# Patient Record
Sex: Male | Born: 1935 | Race: Black or African American | Hispanic: No | Marital: Married | Smoking: Former smoker
Health system: Southern US, Community
[De-identification: ages and names within clinical notes are randomized; demographics above are authoritative.]

## PROBLEM LIST (undated history)

## (undated) DIAGNOSIS — E785 Hyperlipidemia, unspecified: Secondary | ICD-10-CM

## (undated) DIAGNOSIS — E119 Type 2 diabetes mellitus without complications: Secondary | ICD-10-CM

## (undated) DIAGNOSIS — K5289 Other specified noninfective gastroenteritis and colitis: Secondary | ICD-10-CM

## (undated) DIAGNOSIS — Z85038 Personal history of other malignant neoplasm of large intestine: Secondary | ICD-10-CM

## (undated) DIAGNOSIS — K8020 Calculus of gallbladder without cholecystitis without obstruction: Secondary | ICD-10-CM

## (undated) DIAGNOSIS — R509 Fever, unspecified: Secondary | ICD-10-CM

## (undated) DIAGNOSIS — N259 Disorder resulting from impaired renal tubular function, unspecified: Secondary | ICD-10-CM

## (undated) DIAGNOSIS — Z87442 Personal history of urinary calculi: Secondary | ICD-10-CM

## (undated) DIAGNOSIS — R1011 Right upper quadrant pain: Secondary | ICD-10-CM

## (undated) DIAGNOSIS — R10816 Epigastric abdominal tenderness: Secondary | ICD-10-CM

## (undated) DIAGNOSIS — D649 Anemia, unspecified: Secondary | ICD-10-CM

## (undated) DIAGNOSIS — H9190 Unspecified hearing loss, unspecified ear: Secondary | ICD-10-CM

## (undated) DIAGNOSIS — K222 Esophageal obstruction: Secondary | ICD-10-CM

## (undated) DIAGNOSIS — J984 Other disorders of lung: Secondary | ICD-10-CM

## (undated) DIAGNOSIS — I10 Essential (primary) hypertension: Secondary | ICD-10-CM

## (undated) DIAGNOSIS — K5900 Constipation, unspecified: Secondary | ICD-10-CM

## (undated) DIAGNOSIS — K219 Gastro-esophageal reflux disease without esophagitis: Secondary | ICD-10-CM

## (undated) DIAGNOSIS — M1710 Unilateral primary osteoarthritis, unspecified knee: Secondary | ICD-10-CM

## (undated) HISTORY — DX: Essential (primary) hypertension: I10

## (undated) HISTORY — DX: Hyperlipidemia, unspecified: E78.5

## (undated) HISTORY — DX: Epigastric abdominal tenderness: R10.816

## (undated) HISTORY — DX: Unspecified hearing loss, unspecified ear: H91.90

## (undated) HISTORY — DX: Esophageal obstruction: K22.2

## (undated) HISTORY — DX: Other specified noninfective gastroenteritis and colitis: K52.89

## (undated) HISTORY — DX: Unilateral primary osteoarthritis, unspecified knee: M17.10

## (undated) HISTORY — DX: Calculus of gallbladder without cholecystitis without obstruction: K80.20

## (undated) HISTORY — DX: Other disorders of lung: J98.4

## (undated) HISTORY — DX: Fever, unspecified: R50.9

## (undated) HISTORY — DX: Constipation, unspecified: K59.00

## (undated) HISTORY — DX: Type 2 diabetes mellitus without complications: E11.9

## (undated) HISTORY — PX: SMALL INTESTINE SURGERY: SHX150

## (undated) HISTORY — DX: Disorder resulting from impaired renal tubular function, unspecified: N25.9

## (undated) HISTORY — DX: Personal history of urinary calculi: Z87.442

## (undated) HISTORY — DX: Personal history of other malignant neoplasm of large intestine: Z85.038

## (undated) HISTORY — DX: Right upper quadrant pain: R10.11

## (undated) HISTORY — DX: Anemia, unspecified: D64.9

## (undated) HISTORY — DX: Gastro-esophageal reflux disease without esophagitis: K21.9

---

## 1997-12-25 ENCOUNTER — Emergency Department (HOSPITAL_COMMUNITY): Admission: EM | Admit: 1997-12-25 | Discharge: 1997-12-25 | Payer: Self-pay | Admitting: Emergency Medicine

## 1998-03-14 ENCOUNTER — Other Ambulatory Visit: Admission: RE | Admit: 1998-03-14 | Discharge: 1998-03-14 | Payer: Self-pay | Admitting: Family Medicine

## 1998-09-05 ENCOUNTER — Encounter: Payer: Self-pay | Admitting: Family Medicine

## 1998-09-05 ENCOUNTER — Ambulatory Visit (HOSPITAL_COMMUNITY): Admission: RE | Admit: 1998-09-05 | Discharge: 1998-09-05 | Payer: Self-pay | Admitting: Family Medicine

## 1998-09-28 ENCOUNTER — Inpatient Hospital Stay (HOSPITAL_COMMUNITY): Admission: EM | Admit: 1998-09-28 | Discharge: 1998-10-05 | Payer: Self-pay | Admitting: Gastroenterology

## 1998-09-28 ENCOUNTER — Encounter: Payer: Self-pay | Admitting: Gastroenterology

## 1998-10-11 ENCOUNTER — Encounter: Payer: Self-pay | Admitting: Emergency Medicine

## 1998-10-12 ENCOUNTER — Inpatient Hospital Stay (HOSPITAL_COMMUNITY): Admission: EM | Admit: 1998-10-12 | Discharge: 1998-10-14 | Payer: Self-pay | Admitting: Emergency Medicine

## 1998-10-13 ENCOUNTER — Encounter: Payer: Self-pay | Admitting: Neurology

## 1998-10-17 ENCOUNTER — Encounter (HOSPITAL_BASED_OUTPATIENT_CLINIC_OR_DEPARTMENT_OTHER): Payer: Self-pay | Admitting: General Surgery

## 1998-10-17 ENCOUNTER — Inpatient Hospital Stay (HOSPITAL_COMMUNITY): Admission: EM | Admit: 1998-10-17 | Discharge: 1998-10-21 | Payer: Self-pay | Admitting: *Deleted

## 1998-12-07 ENCOUNTER — Ambulatory Visit (HOSPITAL_COMMUNITY): Admission: RE | Admit: 1998-12-07 | Discharge: 1998-12-07 | Payer: Self-pay | Admitting: General Surgery

## 1998-12-07 ENCOUNTER — Encounter (HOSPITAL_BASED_OUTPATIENT_CLINIC_OR_DEPARTMENT_OTHER): Payer: Self-pay | Admitting: General Surgery

## 1999-09-28 ENCOUNTER — Ambulatory Visit (HOSPITAL_COMMUNITY): Admission: RE | Admit: 1999-09-28 | Discharge: 1999-09-28 | Payer: Self-pay | Admitting: General Surgery

## 2000-01-29 ENCOUNTER — Emergency Department (HOSPITAL_COMMUNITY): Admission: EM | Admit: 2000-01-29 | Discharge: 2000-01-29 | Payer: Self-pay | Admitting: Emergency Medicine

## 2000-02-19 ENCOUNTER — Ambulatory Visit (HOSPITAL_COMMUNITY): Admission: RE | Admit: 2000-02-19 | Discharge: 2000-02-19 | Payer: Self-pay | Admitting: Gastroenterology

## 2000-02-19 ENCOUNTER — Encounter (INDEPENDENT_AMBULATORY_CARE_PROVIDER_SITE_OTHER): Payer: Self-pay | Admitting: *Deleted

## 2001-06-29 ENCOUNTER — Emergency Department (HOSPITAL_COMMUNITY): Admission: EM | Admit: 2001-06-29 | Discharge: 2001-06-29 | Payer: Self-pay

## 2002-08-27 ENCOUNTER — Ambulatory Visit (HOSPITAL_COMMUNITY): Admission: RE | Admit: 2002-08-27 | Discharge: 2002-08-27 | Payer: Self-pay | Admitting: Family Medicine

## 2002-08-27 ENCOUNTER — Encounter: Payer: Self-pay | Admitting: Family Medicine

## 2003-04-05 ENCOUNTER — Ambulatory Visit (HOSPITAL_COMMUNITY): Admission: RE | Admit: 2003-04-05 | Discharge: 2003-04-05 | Payer: Self-pay | Admitting: Gastroenterology

## 2003-07-16 ENCOUNTER — Ambulatory Visit (HOSPITAL_COMMUNITY): Admission: RE | Admit: 2003-07-16 | Discharge: 2003-07-16 | Payer: Self-pay | Admitting: Oncology

## 2003-07-18 ENCOUNTER — Emergency Department (HOSPITAL_COMMUNITY): Admission: EM | Admit: 2003-07-18 | Discharge: 2003-07-18 | Payer: Self-pay | Admitting: Emergency Medicine

## 2003-10-31 ENCOUNTER — Inpatient Hospital Stay (HOSPITAL_COMMUNITY): Admission: EM | Admit: 2003-10-31 | Discharge: 2003-11-01 | Payer: Self-pay | Admitting: Emergency Medicine

## 2005-04-10 ENCOUNTER — Emergency Department (HOSPITAL_COMMUNITY): Admission: EM | Admit: 2005-04-10 | Discharge: 2005-04-10 | Payer: Self-pay | Admitting: Emergency Medicine

## 2006-02-17 ENCOUNTER — Emergency Department (HOSPITAL_COMMUNITY): Admission: EM | Admit: 2006-02-17 | Discharge: 2006-02-17 | Payer: Self-pay | Admitting: Family Medicine

## 2007-01-09 ENCOUNTER — Emergency Department (HOSPITAL_COMMUNITY): Admission: EM | Admit: 2007-01-09 | Discharge: 2007-01-09 | Payer: Self-pay | Admitting: Emergency Medicine

## 2007-02-20 ENCOUNTER — Ambulatory Visit: Payer: Self-pay | Admitting: Internal Medicine

## 2007-02-20 LAB — CONVERTED CEMR LAB
ALT: 10 units/L (ref 0–53)
Alkaline Phosphatase: 91 units/L (ref 39–117)
BUN: 27 mg/dL — ABNORMAL HIGH (ref 6–23)
Basophils Relative: 0.1 % (ref 0.0–1.0)
Bilirubin, Direct: 0.2 mg/dL (ref 0.0–0.3)
CO2: 28 meq/L (ref 19–32)
Creatinine, Ser: 1.7 mg/dL — ABNORMAL HIGH (ref 0.4–1.5)
Eosinophils Relative: 2.2 % (ref 0.0–5.0)
Glucose, Bld: 127 mg/dL — ABNORMAL HIGH (ref 70–99)
HCT: 40.2 % (ref 39.0–52.0)
HDL: 42 mg/dL (ref >39.0)
Hemoglobin: 13.1 g/dL (ref 13.0–17.0)
LDL Cholesterol: 130 mg/dL — ABNORMAL HIGH (ref 0–99)
Lymphocytes Relative: 25.2 % (ref 12.0–46.0)
Monocytes Absolute: 0.6 10*3/uL (ref 0.2–0.7)
Monocytes Relative: 12 % — ABNORMAL HIGH (ref 3.0–11.0)
Neutro Abs: 3 10*3/uL (ref 1.4–7.7)
Potassium: 4.1 meq/L (ref 3.5–5.1)
RDW: 12.2 % (ref 11.5–14.6)
Total Bilirubin: 0.9 mg/dL (ref 0.3–1.2)
Total Protein: 8.7 g/dL — ABNORMAL HIGH (ref 6.0–8.3)
VLDL: 15 mg/dL (ref 0–40)
WBC: 5 10*3/uL (ref 4.5–10.5)

## 2007-03-24 ENCOUNTER — Ambulatory Visit: Payer: Self-pay | Admitting: Internal Medicine

## 2007-03-24 DIAGNOSIS — E785 Hyperlipidemia, unspecified: Secondary | ICD-10-CM

## 2007-03-24 DIAGNOSIS — I10 Essential (primary) hypertension: Secondary | ICD-10-CM | POA: Insufficient documentation

## 2007-03-24 DIAGNOSIS — Z85038 Personal history of other malignant neoplasm of large intestine: Secondary | ICD-10-CM | POA: Insufficient documentation

## 2007-03-24 HISTORY — DX: Hyperlipidemia, unspecified: E78.5

## 2007-03-24 HISTORY — DX: Essential (primary) hypertension: I10

## 2007-03-24 HISTORY — DX: Personal history of other malignant neoplasm of large intestine: Z85.038

## 2007-03-24 LAB — CONVERTED CEMR LAB
Chloride: 103 meq/L (ref 96–112)
Creatinine, Ser: 1.7 mg/dL — ABNORMAL HIGH (ref 0.4–1.5)
Glucose, Bld: 129 mg/dL — ABNORMAL HIGH (ref 70–99)
Phosphorus: 2.5 mg/dL (ref 2.3–4.6)
Sodium: 141 meq/L (ref 135–145)

## 2007-05-15 ENCOUNTER — Ambulatory Visit: Payer: Self-pay | Admitting: Internal Medicine

## 2007-05-15 ENCOUNTER — Inpatient Hospital Stay (HOSPITAL_COMMUNITY): Admission: EM | Admit: 2007-05-15 | Discharge: 2007-05-28 | Payer: Self-pay | Admitting: Emergency Medicine

## 2007-05-16 ENCOUNTER — Ambulatory Visit: Payer: Self-pay | Admitting: Oncology

## 2007-05-23 ENCOUNTER — Encounter (INDEPENDENT_AMBULATORY_CARE_PROVIDER_SITE_OTHER): Payer: Self-pay | Admitting: Gastroenterology

## 2007-06-02 ENCOUNTER — Encounter: Payer: Self-pay | Admitting: Internal Medicine

## 2007-06-02 ENCOUNTER — Ambulatory Visit: Payer: Self-pay | Admitting: Internal Medicine

## 2007-06-02 DIAGNOSIS — K222 Esophageal obstruction: Secondary | ICD-10-CM

## 2007-06-02 DIAGNOSIS — J984 Other disorders of lung: Secondary | ICD-10-CM

## 2007-06-02 DIAGNOSIS — K219 Gastro-esophageal reflux disease without esophagitis: Secondary | ICD-10-CM | POA: Insufficient documentation

## 2007-06-02 DIAGNOSIS — D649 Anemia, unspecified: Secondary | ICD-10-CM

## 2007-06-02 DIAGNOSIS — K8020 Calculus of gallbladder without cholecystitis without obstruction: Secondary | ICD-10-CM | POA: Insufficient documentation

## 2007-06-02 DIAGNOSIS — N259 Disorder resulting from impaired renal tubular function, unspecified: Secondary | ICD-10-CM | POA: Insufficient documentation

## 2007-06-02 DIAGNOSIS — Z87442 Personal history of urinary calculi: Secondary | ICD-10-CM

## 2007-06-02 HISTORY — DX: Esophageal obstruction: K22.2

## 2007-06-02 HISTORY — DX: Calculus of gallbladder without cholecystitis without obstruction: K80.20

## 2007-06-02 HISTORY — DX: Gastro-esophageal reflux disease without esophagitis: K21.9

## 2007-06-02 HISTORY — DX: Disorder resulting from impaired renal tubular function, unspecified: N25.9

## 2007-06-02 HISTORY — DX: Personal history of urinary calculi: Z87.442

## 2007-06-02 HISTORY — DX: Anemia, unspecified: D64.9

## 2007-06-02 HISTORY — DX: Other disorders of lung: J98.4

## 2007-06-05 ENCOUNTER — Ambulatory Visit: Payer: Self-pay | Admitting: Internal Medicine

## 2007-06-12 ENCOUNTER — Encounter: Payer: Self-pay | Admitting: Internal Medicine

## 2007-06-12 ENCOUNTER — Ambulatory Visit: Payer: Self-pay | Admitting: Internal Medicine

## 2007-06-12 DIAGNOSIS — R1011 Right upper quadrant pain: Secondary | ICD-10-CM

## 2007-06-12 HISTORY — DX: Right upper quadrant pain: R10.11

## 2007-06-17 ENCOUNTER — Encounter: Admission: RE | Admit: 2007-06-17 | Discharge: 2007-06-17 | Payer: Self-pay | Admitting: Internal Medicine

## 2007-06-23 ENCOUNTER — Ambulatory Visit: Payer: Self-pay | Admitting: Oncology

## 2007-06-24 ENCOUNTER — Encounter: Payer: Self-pay | Admitting: Internal Medicine

## 2007-06-24 LAB — CBC WITH DIFFERENTIAL/PLATELET
Basophils Absolute: 0 10*3/uL (ref 0.0–0.1)
EOS%: 2.8 % (ref 0.0–7.0)
HCT: 33.6 % — ABNORMAL LOW (ref 38.7–49.9)
HGB: 11.5 g/dL — ABNORMAL LOW (ref 13.0–17.1)
LYMPH%: 11.9 % — ABNORMAL LOW (ref 14.0–48.0)
MCH: 31.5 pg (ref 28.0–33.4)
MCV: 92.3 fL (ref 81.6–98.0)
MONO%: 9.9 % (ref 0.0–13.0)
NEUT%: 75.1 % — ABNORMAL HIGH (ref 40.0–75.0)
Platelets: 349 10*3/uL (ref 145–400)
RDW: 13.2 % (ref 11.2–14.6)

## 2007-06-24 LAB — COMPREHENSIVE METABOLIC PANEL
ALT: <8 U/L (ref 0–53)
AST: 18 U/L (ref 0–37)
Albumin: 4.3 g/dL (ref 3.5–5.2)
BUN: 35 mg/dL — ABNORMAL HIGH (ref 6–23)
CO2: 25 mEq/L (ref 19–32)
Calcium: 9.8 mg/dL (ref 8.4–10.5)
Chloride: 99 mEq/L (ref 96–112)
Creatinine, Ser: 1.62 mg/dL — ABNORMAL HIGH (ref 0.40–1.50)
Potassium: 4 mEq/L (ref 3.5–5.3)

## 2007-06-24 LAB — CEA: CEA: 3.9 ng/mL (ref 0.0–5.0)

## 2007-06-25 ENCOUNTER — Ambulatory Visit (HOSPITAL_COMMUNITY): Admission: RE | Admit: 2007-06-25 | Discharge: 2007-06-25 | Payer: Self-pay | Admitting: Oncology

## 2007-06-30 ENCOUNTER — Encounter: Payer: Self-pay | Admitting: Internal Medicine

## 2007-07-21 ENCOUNTER — Ambulatory Visit: Payer: Self-pay | Admitting: Internal Medicine

## 2007-07-21 LAB — CONVERTED CEMR LAB
AST: 17 units/L (ref 0–37)
Basophils Absolute: 0.1 10*3/uL (ref 0.0–0.1)
CEA: 0.9 ng/mL (ref 0.0–5.0)
CO2: 33 meq/L — ABNORMAL HIGH (ref 19–32)
Chloride: 99 meq/L (ref 96–112)
Creatinine, Ser: 1.6 mg/dL — ABNORMAL HIGH (ref 0.4–1.5)
Eosinophils Absolute: 0.1 10*3/uL (ref 0.0–0.6)
GFR calc Af Amer: 54 mL/min
Glucose, Bld: 125 mg/dL — ABNORMAL HIGH (ref 70–99)
HCT: 34.1 % — ABNORMAL LOW (ref 39.0–52.0)
Hemoglobin: 11.2 g/dL — ABNORMAL LOW (ref 13.0–17.0)
Lymphocytes Relative: 19.2 % (ref 12.0–46.0)
MCHC: 32.9 g/dL (ref 30.0–36.0)
MCV: 94.2 fL (ref 78.0–100.0)
Monocytes Absolute: 0.7 10*3/uL (ref 0.2–0.7)
Neutro Abs: 3.8 10*3/uL (ref 1.4–7.7)
Neutrophils Relative %: 66.6 % (ref 43.0–77.0)
Sodium: 138 meq/L (ref 135–145)

## 2007-07-23 ENCOUNTER — Encounter: Payer: Self-pay | Admitting: Internal Medicine

## 2007-08-12 ENCOUNTER — Ambulatory Visit: Payer: Self-pay | Admitting: Oncology

## 2007-08-18 ENCOUNTER — Encounter: Payer: Self-pay | Admitting: Internal Medicine

## 2007-08-18 LAB — CBC WITH DIFFERENTIAL/PLATELET
BASO%: 0 % (ref 0.0–2.0)
EOS%: 5.2 % (ref 0.0–7.0)
LYMPH%: 28.6 % (ref 14.0–48.0)
MCHC: 33.4 g/dL (ref 32.0–35.9)
MCV: 93 fL (ref 81.6–98.0)
MONO%: 12.1 % (ref 0.0–13.0)
NEUT#: 2.5 10*3/uL (ref 1.5–6.5)
Platelets: 326 10*3/uL (ref 145–400)
RBC: 3.46 10*6/uL — ABNORMAL LOW (ref 4.20–5.71)
RDW: 13 % (ref 11.2–14.6)

## 2007-08-18 LAB — COMPREHENSIVE METABOLIC PANEL
ALT: <8 U/L (ref 0–53)
AST: 14 U/L (ref 0–37)
Albumin: 4 g/dL (ref 3.5–5.2)
Alkaline Phosphatase: 57 U/L (ref 39–117)
Potassium: 4.1 mEq/L (ref 3.5–5.3)
Sodium: 139 mEq/L (ref 135–145)
Total Bilirubin: 0.5 mg/dL (ref 0.3–1.2)
Total Protein: 8.6 g/dL — ABNORMAL HIGH (ref 6.0–8.3)

## 2007-09-10 ENCOUNTER — Encounter: Payer: Self-pay | Admitting: Internal Medicine

## 2007-09-10 LAB — COMPREHENSIVE METABOLIC PANEL
Alkaline Phosphatase: 74 U/L (ref 39–117)
BUN: 14 mg/dL (ref 6–23)
CO2: 25 mEq/L (ref 19–32)
Creatinine, Ser: 1.4 mg/dL (ref 0.40–1.50)
Glucose, Bld: 105 mg/dL — ABNORMAL HIGH (ref 70–99)
Total Bilirubin: 0.7 mg/dL (ref 0.3–1.2)

## 2007-09-10 LAB — CBC WITH DIFFERENTIAL/PLATELET
Basophils Absolute: 0.1 10*3/uL (ref 0.0–0.1)
Eosinophils Absolute: 0.2 10*3/uL (ref 0.0–0.5)
LYMPH%: 30.1 % (ref 14.0–48.0)
MCH: 31.9 pg (ref 28.0–33.4)
MONO%: 12.8 % (ref 0.0–13.0)
NEUT%: 50.8 % (ref 40.0–75.0)
RBC: 3.61 10*6/uL — ABNORMAL LOW (ref 4.20–5.71)
WBC: 4.2 10*3/uL (ref 4.0–10.0)

## 2007-09-10 LAB — LACTATE DEHYDROGENASE: LDH: 199 U/L (ref 94–250)

## 2007-09-10 LAB — CEA: CEA: 0.5 ng/mL (ref 0.0–5.0)

## 2007-09-12 ENCOUNTER — Ambulatory Visit: Payer: Self-pay | Admitting: Internal Medicine

## 2007-09-22 ENCOUNTER — Ambulatory Visit: Payer: Self-pay | Admitting: Oncology

## 2007-09-24 LAB — CBC WITH DIFFERENTIAL/PLATELET
BASO%: 0.1 % (ref 0.0–2.0)
EOS%: 3.4 % (ref 0.0–7.0)
LYMPH%: 22.6 % (ref 14.0–48.0)
MCH: 31.9 pg (ref 28.0–33.4)
MCHC: 33.1 g/dL (ref 32.0–35.9)
MCV: 96.2 fL (ref 81.6–98.0)
MONO%: 12 % (ref 0.0–13.0)
Platelets: 268 10*3/uL (ref 145–400)
RBC: 3.61 10*6/uL — ABNORMAL LOW (ref 4.20–5.71)

## 2007-10-02 ENCOUNTER — Ambulatory Visit: Payer: Self-pay | Admitting: Internal Medicine

## 2007-10-02 LAB — CONVERTED CEMR LAB
BUN: 26 mg/dL — ABNORMAL HIGH (ref 6–23)
Basophils Relative: 0.3 % (ref 0.0–1.0)
Chloride: 104 meq/L (ref 96–112)
Creatinine, Ser: 1.8 mg/dL — ABNORMAL HIGH (ref 0.4–1.5)
Eosinophils Relative: 4.2 % (ref 0.0–5.0)
Folate: >20 ng/mL
Hgb A1c MFr Bld: 6.3 % — ABNORMAL HIGH (ref 4.6–6.0)
Iron: 75 ug/dL (ref 42–165)
Lymphocytes Relative: 29.8 % (ref 12.0–46.0)
Neutro Abs: 1.9 10*3/uL (ref 1.4–7.7)
Platelets: 215 10*3/uL (ref 150–400)
Transferrin: 170.6 mg/dL — ABNORMAL LOW (ref >=212.0)
WBC: 3.8 10*3/uL — ABNORMAL LOW (ref 4.5–10.5)

## 2007-10-10 ENCOUNTER — Encounter: Payer: Self-pay | Admitting: Internal Medicine

## 2007-10-10 LAB — COMPREHENSIVE METABOLIC PANEL
ALT: <8 U/L (ref 0–53)
CO2: 23 mEq/L (ref 19–32)
Sodium: 140 mEq/L (ref 135–145)
Total Bilirubin: 0.7 mg/dL (ref 0.3–1.2)
Total Protein: 8.4 g/dL — ABNORMAL HIGH (ref 6.0–8.3)

## 2007-10-10 LAB — CBC WITH DIFFERENTIAL/PLATELET
BASO%: 0.2 % (ref 0.0–2.0)
LYMPH%: 13.1 % — ABNORMAL LOW (ref 14.0–48.0)
MCHC: 34.2 g/dL (ref 32.0–35.9)
MONO#: 0.6 10*3/uL (ref 0.1–0.9)
Platelets: 215 10*3/uL (ref 145–400)
RBC: 3.41 10*6/uL — ABNORMAL LOW (ref 4.20–5.71)
WBC: 7.7 10*3/uL (ref 4.0–10.0)
lymph#: 1 10*3/uL (ref 0.9–3.3)

## 2007-10-10 LAB — LACTATE DEHYDROGENASE: LDH: 159 U/L (ref 94–250)

## 2007-10-20 ENCOUNTER — Encounter: Payer: Self-pay | Admitting: Internal Medicine

## 2007-10-21 ENCOUNTER — Ambulatory Visit (HOSPITAL_COMMUNITY): Admission: RE | Admit: 2007-10-21 | Discharge: 2007-10-21 | Payer: Self-pay | Admitting: Oncology

## 2007-11-14 ENCOUNTER — Ambulatory Visit: Payer: Self-pay | Admitting: Oncology

## 2007-12-02 LAB — CBC WITH DIFFERENTIAL/PLATELET
Basophils Absolute: 0 10*3/uL (ref 0.0–0.1)
Eosinophils Absolute: 0.2 10*3/uL (ref 0.0–0.5)
HCT: 34.6 % — ABNORMAL LOW (ref 38.7–49.9)
HGB: 11.6 g/dL — ABNORMAL LOW (ref 13.0–17.1)
LYMPH%: 24.1 % (ref 14.0–48.0)
MCV: 102.3 fL — ABNORMAL HIGH (ref 81.6–98.0)
MONO%: 12 % (ref 0.0–13.0)
NEUT#: 3.1 10*3/uL (ref 1.5–6.5)
NEUT%: 60.4 % (ref 40.0–75.0)
Platelets: 208 10*3/uL (ref 145–400)
RDW: 19.1 % — ABNORMAL HIGH (ref 11.2–14.6)

## 2007-12-15 LAB — CBC WITH DIFFERENTIAL/PLATELET
Eosinophils Absolute: 0.1 10*3/uL (ref 0.0–0.5)
LYMPH%: 24.2 % (ref 14.0–48.0)
MCV: 102.3 fL — ABNORMAL HIGH (ref 81.6–98.0)
MONO%: 14 % — ABNORMAL HIGH (ref 0.0–13.0)
NEUT#: 3 10*3/uL (ref 1.5–6.5)
Platelets: 197 10*3/uL (ref 145–400)
RBC: 3.31 10*6/uL — ABNORMAL LOW (ref 4.20–5.71)

## 2007-12-23 ENCOUNTER — Encounter: Payer: Self-pay | Admitting: Internal Medicine

## 2007-12-23 ENCOUNTER — Ambulatory Visit (HOSPITAL_COMMUNITY): Admission: RE | Admit: 2007-12-23 | Discharge: 2007-12-23 | Payer: Self-pay | Admitting: Oncology

## 2007-12-24 ENCOUNTER — Ambulatory Visit: Payer: Self-pay | Admitting: Internal Medicine

## 2007-12-24 DIAGNOSIS — E119 Type 2 diabetes mellitus without complications: Secondary | ICD-10-CM

## 2007-12-24 HISTORY — DX: Type 2 diabetes mellitus without complications: E11.9

## 2007-12-30 ENCOUNTER — Ambulatory Visit: Payer: Self-pay | Admitting: Oncology

## 2007-12-30 LAB — CBC WITH DIFFERENTIAL/PLATELET
Basophils Absolute: 0 10*3/uL (ref 0.0–0.1)
Eosinophils Absolute: 0.2 10*3/uL (ref 0.0–0.5)
HGB: 12.1 g/dL — ABNORMAL LOW (ref 13.0–17.1)
MONO#: 0.6 10*3/uL (ref 0.1–0.9)
NEUT#: 3.2 10*3/uL (ref 1.5–6.5)
RDW: 15.1 % — ABNORMAL HIGH (ref 11.2–14.6)
lymph#: 1.3 10*3/uL (ref 0.9–3.3)

## 2008-01-13 ENCOUNTER — Encounter: Payer: Self-pay | Admitting: Internal Medicine

## 2008-01-26 ENCOUNTER — Ambulatory Visit: Payer: Self-pay | Admitting: Internal Medicine

## 2008-01-26 DIAGNOSIS — M1710 Unilateral primary osteoarthritis, unspecified knee: Secondary | ICD-10-CM

## 2008-01-26 DIAGNOSIS — IMO0002 Reserved for concepts with insufficient information to code with codable children: Secondary | ICD-10-CM | POA: Insufficient documentation

## 2008-01-26 HISTORY — DX: Reserved for concepts with insufficient information to code with codable children: IMO0002

## 2008-01-26 LAB — CBC WITH DIFFERENTIAL/PLATELET
BASO%: 0.7 % (ref 0.0–2.0)
EOS%: 1.6 % (ref 0.0–7.0)
HCT: 35.9 % — ABNORMAL LOW (ref 38.7–49.9)
MCH: 33.7 pg — ABNORMAL HIGH (ref 28.0–33.4)
MCHC: 34.3 g/dL (ref 32.0–35.9)
NEUT%: 56.9 % (ref 40.0–75.0)
RBC: 3.65 10*6/uL — ABNORMAL LOW (ref 4.20–5.71)
WBC: 5.7 10*3/uL (ref 4.0–10.0)
lymph#: 1.6 10*3/uL (ref 0.9–3.3)

## 2008-02-10 LAB — CBC WITH DIFFERENTIAL/PLATELET
BASO%: 0.3 % (ref 0.0–2.0)
Basophils Absolute: 0 10*3/uL (ref 0.0–0.1)
EOS%: 1.8 % (ref 0.0–7.0)
HGB: 11.8 g/dL — ABNORMAL LOW (ref 13.0–17.1)
MCH: 34.1 pg — ABNORMAL HIGH (ref 28.0–33.4)
MCHC: 34.8 g/dL (ref 32.0–35.9)
MONO#: 0.6 10*3/uL (ref 0.1–0.9)
RDW: 14.7 % — ABNORMAL HIGH (ref 11.2–14.6)
WBC: 4.6 10*3/uL (ref 4.0–10.0)
lymph#: 1.4 10*3/uL (ref 0.9–3.3)

## 2008-02-18 ENCOUNTER — Ambulatory Visit: Payer: Self-pay | Admitting: Oncology

## 2008-02-23 ENCOUNTER — Encounter: Payer: Self-pay | Admitting: Internal Medicine

## 2008-02-23 LAB — CBC WITH DIFFERENTIAL/PLATELET
Eosinophils Absolute: 0.1 10*3/uL (ref 0.0–0.5)
HCT: 34.7 % — ABNORMAL LOW (ref 38.7–49.9)
LYMPH%: 19.8 % (ref 14.0–48.0)
MCV: 98.6 fL — ABNORMAL HIGH (ref 81.6–98.0)
MONO%: 11.2 % (ref 0.0–13.0)
NEUT#: 3.6 10*3/uL (ref 1.5–6.5)
NEUT%: 67.2 % (ref 40.0–75.0)
Platelets: 236 10*3/uL (ref 145–400)
RBC: 3.52 10*6/uL — ABNORMAL LOW (ref 4.20–5.71)

## 2008-02-23 LAB — COMPREHENSIVE METABOLIC PANEL
Alkaline Phosphatase: 74 U/L (ref 39–117)
BUN: 28 mg/dL — ABNORMAL HIGH (ref 6–23)
CO2: 23 mEq/L (ref 19–32)
Creatinine, Ser: 2.06 mg/dL — ABNORMAL HIGH (ref 0.40–1.50)
Glucose, Bld: 124 mg/dL — ABNORMAL HIGH (ref 70–99)
Sodium: 137 mEq/L (ref 135–145)
Total Bilirubin: 0.6 mg/dL (ref 0.3–1.2)
Total Protein: 8.3 g/dL (ref 6.0–8.3)

## 2008-02-23 LAB — LACTATE DEHYDROGENASE: LDH: 149 U/L (ref 94–250)

## 2008-02-24 ENCOUNTER — Ambulatory Visit (HOSPITAL_COMMUNITY): Admission: RE | Admit: 2008-02-24 | Discharge: 2008-02-24 | Payer: Self-pay | Admitting: Oncology

## 2008-03-08 LAB — CBC WITH DIFFERENTIAL/PLATELET
BASO%: 0.3 % (ref 0.0–2.0)
Basophils Absolute: 0 10*3/uL (ref 0.0–0.1)
Eosinophils Absolute: 0.1 10*3/uL (ref 0.0–0.5)
HCT: 33.4 % — ABNORMAL LOW (ref 38.7–49.9)
HGB: 11.3 g/dL — ABNORMAL LOW (ref 13.0–17.1)
LYMPH%: 26.4 % (ref 14.0–48.0)
MONO#: 0.7 10*3/uL (ref 0.1–0.9)
NEUT#: 2.9 10*3/uL (ref 1.5–6.5)
NEUT%: 58.5 % (ref 40.0–75.0)
Platelets: 200 10*3/uL (ref 145–400)
WBC: 4.9 10*3/uL (ref 4.0–10.0)
lymph#: 1.3 10*3/uL (ref 0.9–3.3)

## 2008-03-22 LAB — CBC WITH DIFFERENTIAL/PLATELET
BASO%: 0.2 % (ref 0.0–2.0)
EOS%: 3.4 % (ref 0.0–7.0)
MCH: 34.1 pg — ABNORMAL HIGH (ref 28.0–33.4)
MCV: 101.3 fL — ABNORMAL HIGH (ref 81.6–98.0)
MONO%: 14.6 % — ABNORMAL HIGH (ref 0.0–13.0)
NEUT#: 3.1 10*3/uL (ref 1.5–6.5)
RBC: 3.15 10*6/uL — ABNORMAL LOW (ref 4.20–5.71)
RDW: 15.9 % — ABNORMAL HIGH (ref 11.2–14.6)

## 2008-04-04 ENCOUNTER — Ambulatory Visit: Payer: Self-pay | Admitting: Oncology

## 2008-04-05 ENCOUNTER — Encounter: Payer: Self-pay | Admitting: Internal Medicine

## 2008-04-05 LAB — CBC WITH DIFFERENTIAL/PLATELET
Eosinophils Absolute: 0.1 10*3/uL (ref 0.0–0.5)
MONO#: 0.6 10*3/uL (ref 0.1–0.9)
MONO%: 14 % — ABNORMAL HIGH (ref 0.0–13.0)
NEUT#: 2.6 10*3/uL (ref 1.5–6.5)
RBC: 3.03 10*6/uL — ABNORMAL LOW (ref 4.20–5.71)
RDW: 17.8 % — ABNORMAL HIGH (ref 11.2–14.6)
WBC: 4.4 10*3/uL (ref 4.0–10.0)

## 2008-04-05 LAB — COMPREHENSIVE METABOLIC PANEL
ALT: <8 U/L (ref 0–53)
AST: 15 U/L (ref 0–37)
BUN: 27 mg/dL — ABNORMAL HIGH (ref 6–23)
CO2: 23 mEq/L (ref 19–32)
Calcium: 8.6 mg/dL (ref 8.4–10.5)
Creatinine, Ser: 2.24 mg/dL — ABNORMAL HIGH (ref 0.40–1.50)
Glucose, Bld: 134 mg/dL — ABNORMAL HIGH (ref 70–99)
Potassium: 3.8 mEq/L (ref 3.5–5.3)
Sodium: 139 mEq/L (ref 135–145)
Total Bilirubin: 0.7 mg/dL (ref 0.3–1.2)

## 2008-04-05 LAB — LACTATE DEHYDROGENASE: LDH: 146 U/L (ref 94–250)

## 2008-04-19 LAB — CBC WITH DIFFERENTIAL/PLATELET
Basophils Absolute: 0 10*3/uL (ref 0.0–0.1)
EOS%: 3.2 % (ref 0.0–7.0)
Eosinophils Absolute: 0.1 10*3/uL (ref 0.0–0.5)
HGB: 10.5 g/dL — ABNORMAL LOW (ref 13.0–17.1)
NEUT#: 2.7 10*3/uL (ref 1.5–6.5)
RDW: 19.4 % — ABNORMAL HIGH (ref 11.2–14.6)
lymph#: 1 10*3/uL (ref 0.9–3.3)

## 2008-04-19 LAB — BASIC METABOLIC PANEL
BUN: 33 mg/dL — ABNORMAL HIGH (ref 6–23)
Chloride: 103 mEq/L (ref 96–112)
Glucose, Bld: 115 mg/dL — ABNORMAL HIGH (ref 70–99)
Potassium: 4 mEq/L (ref 3.5–5.3)
Sodium: 139 mEq/L (ref 135–145)

## 2008-05-03 LAB — CBC WITH DIFFERENTIAL/PLATELET
Eosinophils Absolute: 0.1 10*3/uL (ref 0.0–0.5)
MONO#: 0.7 10*3/uL (ref 0.1–0.9)
MONO%: 14.4 % — ABNORMAL HIGH (ref 0.0–13.0)
NEUT#: 3.1 10*3/uL (ref 1.5–6.5)
RBC: 2.88 10*6/uL — ABNORMAL LOW (ref 4.20–5.71)
RDW: 21.4 % — ABNORMAL HIGH (ref 11.2–14.6)
WBC: 5 10*3/uL (ref 4.0–10.0)
lymph#: 1.1 10*3/uL (ref 0.9–3.3)

## 2008-05-17 ENCOUNTER — Encounter: Payer: Self-pay | Admitting: Internal Medicine

## 2008-05-17 LAB — CBC WITH DIFFERENTIAL/PLATELET
Eosinophils Absolute: 0.1 10*3/uL (ref 0.0–0.5)
HCT: 30.7 % — ABNORMAL LOW (ref 38.7–49.9)
HGB: 10.3 g/dL — ABNORMAL LOW (ref 13.0–17.1)
LYMPH%: 27.7 % (ref 14.0–48.0)
MONO#: 0.5 10*3/uL (ref 0.1–0.9)
NEUT#: 2.4 10*3/uL (ref 1.5–6.5)
Platelets: 247 10*3/uL (ref 145–400)
RBC: 2.84 10*6/uL — ABNORMAL LOW (ref 4.20–5.71)
WBC: 4.2 10*3/uL (ref 4.0–10.0)

## 2008-05-17 LAB — COMPREHENSIVE METABOLIC PANEL
Albumin: 4.6 g/dL (ref 3.5–5.2)
CO2: 25 mEq/L (ref 19–32)
Glucose, Bld: 134 mg/dL — ABNORMAL HIGH (ref 70–99)
Potassium: 3.9 mEq/L (ref 3.5–5.3)
Sodium: 139 mEq/L (ref 135–145)
Total Bilirubin: 0.8 mg/dL (ref 0.3–1.2)
Total Protein: 8.2 g/dL (ref 6.0–8.3)

## 2008-05-27 ENCOUNTER — Ambulatory Visit: Payer: Self-pay | Admitting: Oncology

## 2008-05-31 LAB — CBC WITH DIFFERENTIAL/PLATELET
Eosinophils Absolute: 0.1 10*3/uL (ref 0.0–0.5)
LYMPH%: 24.4 % (ref 14.0–48.0)
MONO#: 0.5 10*3/uL (ref 0.1–0.9)
NEUT#: 2.8 10*3/uL (ref 1.5–6.5)
Platelets: 285 10*3/uL (ref 145–400)
RBC: 2.81 10*6/uL — ABNORMAL LOW (ref 4.20–5.71)
WBC: 4.6 10*3/uL (ref 4.0–10.0)
lymph#: 1.1 10*3/uL (ref 0.9–3.3)

## 2008-06-14 LAB — CBC WITH DIFFERENTIAL/PLATELET
BASO%: 0.5 % (ref 0.0–2.0)
EOS%: 3.4 % (ref 0.0–7.0)
HCT: 31.1 % — ABNORMAL LOW (ref 38.7–49.9)
HGB: 10.4 g/dL — ABNORMAL LOW (ref 13.0–17.1)
MCHC: 33.4 g/dL (ref 32.0–35.9)
MONO#: 0.7 10*3/uL (ref 0.1–0.9)
MONO%: 12.4 % (ref 0.0–13.0)
NEUT#: 3.7 10*3/uL (ref 1.5–6.5)
Platelets: 231 10*3/uL (ref 145–400)
WBC: 5.8 10*3/uL (ref 4.0–10.0)
lymph#: 1.2 10*3/uL (ref 0.9–3.3)

## 2008-06-22 ENCOUNTER — Ambulatory Visit (HOSPITAL_COMMUNITY): Admission: RE | Admit: 2008-06-22 | Discharge: 2008-06-22 | Payer: Self-pay | Admitting: Oncology

## 2008-06-28 ENCOUNTER — Encounter: Payer: Self-pay | Admitting: Internal Medicine

## 2008-06-28 LAB — CBC WITH DIFFERENTIAL/PLATELET
Basophils Absolute: 0 10*3/uL (ref 0.0–0.1)
Eosinophils Absolute: 0.2 10*3/uL (ref 0.0–0.5)
HCT: 30.3 % — ABNORMAL LOW (ref 38.7–49.9)
LYMPH%: 26.8 % (ref 14.0–48.0)
MCV: 112.1 fL — ABNORMAL HIGH (ref 81.6–98.0)
MONO#: 0.6 10*3/uL (ref 0.1–0.9)
NEUT#: 2.3 10*3/uL (ref 1.5–6.5)
NEUT%: 55.5 % (ref 40.0–75.0)
Platelets: 231 10*3/uL (ref 145–400)
WBC: 4.2 10*3/uL (ref 4.0–10.0)

## 2008-06-28 LAB — COMPREHENSIVE METABOLIC PANEL
BUN: 26 mg/dL — ABNORMAL HIGH (ref 6–23)
CO2: 27 mEq/L (ref 19–32)
Creatinine, Ser: 1.92 mg/dL — ABNORMAL HIGH (ref 0.40–1.50)
Glucose, Bld: 105 mg/dL — ABNORMAL HIGH (ref 70–99)
Sodium: 138 mEq/L (ref 135–145)
Total Bilirubin: 0.5 mg/dL (ref 0.3–1.2)
Total Protein: 7.7 g/dL (ref 6.0–8.3)

## 2008-06-28 LAB — LACTATE DEHYDROGENASE: LDH: 151 U/L (ref 94–250)

## 2008-06-28 LAB — CEA: CEA: 1.4 ng/mL (ref 0.0–5.0)

## 2008-07-12 ENCOUNTER — Ambulatory Visit: Payer: Self-pay | Admitting: Oncology

## 2008-07-12 LAB — CBC WITH DIFFERENTIAL/PLATELET
BASO%: 0.2 % (ref 0.0–2.0)
Eosinophils Absolute: 0.1 10*3/uL (ref 0.0–0.5)
MONO#: 0.6 10*3/uL (ref 0.1–0.9)
MONO%: 12.5 % (ref 0.0–13.0)
NEUT#: 2.5 10*3/uL (ref 1.5–6.5)
RBC: 2.95 10*6/uL — ABNORMAL LOW (ref 4.20–5.71)
RDW: 17.8 % — ABNORMAL HIGH (ref 11.2–14.6)
WBC: 4.5 10*3/uL (ref 4.0–10.0)

## 2008-07-26 ENCOUNTER — Encounter: Payer: Self-pay | Admitting: Internal Medicine

## 2008-07-28 ENCOUNTER — Ambulatory Visit: Payer: Self-pay | Admitting: Internal Medicine

## 2008-07-30 LAB — CONVERTED CEMR LAB
ALT: 9 units/L (ref 0–53)
AST: 19 units/L (ref 0–37)
Albumin: 4.1 g/dL (ref 3.5–5.2)
Alkaline Phosphatase: 65 units/L (ref 39–117)
BUN: 22 mg/dL (ref 6–23)
Basophils Absolute: 0 10*3/uL (ref 0.0–0.1)
Basophils Relative: 0.1 % (ref 0.0–3.0)
Bilirubin Urine: NEGATIVE
Bilirubin, Direct: 0.1 mg/dL (ref 0.0–0.3)
CO2: 28 meq/L (ref 19–32)
Calcium: 9.3 mg/dL (ref 8.4–10.5)
Chloride: 108 meq/L (ref 96–112)
Cholesterol: 100 mg/dL (ref 0–200)
Creatinine, Ser: 1.9 mg/dL — ABNORMAL HIGH (ref 0.4–1.5)
Eosinophils Absolute: 0.1 10*3/uL (ref 0.0–0.7)
Eosinophils Relative: 1.9 % (ref 0.0–5.0)
Folate: >20 ng/mL
GFR calc Af Amer: 44 mL/min
GFR calc non Af Amer: 36 mL/min
Glucose, Bld: 125 mg/dL — ABNORMAL HIGH (ref 70–99)
HCT: 32.5 % — ABNORMAL LOW (ref 39.0–52.0)
HDL: 32.7 mg/dL — ABNORMAL LOW (ref >39.0)
Hemoglobin, Urine: NEGATIVE
Hemoglobin: 10.9 g/dL — ABNORMAL LOW (ref 13.0–17.0)
Hgb A1c MFr Bld: 6 % (ref 4.6–6.0)
Iron: 92 ug/dL (ref 42–165)
Ketones, ur: NEGATIVE mg/dL
LDL Cholesterol: 55 mg/dL (ref 0–99)
Leukocytes, UA: NEGATIVE
Lymphocytes Relative: 23 % (ref 12.0–46.0)
MCHC: 33.6 g/dL (ref 30.0–36.0)
MCV: 111.8 fL — ABNORMAL HIGH (ref 78.0–100.0)
Monocytes Absolute: 0.7 10*3/uL (ref 0.1–1.0)
Monocytes Relative: 14.5 % — ABNORMAL HIGH (ref 3.0–12.0)
Neutro Abs: 2.7 10*3/uL (ref 1.4–7.7)
Neutrophils Relative %: 60.5 % (ref 43.0–77.0)
Nitrite: NEGATIVE
Platelets: 184 10*3/uL (ref 150–400)
Potassium: 4.6 meq/L (ref 3.5–5.1)
RBC: 2.91 M/uL — ABNORMAL LOW (ref 4.22–5.81)
RDW: 17.4 % — ABNORMAL HIGH (ref 11.5–14.6)
Saturation Ratios: 35.1 % (ref 20.0–50.0)
Sodium: 142 meq/L (ref 135–145)
Specific Gravity, Urine: 1.015 (ref 1.000–1.03)
TSH: 3.27 microintl units/mL (ref 0.35–5.50)
Total Bilirubin: 0.6 mg/dL (ref 0.3–1.2)
Total CHOL/HDL Ratio: 3.1
Total Protein, Urine: NEGATIVE mg/dL
Total Protein: 7.8 g/dL (ref 6.0–8.3)
Transferrin: 187.2 mg/dL — ABNORMAL LOW (ref >=212.0)
Triglycerides: 62 mg/dL (ref 0–149)
Urine Glucose: >=1000 mg/dL — CR
Urobilinogen, UA: 1 (ref 0.0–1.0)
VLDL: 12 mg/dL (ref 0–40)
Vitamin B-12: 205 pg/mL — ABNORMAL LOW (ref 211–911)
WBC: 4.5 10*3/uL (ref 4.5–10.5)
pH: 6.5 (ref 5.0–8.0)

## 2008-08-09 LAB — CBC WITH DIFFERENTIAL/PLATELET
Basophils Absolute: 0 10*3/uL (ref 0.0–0.1)
Eosinophils Absolute: 0.1 10*3/uL (ref 0.0–0.5)
HGB: 11.5 g/dL — ABNORMAL LOW (ref 13.0–17.1)
MONO#: 0.5 10*3/uL (ref 0.1–0.9)
NEUT#: 3.1 10*3/uL (ref 1.5–6.5)
Platelets: 219 10*3/uL (ref 145–400)
RBC: 3.05 10*6/uL — ABNORMAL LOW (ref 4.20–5.71)
RDW: 18 % — ABNORMAL HIGH (ref 11.2–14.6)
WBC: 4.8 10*3/uL (ref 4.0–10.0)

## 2008-08-23 ENCOUNTER — Encounter: Payer: Self-pay | Admitting: Internal Medicine

## 2008-08-23 ENCOUNTER — Ambulatory Visit: Payer: Self-pay | Admitting: Oncology

## 2008-08-23 LAB — CBC WITH DIFFERENTIAL/PLATELET
Basophils Absolute: 0 10*3/uL (ref 0.0–0.1)
Eosinophils Absolute: 0.1 10*3/uL (ref 0.0–0.5)
HCT: 34.1 % — ABNORMAL LOW (ref 38.7–49.9)
HGB: 11.4 g/dL — ABNORMAL LOW (ref 13.0–17.1)
MCV: 110.4 fL — ABNORMAL HIGH (ref 81.6–98.0)
NEUT#: 2.5 10*3/uL (ref 1.5–6.5)
RDW: 17.5 % — ABNORMAL HIGH (ref 11.2–14.6)
lymph#: 1 10*3/uL (ref 0.9–3.3)

## 2008-08-23 LAB — LACTATE DEHYDROGENASE: LDH: 149 U/L (ref 94–250)

## 2008-08-23 LAB — COMPREHENSIVE METABOLIC PANEL
Albumin: 4.6 g/dL (ref 3.5–5.2)
BUN: 28 mg/dL — ABNORMAL HIGH (ref 6–23)
Calcium: 8.9 mg/dL (ref 8.4–10.5)
Chloride: 106 mEq/L (ref 96–112)
Glucose, Bld: 121 mg/dL — ABNORMAL HIGH (ref 70–99)
Potassium: 4.1 mEq/L (ref 3.5–5.3)

## 2008-08-23 LAB — CEA: CEA: 1.5 ng/mL (ref 0.0–5.0)

## 2008-09-23 LAB — CBC WITH DIFFERENTIAL/PLATELET
BASO%: 0.1 % (ref 0.0–2.0)
EOS%: 1.9 % (ref 0.0–7.0)
Eosinophils Absolute: 0.1 10*3/uL (ref 0.0–0.5)
MCH: 37.1 pg — ABNORMAL HIGH (ref 28.0–33.4)
MCHC: 33.7 g/dL (ref 32.0–35.9)
MCV: 110.1 fL — ABNORMAL HIGH (ref 81.6–98.0)
MONO%: 16 % — ABNORMAL HIGH (ref 0.0–13.0)
NEUT#: 2 10*3/uL (ref 1.5–6.5)
RBC: 3.11 10*6/uL — ABNORMAL LOW (ref 4.20–5.71)
RDW: 18.1 % — ABNORMAL HIGH (ref 11.2–14.6)

## 2008-09-23 LAB — COMPREHENSIVE METABOLIC PANEL
ALT: 12 U/L (ref 0–53)
AST: 23 U/L (ref 0–37)
Albumin: 4.5 g/dL (ref 3.5–5.2)
Alkaline Phosphatase: 76 U/L (ref 39–117)
Potassium: 3.2 mEq/L — ABNORMAL LOW (ref 3.5–5.3)
Sodium: 140 mEq/L (ref 135–145)
Total Bilirubin: 0.6 mg/dL (ref 0.3–1.2)
Total Protein: 8.4 g/dL — ABNORMAL HIGH (ref 6.0–8.3)

## 2008-09-23 LAB — LACTATE DEHYDROGENASE: LDH: 155 U/L (ref 94–250)

## 2008-10-01 LAB — BASIC METABOLIC PANEL
Calcium: 9.6 mg/dL (ref 8.4–10.5)
Chloride: 101 mEq/L (ref 96–112)
Creatinine, Ser: 1.97 mg/dL — ABNORMAL HIGH (ref 0.40–1.50)
Sodium: 138 mEq/L (ref 135–145)

## 2008-10-13 ENCOUNTER — Ambulatory Visit (HOSPITAL_COMMUNITY): Admission: RE | Admit: 2008-10-13 | Discharge: 2008-10-13 | Payer: Self-pay | Admitting: Oncology

## 2008-10-14 ENCOUNTER — Ambulatory Visit: Payer: Self-pay | Admitting: Oncology

## 2008-10-18 ENCOUNTER — Encounter: Payer: Self-pay | Admitting: Internal Medicine

## 2008-10-18 LAB — COMPREHENSIVE METABOLIC PANEL
AST: 15 U/L (ref 0–37)
Albumin: 4.7 g/dL (ref 3.5–5.2)
BUN: 24 mg/dL — ABNORMAL HIGH (ref 6–23)
Calcium: 8.5 mg/dL (ref 8.4–10.5)
Chloride: 103 mEq/L (ref 96–112)
Glucose, Bld: 110 mg/dL — ABNORMAL HIGH (ref 70–99)
Potassium: 3.9 mEq/L (ref 3.5–5.3)
Total Protein: 8 g/dL (ref 6.0–8.3)

## 2008-10-18 LAB — CBC WITH DIFFERENTIAL/PLATELET
Basophils Absolute: 0 10*3/uL (ref 0.0–0.1)
EOS%: 3.2 % (ref 0.0–7.0)
Eosinophils Absolute: 0.1 10*3/uL (ref 0.0–0.5)
HGB: 11.2 g/dL — ABNORMAL LOW (ref 13.0–17.1)
NEUT#: 2.6 10*3/uL (ref 1.5–6.5)
RDW: 17.7 % — ABNORMAL HIGH (ref 11.0–14.6)
lymph#: 1 10*3/uL (ref 0.9–3.3)

## 2008-11-18 LAB — COMPREHENSIVE METABOLIC PANEL
CO2: 28 mEq/L (ref 19–32)
Creatinine, Ser: 1.79 mg/dL — ABNORMAL HIGH (ref 0.40–1.50)
Glucose, Bld: 130 mg/dL — ABNORMAL HIGH (ref 70–99)
Total Bilirubin: 0.5 mg/dL (ref 0.3–1.2)

## 2008-11-18 LAB — CBC WITH DIFFERENTIAL/PLATELET
Eosinophils Absolute: 0.1 10*3/uL (ref 0.0–0.5)
HCT: 32.8 % — ABNORMAL LOW (ref 38.4–49.9)
LYMPH%: 22.3 % (ref 14.0–49.0)
MCHC: 34 g/dL (ref 32.0–36.0)
MCV: 109 fL — ABNORMAL HIGH (ref 79.3–98.0)
MONO#: 0.5 10*3/uL (ref 0.1–0.9)
NEUT#: 2.7 10*3/uL (ref 1.5–6.5)
NEUT%: 62.3 % (ref 39.0–75.0)
Platelets: 185 10*3/uL (ref 140–400)
WBC: 4.4 10*3/uL (ref 4.0–10.3)

## 2008-11-18 LAB — LACTATE DEHYDROGENASE: LDH: 140 U/L (ref 94–250)

## 2008-12-16 ENCOUNTER — Ambulatory Visit: Payer: Self-pay | Admitting: Oncology

## 2008-12-20 ENCOUNTER — Encounter: Payer: Self-pay | Admitting: Internal Medicine

## 2008-12-23 ENCOUNTER — Ambulatory Visit (HOSPITAL_COMMUNITY): Admission: RE | Admit: 2008-12-23 | Discharge: 2008-12-23 | Payer: Self-pay | Admitting: Oncology

## 2009-01-17 IMAGING — CT CT PELVIS W/O CM
3 of 6 series · 13 of 32 positions shown, 18 images · IV contrast (agent unspecified)
Comparison: 10/30/03.

CLINICAL DATA: Hemoptysis.  Abdominal pain with nausea and vomiting and shortness of breath.  Multiple pulmonary nodules on chest x-ray.  History of colon cancer.  
 CHEST CT WITHOUT CONTRAST:
TECHNIQUE: Multidetector CT imaging of the chest was performed following the standard protocol without IV contrast.
TECHNIQUE: Multidetector CT imaging of the abdomen was performed following the standard protocol without IV contrast.
TECHNIQUE: Multidetector CT imaging of the pelvis was performed following the standard protocol without IV contrast.

[Series 400: sag · sagittal · 0.83mm/px · 5 of 151 slices shown, 10 images (1 of 2)]
[im 26/151  soft-tissue]
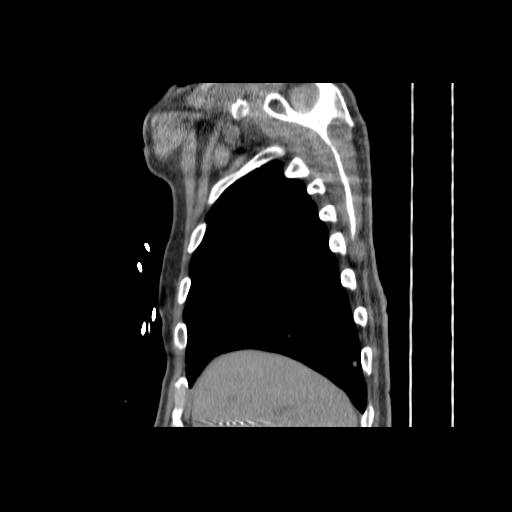
[im 26/151  lung]
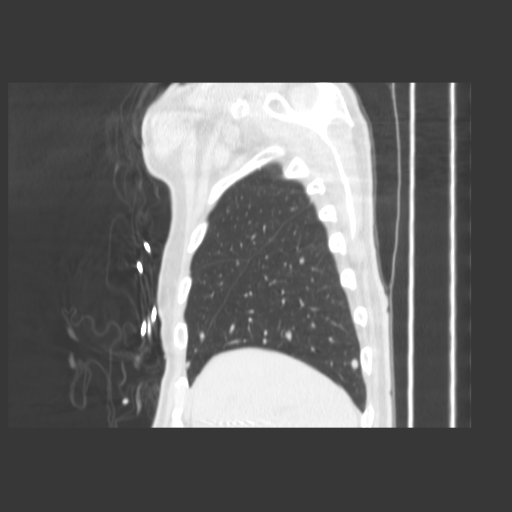
[im 26/151  bone]
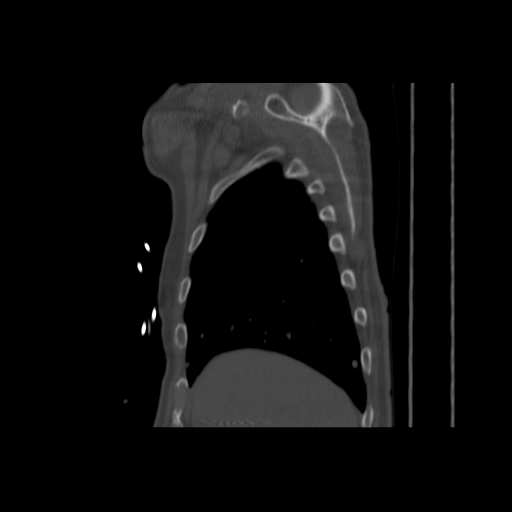
[im 51/151  soft-tissue]
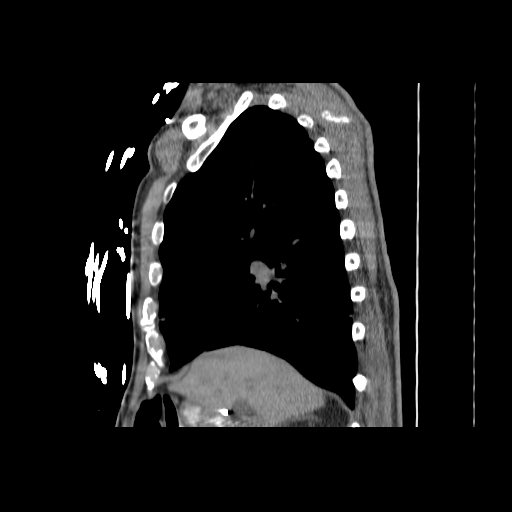
[im 51/151  lung]
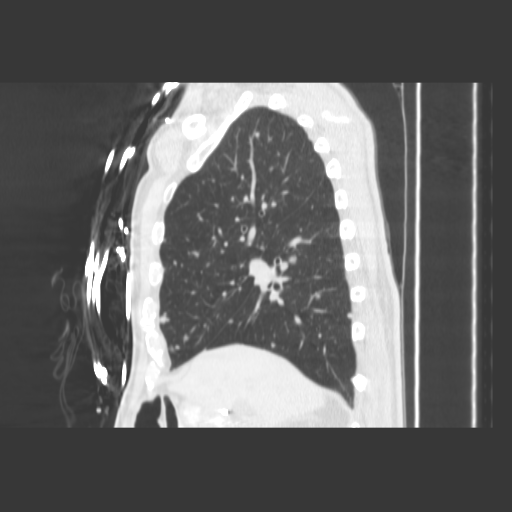
[im 76/151  soft-tissue]
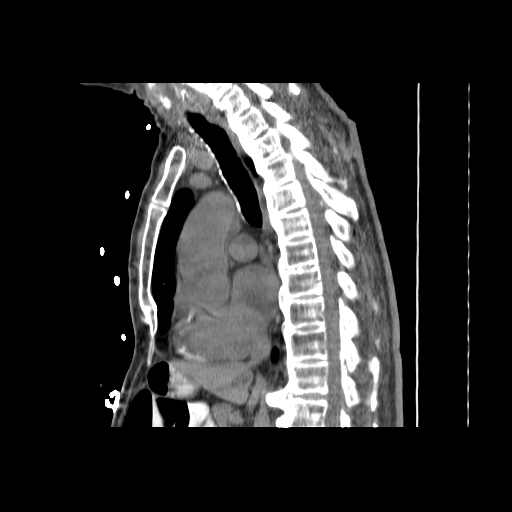
[im 76/151  lung]
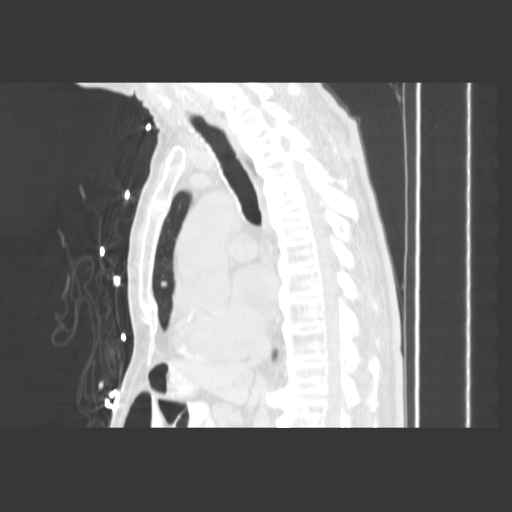
[im 101/151  soft-tissue]
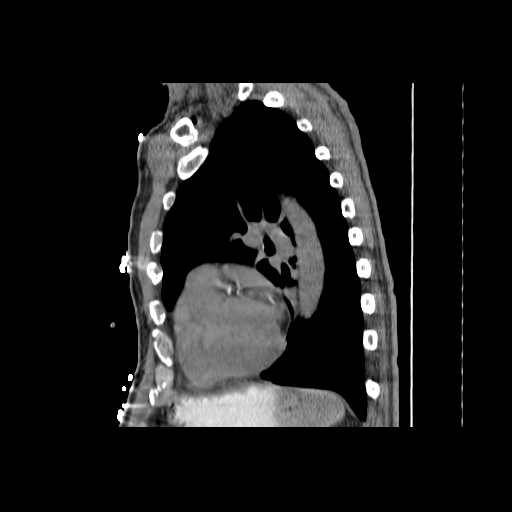
[im 101/151  lung]
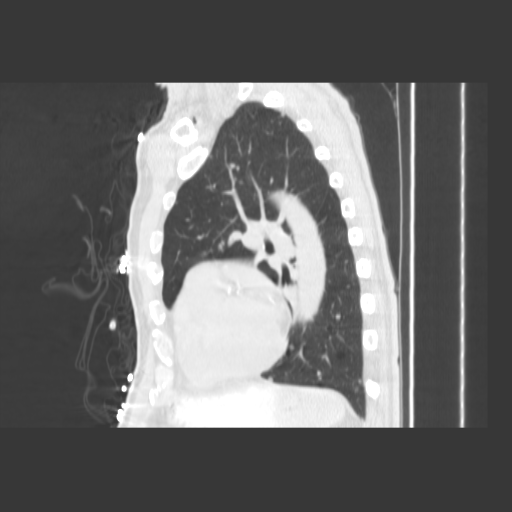
[im 126/151  soft-tissue]
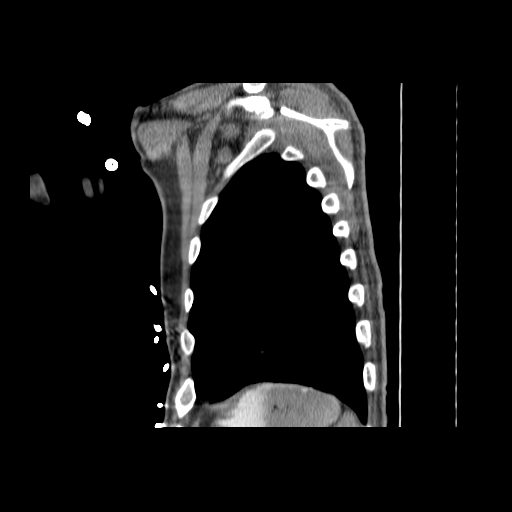

[Series 403: sag · sagittal · 0.85mm/px · 5 of 162 slices shown (2 of 2)]
[im 27/162  soft-tissue]
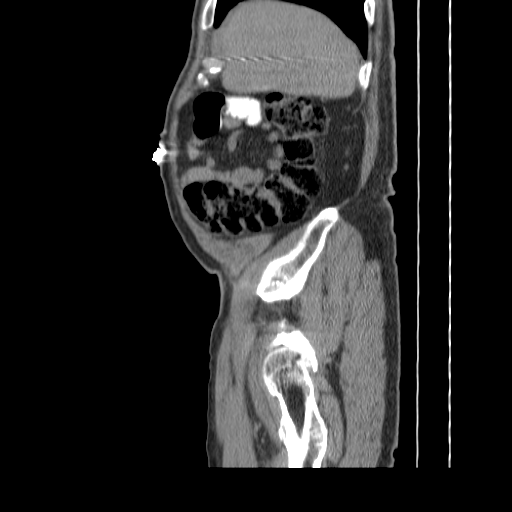
[im 54/162  soft-tissue]
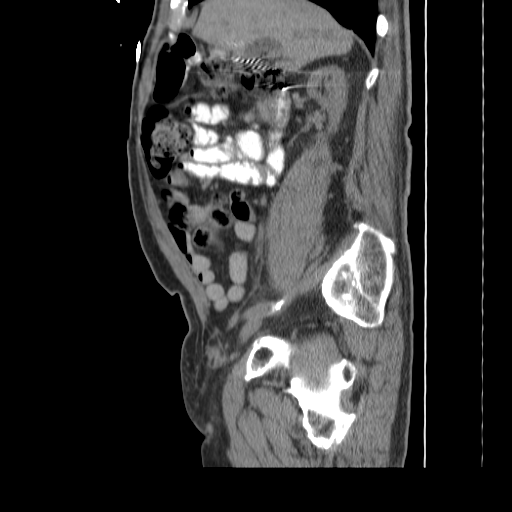
[im 81/162  soft-tissue]
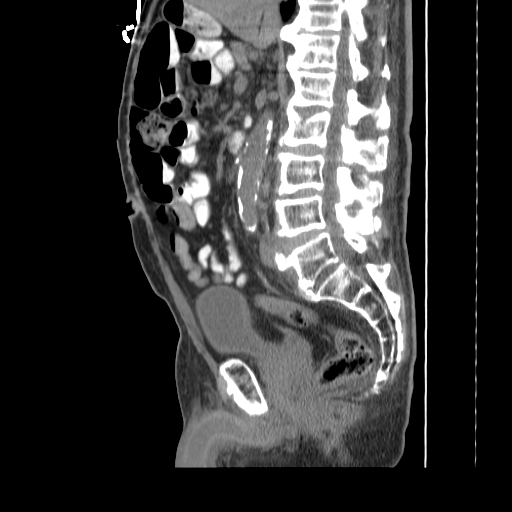
[im 108/162  soft-tissue]
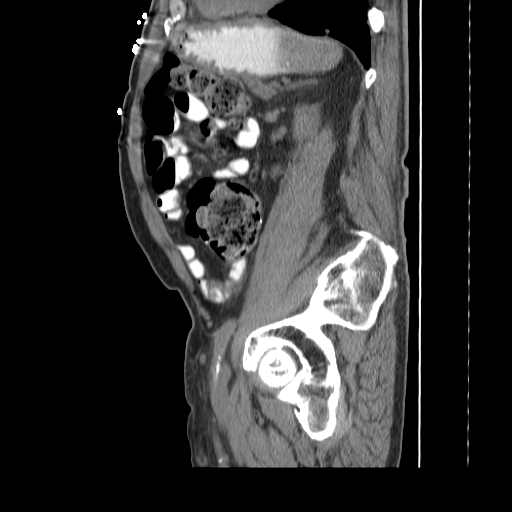
[im 135/162  soft-tissue]
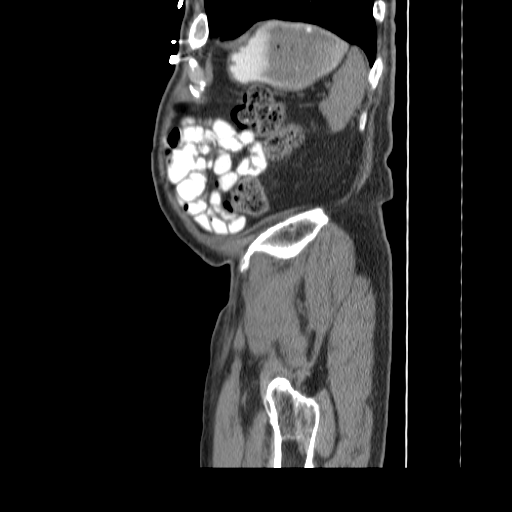

[Series 404: cor · coronal · 0.85mm/px · 3 of 125 slices shown]
[im 32/125  soft-tissue]
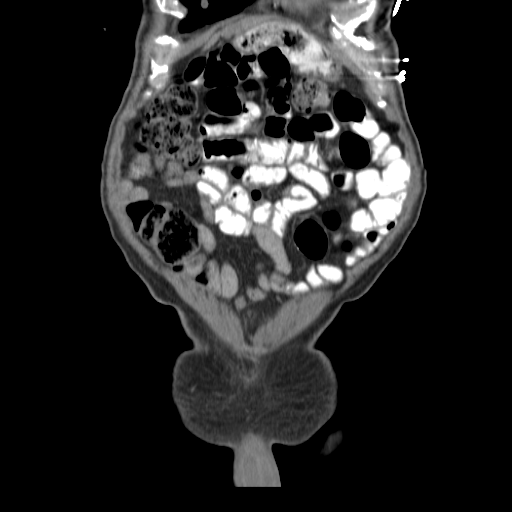
[im 63/125  soft-tissue]
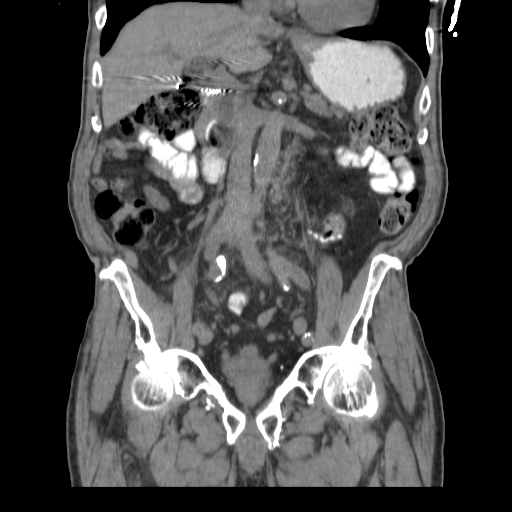
[im 94/125  soft-tissue]
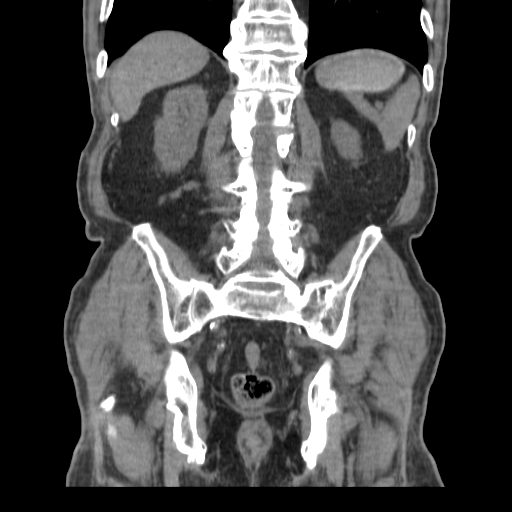

[13 of 32 positions shown; findings below may reference images not displayed]

FINDINGS: The scan demonstrates innumerable small pulmonary nodules throughout both lungs.  The largest nodules are at the bases with the largest being approximately 8.5 mm in size. These did not appear to be present on a prior chest x-ray of 01/09/07.  There is no discrete hilar or mediastinal adenopathy.  No effusions.  No bony abnormalities of significance.  
 The differentiation diagnosis of this appearance includes metastatic disease, atypical infection such as histoplasmosis.  However, given the findings in the abdomen, I favor metastatic disease.
IMPRESSION: Innumerable bilateral pulmonary nodules, most likely metastatic disease.  
 ABDOMEN CT WITHOUT CONTRAST:
FINDINGS: The liver, spleen, pancreas, adrenal glands, and kidneys demonstrate no significant abnormality.  There is dilatation of the common bile duct to a diameter of approximately 13 mm, stable since the prior study.  The gallbladder has been removed. 
 On images 93 through 99 there is an ill-defined soft tissue mass which obscures the borders between the abdominal aorta and the inferior vena cava, worrisome for a mass.  There are several small periaortic nodes to the left at the same level and there is evidence of colon anastomosis just to the left of the aorta at the same level.  This could represent metastatic disease with invasion of the inferior vena cava and secondary spread to the lungs or could represent a sarcoma of the inferior vena cava with spread to the lungs.  However, I feel this most likely represents metastatic colon cancer. 
 There is extensive stool in the colon and the colon is slightly dilated just proximal to the anastomosis.  The distal colon is not distended.  The small bowel is not distended.
IMPRESSION: 1.  New abnormal soft tissue density obscuring the margins of the aorta and inferior vena cava just above the aortic bifurcation with some small periaortic nodes in the same area.  This probably represents metastatic colon cancer with invasion of the inferior vena cava although sarcoma of the inferior vena cava is considered less likely.  This would explain the pulmonary nodules.  
 2.  No other significant abnormality.  
 PELVIS CT WITHOUT CONTRAST:
FINDINGS: The patient has no adenopathy or free fluid or bowel dilatation or other significant abnormality in the pelvis.  There are chronic degenerative changes in the lower lumbar spine.
IMPRESSION: No acute disease in the pelvis.

## 2009-02-24 ENCOUNTER — Ambulatory Visit: Payer: Self-pay | Admitting: Oncology

## 2009-02-28 ENCOUNTER — Encounter: Payer: Self-pay | Admitting: Internal Medicine

## 2009-02-28 ENCOUNTER — Ambulatory Visit (HOSPITAL_COMMUNITY): Admission: RE | Admit: 2009-02-28 | Discharge: 2009-02-28 | Payer: Self-pay | Admitting: Oncology

## 2009-02-28 LAB — CBC WITH DIFFERENTIAL/PLATELET
BASO%: 0.4 % (ref 0.0–2.0)
EOS%: 3.9 % (ref 0.0–7.0)
Eosinophils Absolute: 0.2 10*3/uL (ref 0.0–0.5)
LYMPH%: 22.6 % (ref 14.0–49.0)
MCH: 35.4 pg — ABNORMAL HIGH (ref 27.2–33.4)
MCHC: 33.6 g/dL (ref 32.0–36.0)
MCV: 105.6 fL — ABNORMAL HIGH (ref 79.3–98.0)
MONO%: 13.2 % (ref 0.0–14.0)
Platelets: 201 10*3/uL (ref 140–400)
RBC: 3.35 10*6/uL — ABNORMAL LOW (ref 4.20–5.82)
RDW: 14.1 % (ref 11.0–14.6)

## 2009-03-02 LAB — COMPREHENSIVE METABOLIC PANEL
AST: 17 U/L (ref 0–37)
Alkaline Phosphatase: 73 U/L (ref 39–117)
Glucose, Bld: 100 mg/dL — ABNORMAL HIGH (ref 70–99)
Sodium: 141 mEq/L (ref 135–145)
Total Bilirubin: 0.4 mg/dL (ref 0.3–1.2)
Total Protein: 7.6 g/dL (ref 6.0–8.3)

## 2009-04-21 ENCOUNTER — Ambulatory Visit: Payer: Self-pay | Admitting: Oncology

## 2009-04-21 ENCOUNTER — Ambulatory Visit (HOSPITAL_COMMUNITY): Admission: RE | Admit: 2009-04-21 | Discharge: 2009-04-21 | Payer: Self-pay | Admitting: Oncology

## 2009-04-26 ENCOUNTER — Encounter: Payer: Self-pay | Admitting: Internal Medicine

## 2009-06-23 ENCOUNTER — Ambulatory Visit: Payer: Self-pay | Admitting: Oncology

## 2009-06-27 ENCOUNTER — Encounter: Payer: Self-pay | Admitting: Internal Medicine

## 2009-06-27 LAB — CBC WITH DIFFERENTIAL/PLATELET
BASO%: 0.4 % (ref 0.0–2.0)
Eosinophils Absolute: 0.2 10*3/uL (ref 0.0–0.5)
LYMPH%: 25.7 % (ref 14.0–49.0)
MCHC: 32.9 g/dL (ref 32.0–36.0)
MONO#: 0.5 10*3/uL (ref 0.1–0.9)
NEUT#: 3.1 10*3/uL (ref 1.5–6.5)
Platelets: 231 10*3/uL (ref 140–400)
RBC: 3.8 10*6/uL — ABNORMAL LOW (ref 4.20–5.82)
RDW: 12.7 % (ref 11.0–14.6)
WBC: 5.1 10*3/uL (ref 4.0–10.3)
lymph#: 1.3 10*3/uL (ref 0.9–3.3)

## 2009-06-28 LAB — COMPREHENSIVE METABOLIC PANEL
ALT: 8 U/L (ref 0–53)
AST: 18 U/L (ref 0–37)
Albumin: 4.7 g/dL (ref 3.5–5.2)
Alkaline Phosphatase: 85 U/L (ref 39–117)
BUN: 30 mg/dL — ABNORMAL HIGH (ref 6–23)
CO2: 20 mEq/L (ref 19–32)
Calcium: 9 mg/dL (ref 8.4–10.5)
Chloride: 105 mEq/L (ref 96–112)
Creatinine, Ser: 1.96 mg/dL — ABNORMAL HIGH (ref 0.40–1.50)
Glucose, Bld: 136 mg/dL — ABNORMAL HIGH (ref 70–99)
Potassium: 3.9 mEq/L (ref 3.5–5.3)
Sodium: 144 mEq/L (ref 135–145)
Total Bilirubin: 0.4 mg/dL (ref 0.3–1.2)
Total Protein: 8 g/dL (ref 6.0–8.3)

## 2009-06-28 LAB — LACTATE DEHYDROGENASE: LDH: 161 U/L (ref 94–250)

## 2009-06-28 LAB — CEA: CEA: 1.4 ng/mL (ref 0.0–5.0)

## 2009-07-20 ENCOUNTER — Telehealth: Payer: Self-pay | Admitting: Internal Medicine

## 2009-07-22 ENCOUNTER — Telehealth: Payer: Self-pay | Admitting: Internal Medicine

## 2009-08-01 ENCOUNTER — Telehealth: Payer: Self-pay | Admitting: Internal Medicine

## 2009-08-22 ENCOUNTER — Ambulatory Visit: Payer: Self-pay | Admitting: Oncology

## 2009-08-25 ENCOUNTER — Ambulatory Visit (HOSPITAL_COMMUNITY): Admission: RE | Admit: 2009-08-25 | Discharge: 2009-08-25 | Payer: Self-pay | Admitting: Oncology

## 2009-08-25 LAB — CBC WITH DIFFERENTIAL/PLATELET
BASO%: 0.3 % (ref 0.0–2.0)
MCHC: 32.3 g/dL (ref 32.0–36.0)
MONO#: 0.7 10*3/uL (ref 0.1–0.9)
RBC: 3.87 10*6/uL — ABNORMAL LOW (ref 4.20–5.82)
RDW: 12.6 % (ref 11.0–14.6)
WBC: 3.6 10*3/uL — ABNORMAL LOW (ref 4.0–10.3)
lymph#: 1.4 10*3/uL (ref 0.9–3.3)
nRBC: 0 % (ref 0–0)

## 2009-08-25 LAB — COMPREHENSIVE METABOLIC PANEL
AST: 23 U/L (ref 0–37)
Albumin: 4.4 g/dL (ref 3.5–5.2)
Alkaline Phosphatase: 87 U/L (ref 39–117)
Potassium: 4.3 mEq/L (ref 3.5–5.3)
Sodium: 138 mEq/L (ref 135–145)
Total Protein: 8.3 g/dL (ref 6.0–8.3)

## 2009-08-25 LAB — LACTATE DEHYDROGENASE: LDH: 149 U/L (ref 94–250)

## 2009-08-29 ENCOUNTER — Inpatient Hospital Stay (HOSPITAL_COMMUNITY): Admission: AD | Admit: 2009-08-29 | Discharge: 2009-09-02 | Payer: Self-pay | Admitting: Oncology

## 2009-08-29 ENCOUNTER — Encounter: Payer: Self-pay | Admitting: Internal Medicine

## 2009-08-29 ENCOUNTER — Ambulatory Visit: Payer: Self-pay | Admitting: Oncology

## 2009-09-16 ENCOUNTER — Encounter: Payer: Self-pay | Admitting: Internal Medicine

## 2009-11-29 ENCOUNTER — Telehealth: Payer: Self-pay | Admitting: Internal Medicine

## 2010-01-10 ENCOUNTER — Ambulatory Visit: Payer: Self-pay | Admitting: Oncology

## 2010-01-17 ENCOUNTER — Emergency Department (HOSPITAL_COMMUNITY): Admission: EM | Admit: 2010-01-17 | Discharge: 2010-01-17 | Payer: Self-pay | Admitting: Family Medicine

## 2010-01-17 ENCOUNTER — Emergency Department (HOSPITAL_COMMUNITY): Admission: EM | Admit: 2010-01-17 | Discharge: 2010-01-18 | Payer: Self-pay | Admitting: Emergency Medicine

## 2010-01-18 ENCOUNTER — Ambulatory Visit: Payer: Self-pay | Admitting: Internal Medicine

## 2010-01-18 DIAGNOSIS — K5900 Constipation, unspecified: Secondary | ICD-10-CM | POA: Insufficient documentation

## 2010-01-18 DIAGNOSIS — R509 Fever, unspecified: Secondary | ICD-10-CM

## 2010-01-18 DIAGNOSIS — H9190 Unspecified hearing loss, unspecified ear: Secondary | ICD-10-CM | POA: Insufficient documentation

## 2010-01-18 DIAGNOSIS — R10816 Epigastric abdominal tenderness: Secondary | ICD-10-CM

## 2010-01-18 HISTORY — DX: Epigastric abdominal tenderness: R10.816

## 2010-01-18 HISTORY — DX: Fever, unspecified: R50.9

## 2010-01-18 HISTORY — DX: Constipation, unspecified: K59.00

## 2010-01-18 HISTORY — DX: Unspecified hearing loss, unspecified ear: H91.90

## 2010-01-18 LAB — CONVERTED CEMR LAB
Bilirubin Urine: NEGATIVE
Ketones, ur: NEGATIVE mg/dL
Leukocytes, UA: NEGATIVE
pH: 5 (ref 5.0–8.0)

## 2010-01-19 ENCOUNTER — Telehealth: Payer: Self-pay | Admitting: Internal Medicine

## 2010-01-27 ENCOUNTER — Telehealth: Payer: Self-pay | Admitting: Internal Medicine

## 2010-04-25 ENCOUNTER — Ambulatory Visit: Payer: Self-pay | Admitting: Internal Medicine

## 2010-04-25 ENCOUNTER — Encounter: Payer: Self-pay | Admitting: Internal Medicine

## 2010-04-25 ENCOUNTER — Telehealth: Payer: Self-pay | Admitting: Internal Medicine

## 2010-04-26 LAB — CONVERTED CEMR LAB
ALT: 10 units/L (ref 0–53)
AST: 22 units/L (ref 0–37)
Albumin: 4.4 g/dL (ref 3.5–5.2)
BUN: 22 mg/dL (ref 6–23)
Basophils Relative: 0.4 % (ref 0.0–3.0)
Bilirubin Urine: NEGATIVE
Chloride: 107 meq/L (ref 96–112)
Cholesterol: 183 mg/dL (ref 0–200)
Creatinine,U: 195.4 mg/dL
Eosinophils Relative: 3.9 % (ref 0.0–5.0)
HCT: 38.2 % — ABNORMAL LOW (ref 39.0–52.0)
Hemoglobin: 12.9 g/dL — ABNORMAL LOW (ref 13.0–17.0)
Hgb A1c MFr Bld: 6.4 % (ref 4.6–6.5)
Lymphs Abs: 1.4 10*3/uL (ref 0.7–4.0)
MCV: 94.9 fL (ref 78.0–100.0)
Microalb, Ur: 56.2 mg/dL — ABNORMAL HIGH (ref 0.0–1.9)
Monocytes Absolute: 0.6 10*3/uL (ref 0.1–1.0)
Neutro Abs: 3.5 10*3/uL (ref 1.4–7.7)
Nitrite: NEGATIVE
Platelets: 198 10*3/uL (ref 150.0–400.0)
Potassium: 5.1 meq/L (ref 3.5–5.1)
RBC: 4.02 M/uL — ABNORMAL LOW (ref 4.22–5.81)
Sodium: 146 meq/L — ABNORMAL HIGH (ref 135–145)
Total Protein: 7.9 g/dL (ref 6.0–8.3)
Triglycerides: 99 mg/dL (ref 0.0–149.0)
Urobilinogen, UA: 0.2 (ref 0.0–1.0)
WBC: 5.8 10*3/uL (ref 4.5–10.5)

## 2010-05-07 ENCOUNTER — Inpatient Hospital Stay (HOSPITAL_COMMUNITY): Admission: EM | Admit: 2010-05-07 | Discharge: 2010-05-09 | Payer: Self-pay | Admitting: Emergency Medicine

## 2010-05-30 ENCOUNTER — Ambulatory Visit: Payer: Self-pay | Admitting: Internal Medicine

## 2010-05-30 DIAGNOSIS — K5289 Other specified noninfective gastroenteritis and colitis: Secondary | ICD-10-CM

## 2010-05-30 HISTORY — DX: Other specified noninfective gastroenteritis and colitis: K52.89

## 2010-05-30 LAB — CONVERTED CEMR LAB
BUN: 22 mg/dL (ref 6–23)
Basophils Absolute: 0 10*3/uL (ref 0.0–0.1)
Basophils Relative: 0.4 % (ref 0.0–3.0)
Calcium: 9.6 mg/dL (ref 8.4–10.5)
Chloride: 98 meq/L (ref 96–112)
Creatinine, Ser: 2.1 mg/dL — ABNORMAL HIGH (ref 0.4–1.5)
Eosinophils Absolute: 0.1 10*3/uL (ref 0.0–0.7)
GFR calc non Af Amer: 39.61 mL/min (ref >60)
Hemoglobin: 12.2 g/dL — ABNORMAL LOW (ref 13.0–17.0)
Lymphocytes Relative: 33.1 % (ref 12.0–46.0)
MCHC: 33.5 g/dL (ref 30.0–36.0)
MCV: 94.7 fL (ref 78.0–100.0)
Monocytes Absolute: 0.6 10*3/uL (ref 0.1–1.0)
Neutro Abs: 2.2 10*3/uL (ref 1.4–7.7)
Neutrophils Relative %: 50 % (ref 43.0–77.0)
RBC: 3.85 M/uL — ABNORMAL LOW (ref 4.22–5.81)
RDW: 13.4 % (ref 11.5–14.6)

## 2010-06-27 ENCOUNTER — Telehealth: Payer: Self-pay | Admitting: Internal Medicine

## 2010-09-10 ENCOUNTER — Encounter: Payer: Self-pay | Admitting: Oncology

## 2010-09-19 NOTE — Progress Notes (Signed)
Summary: Refill--Benicar  Phone Note Call from Patient Call back at [phone]   Summary of Call: Pt's family memeber is calling about benicar rx. Needs a callb ack.  Initial call taken by: Lamar Sprinkles, CMA,  January 27, 2010 11:29 AM  Follow-up for Phone Call        I spoke with patient spouse and she states that prescription was originally sent to the wrong pharmacy and the patient is out of this med. Please send to [facility].  Follow-up by: Lucious Groves,  January 27, 2010 3:22 PM    Prescriptions: BENICAR 20 MG TABS (OLMESARTAN MEDOXOMIL) 1po once daily  #30 x 11   Entered by:   Lucious Groves   Authorized by:   Corwin Levins MD   Signed by:   Lucious Groves on 01/27/2010   Method used:   Faxed to ...       [facility] [address] [phone]* (retail)       [address]       [place], [place]  [address]       Ph: [phone]       Fax: [phone]   RxID:   [id number]

## 2010-09-19 NOTE — Progress Notes (Signed)
  Phone Note Outgoing Call   Summary of Call: Called pt and left msg to call and schedule OV. Patient has not been seen since 07/2008. Initial call taken by: Scharlene Gloss,  November 29, 2009 9:59 AM

## 2010-09-19 NOTE — Letter (Signed)
[facility]   Imported By: Sherian Rein 10/20/2009 07:57:07  _____________________________________________________________________  External Attachment:    Type:   Image     Comment:   External Document

## 2010-09-19 NOTE — Progress Notes (Signed)
Summary: Pharmacy change  Phone Note Call from Patient   Caller: Patient Initial call taken by: Margaret Pyle, CMA,  April 25, 2010 3:01 PM    Prescriptions: AMLODIPINE BESY-BENAZEPRIL HCL 5-20 MG CAPS (AMLODIPINE BESY-BENAZEPRIL HCL) 2po once daily  #180 x 3   Entered by:   Margaret Pyle, CMA   Authorized by:   Corwin Levins MD   Signed by:   Margaret Pyle, CMA on 04/25/2010   Method used:   Electronically to        [facility] [phone]* (retail)       [address]       [place], [place]  [address]       Ph: [phone]       Fax: [phone]   RxID:   [id number] OMEPRAZOLE 20 MG CPDR (OMEPRAZOLE) 1po once daily  #90 x 3   Entered by:   Margaret Pyle, CMA   Authorized by:   Corwin Levins MD   Signed by:   Margaret Pyle, CMA on 04/25/2010   Method used:   Electronically to        [facility] [phone]* (retail)       [address]       [place], [place]  [address]       Ph: [phone]       Fax: [phone]   RxID:   [id number]

## 2010-09-19 NOTE — Letter (Signed)
[facility]   Imported By: Lester Minden 09/27/2009 10:15:09  _____________________________________________________________________  External Attachment:    Type:   Image     Comment:   External Document

## 2010-09-19 NOTE — Assessment & Plan Note (Signed)
Summary: BP 238/112---ER LAST PM  STC   Vital Signs:  Patient profile:   77 year old male Height:      69 inches Weight:      151.75 pounds BMI:     22.49 O2 Sat:      98 % on Room air Temp:     99.4 degrees F oral Pulse rate:   89 / minute BP sitting:   160 / 98  (left arm) Cuff size:   regular  Vitals Entered ByZella Ball Ewing (January 18, 2010 3:38 PM)  O2 Flow:  Room air CC: followup/RE   CC:  followup/RE.  History of Present Illness: here to f/u, last seen here 2009,  after being seen in Er last PM and released earlier today;  CC was abd pain, improved with BM;  routine labs (cbc, bmet) and abd film neg for acute except ? ileus;  has hx of colon CA stage III with pos nodes tx 2000 with sugury, xrt and chemo followed by Dr Cyndie Chime, most recnetly hospdjan 2011 wtih early RLL pna.  Pt here with wife, states no abd pain at this time, and reiterates pain seemed improved with BM, has no n/v/d, chills, abd or back pain, no other change in bowel or bladder such as dysuria, freq, urg or hematuria.  Has no significant prostatsim or prior UTI.  Has been followed closely per oncology with reduction of BP meds, and pt no longer taking the pravastatin or PPI as well.  He reports some upper abd tenderness but no other pain.  Also incidently with a wk or two of hearing loss left ear without headahce, fever, ST, sinus symptoms or cough and Pt denies CP, sob, doe, wheezing, orthopnea, pnd, worsening LE edema, palps, dizziness or syncope  Pt denies new neuro symptoms such as headache, facial or extremity weakness Pt is unaware of low grade temp today.  No rash, joint pain, recent wt loss, night sweats.    Preventive Screening-Counseling & Management      Drug Use:  no.    Problems Prior to Update: 1)  Hearing Loss, Left Ear  (ICD-389.9) 2)  Epigastric Tenderness  (ICD-789.66) 3)  Fever Unspecified  (ICD-780.60) 4)  Constipation  (ICD-564.00) 5)  Preventive Health Care  (ICD-V70.0) 6)   Osteoarthritis, Knees, Bilateral  (ICD-715.96) 7)  Diabetes Mellitus, Type II  (ICD-250.00) 8)  Abdominal Pain, Right Upper Quadrant  (ICD-789.01) 9)  Anemia-nos  (ICD-285.9) 10)  Cholelithiasis  (ICD-574.20) 11)  Renal Insufficiency  (ICD-588.9) 12)  Nephrolithiasis, Hx of  (ICD-V13.01) 13)  Stricture, Esophageal  (ICD-530.3) 14)  Gerd  (ICD-530.81) 15)  Pulmonary Nodule  (ICD-518.89) 16)  Hyperlipidemia  (ICD-272.4) 17)  Hypertension  (ICD-401.9) 18)  Colon Cancer, Hx of  (ICD-V10.05)  Medications Prior to Update: 1)  Pravachol 20 Mg  Tabs (Pravastatin Sodium) .Marland Kitchen.. 1 By Mouth Once Daily 2)  Protonix 40 Mg  Tbec (Pantoprazole Sodium) .Marland Kitchen.. 1 By Mouth Once Daily Needs Office Visit For More Refills 3)  Amlodipine Besylate 10 Mg  Tabs (Amlodipine Besylate) .Marland Kitchen.. 1po Once Daily 4)  Benicar Hct 40-25 Mg  Tabs (Olmesartan Medoxomil-Hctz) .Marland Kitchen.. 1po Once Daily 5)  Cancer Pill .... 2 By Mouth Aqm and 1 By Mouth Qpm  Current Medications (verified): 1)  Omeprazole 20 Mg Cpdr (Omeprazole) .Marland Kitchen.. 1po Once Daily 2)  Benicar 20 Mg Tabs (Olmesartan Medoxomil) .Marland Kitchen.. 1po Once Daily 3)  Cancer Pill .... 2 By Mouth Aqm and 1 By Mouth Qpm 4)  Miralax  Powd (Polyethylene Glycol 3350) .... Use As Directed Otc Once Daily  Allergies (verified): No Known Drug Allergies  Past History:  Past Surgical History: Last updated: 03/24/2007 Small bowel resection  Social History: Last updated: 01/18/2010 Former Smoker Alcohol use-no Married 1 son retire - [employer] Drug use-no  Risk Factors: Smoking Status: quit (06/02/2007)  Past Medical History: Colon cancer, hx of/metatstatic - Dr Cyndie Chime Hyperlipidemia mult pulm nodules GERD esophagel stricture - egd 10/08 Nephrolithiasis, hx of Renal insufficiency Cholelithiasis Anemia-NOS Diabetes mellitus, type II  Social History: Reviewed history from 01/26/2008 and no changes required. Former Smoker Alcohol use-no Married 1 son retire - [employer] Drug use-no Drug Use:  no  Review of Systems       all otherwise negative per pt -    Physical Exam  General:  alert and well-developed.  , not ill appearing Head:  normocephalic and atraumatic.   Eyes:  vision grossly intact, pupils equal, and pupils round.   Ears:  R ear normal and L ear normal.   - after wax irrigated left ear Nose:  no external deformity and no nasal discharge.   Mouth:  no gingival abnormalities and pharynx pink and moist.   Neck:  supple and no masses.   Lungs:  normal respiratory effort and normal breath sounds.   Heart:  normal rate and regular rhythm.   Abdomen:  soft, normal bowel sounds, no distention, no guarding, and no rigidity.  but with very mild epigastric tender Msk:  no joint tenderness and no joint swelling.   Extremities:  no edema, no erythema    Impression & Recommendations:  Problem # 1:  CONSTIPATION (ICD-564.00)  ok for miralax daily, ER eval reviewed with pt - no further eval needed at this time  His updated medication list for this problem includes:    Miralax Powd (Polyethylene glycol 3350) ..... Use as directed otc once daily  Problem # 2:  FEVER UNSPECIFIED (ICD-780.60)  ?clinical significance - for ua and cxr  Orders: T-Culture, Urine ([address]) T-2 View CXR, Same Day (71020.5TC) TLB-Udip w/ Micro (81001-URINE)  Problem # 3:  EPIGASTRIC TENDERNESS (ICD-789.66) to re-start the prilosec 20 once daily   Problem # 4:  HYPERTENSION (ICD-401.9)  The following medications were removed from the medication list:    Amlodipine Besylate 10 Mg Tabs (Amlodipine besylate) .Marland Kitchen... 1po once daily His updated medication list for this problem includes:    Benicar 20 Mg Tabs (Olmesartan medoxomil) .Marland Kitchen... 1po once daily mild elev today, likely situational, ok to follow, continue same treatment   BP today: 160/98 Prior BP: 164/84 (07/28/2008)  Labs Reviewed: K+: 4.6 (07/28/2008) Creat: : 1.9 (07/28/2008)   Chol: 100  (07/28/2008)   HDL: 32.7 (07/28/2008)   LDL: 55 (07/28/2008)   TG: 62 (07/28/2008)  Problem # 5:  HEARING LOSS, LEFT EAR (ICD-389.9) improved s/p wax irrigation today  Complete Medication List: 1)  Omeprazole 20 Mg Cpdr (Omeprazole) .Marland Kitchen.. 1po once daily 2)  Benicar 20 Mg Tabs (Olmesartan medoxomil) .Marland Kitchen.. 1po once daily 3)  Cancer Pill  .... 2 by mouth aqm and 1 by mouth qpm 4)  Miralax Powd (Polyethylene glycol 3350) .... Use as directed otc once daily  Patient Instructions: 1)  Please take all new medications as prescribed - the miralax daily OTC, and the generic prilosec for the stomach 2)  Continue all previous medications as before this visit  - the Benicar 3)  You do not need to take the pravachol (  cholesterol) and the amlodipine (for blood pressure) at this time 4)  all of the medications you take were sent to the pharmacy 5)  Please schedule a follow-up appointment in 3 months with CPX labs Prescriptions: BENICAR 20 MG TABS (OLMESARTAN MEDOXOMIL) 1po once daily  #30 x 11   Entered and Authorized by:   Corwin Levins MD   Signed by:   Corwin Levins MD on 01/18/2010   Method used:   Electronically to        [facility]  [address] [phone]* (retail)       [address]       [place], [place]  [address]       Ph: [phone]       Fax: [phone]   RxID:   [id number] OMEPRAZOLE 20 MG CPDR (OMEPRAZOLE) 1po once daily  #30 x 11   Entered and Authorized by:   Corwin Levins MD   Signed by:   Corwin Levins MD on 01/18/2010   Method used:   Electronically to        [facility]  [address] [phone]* (retail)       [address]       [place], [place]  [address]       Ph: [phone]       Fax: [phone]   RxID:   [id number]

## 2010-09-19 NOTE — Progress Notes (Signed)
Summary: Pharmacy change  Phone Note Call from Patient Call back at Home Phone [phone]   Caller: Daughter Summary of Call: pt's daughter called stating that Rxs from last OV were sent to wrong pharmacy. Rxs re-sent per daughter request. Initial call taken by: Margaret Pyle, CMA,  January 19, 2010 3:44 PM    Prescriptions: BENICAR 20 MG TABS (OLMESARTAN MEDOXOMIL) 1po once daily  #30 x 11   Entered by:   Margaret Pyle, CMA   Authorized by:   Corwin Levins MD   Signed by:   Margaret Pyle, CMA on 01/19/2010   Method used:   Electronically to        [facility] [phone]* (retail)       [address]       [place], [place]  [address]       Ph: [phone]       Fax: [phone]   RxID:   [id number] OMEPRAZOLE 20 MG CPDR (OMEPRAZOLE) 1po once daily  #30 x 11   Entered by:   Margaret Pyle, CMA   Authorized by:   Corwin Levins MD   Signed by:   Margaret Pyle, CMA on 01/19/2010   Method used:   Electronically to        [facility] [phone]* (retail)       [address]       [place], [place]  [address]       Ph: [phone]       Fax: [phone]   RxID:   [id number]

## 2010-09-19 NOTE — Assessment & Plan Note (Signed)
Summary: CPX Domenica Fail Ellene Route /NWS  #   Vital Signs:  Patient profile:   77 year old male Height:      66 inches Weight:      156 pounds BMI:     25.27 O2 Sat:      98 % on Room air Temp:     99.2 degrees F oral Pulse rate:   100 / minute BP sitting:   180 / 94  (left arm) Cuff size:   regular  Vitals Entered By: Zella Ball Ewing CMA (AAMA) (April 25, 2010 1:14 PM)  O2 Flow:  Room air  CC: CPX/RE   CC:  CPX/RE.  History of Present Illness: here to f/u for CPX, is taking the benicar but too expensive;  Pt denies CP, worsening sob, doe, wheezing, orthopnea, pnd, worsening LE edema, palps, dizziness or syncope  Pt denies new neuro symptoms such as headache, facial or extremity weakness  No fever, wt loss, night sweats, loss of appetite or other constitutional symptoms Pt denies polydipsia, polyuria, or low sugar symptoms such as shakiness improved with eating.  Overall good compliance with meds, trying to follow low chol, DM diet, wt stable, little excercise however   Problems Prior to Update: 1)  Hearing Loss, Left Ear  (ICD-389.9) 2)  Epigastric Tenderness  (ICD-789.66) 3)  Fever Unspecified  (ICD-780.60) 4)  Constipation  (ICD-564.00) 5)  Preventive Health Care  (ICD-V70.0) 6)  Osteoarthritis, Knees, Bilateral  (ICD-715.96) 7)  Diabetes Mellitus, Type II  (ICD-250.00) 8)  Abdominal Pain, Right Upper Quadrant  (ICD-789.01) 9)  Anemia-nos  (ICD-285.9) 10)  Cholelithiasis  (ICD-574.20) 11)  Renal Insufficiency  (ICD-588.9) 12)  Nephrolithiasis, Hx of  (ICD-V13.01) 13)  Stricture, Esophageal  (ICD-530.3) 14)  Gerd  (ICD-530.81) 15)  Pulmonary Nodule  (ICD-518.89) 16)  Hyperlipidemia  (ICD-272.4) 17)  Hypertension  (ICD-401.9) 18)  Colon Cancer, Hx of  (ICD-V10.05)  Medications Prior to Update: 1)  Omeprazole 20 Mg Cpdr (Omeprazole) .Marland Kitchen.. 1po Once Daily 2)  Benicar 20 Mg Tabs (Olmesartan Medoxomil) .Marland Kitchen.. 1po Once Daily 3)  Cancer Pill .... 2 By Mouth Aqm and 1 By Mouth  Qpm 4)  Miralax  Powd (Polyethylene Glycol 3350) .... Use As Directed Otc Once Daily  Current Medications (verified): 1)  Omeprazole 20 Mg Cpdr (Omeprazole) .Marland Kitchen.. 1po Once Daily 2)  Amlodipine Besy-Benazepril Hcl 5-20 Mg Caps (Amlodipine Besy-Benazepril Hcl) .... 2po Once Daily 3)  Cancer Pill .... 2 By Mouth Aqm and 1 By Mouth Qpm 4)  Miralax  Powd (Polyethylene Glycol 3350) .... Use As Directed Otc Once Daily  Allergies (verified): No Known Drug Allergies  Past History:  Past Medical History: Last updated: 01/18/2010 Colon cancer, hx of/metatstatic - Dr Cyndie Chime Hyperlipidemia mult pulm nodules GERD esophagel stricture - egd 10/08 Nephrolithiasis, hx of Renal insufficiency Cholelithiasis Anemia-NOS Diabetes mellitus, type II  Past Surgical History: Last updated: 03/24/2007 Small bowel resection  Family History: Last updated: 06/02/2007 Family History Hypertension  Social History: Last updated: 04/25/2010 Former Smoker Alcohol use-no Married 1 son retire - [employer] Drug use-no illiterate - cannot read  Risk Factors: Smoking Status: quit (06/02/2007)  Social History: Former Smoker Alcohol use-no Married 1 son retire - [employer] Drug use-no illiterate - cannot read  Review of Systems  The patient denies anorexia, fever, weight loss, weight gain, vision loss, decreased hearing, hoarseness, chest pain, syncope, dyspnea on exertion, peripheral edema, prolonged cough, headaches, hemoptysis, abdominal pain, melena, hematochezia, severe indigestion/heartburn, hematuria, muscle weakness, suspicious skin lesions,  transient blindness, difficulty walking, depression, unusual weight change, abnormal bleeding, enlarged lymph nodes, and angioedema.         all otherwise negative per pt -    Physical Exam  General:  alert and well-developed.   Head:  normocephalic and atraumatic.   Eyes:  vision grossly intact, pupils equal, and pupils round.   Ears:  R ear  normal and L ear normal.   Nose:  no external deformity and no nasal discharge.   Mouth:  no gingival abnormalities and pharynx pink and moist.   Neck:  supple and no masses.   Lungs:  normal respiratory effort and normal breath sounds.   Heart:  normal rate and regular rhythm.   Abdomen:  soft, non-tender, and normal bowel sounds.   Msk:  no joint tenderness and no joint swelling.   Extremities:  no edema, no erythema  Neurologic:  cranial nerves II-XII intact and strength normal in all extremities.     Impression & Recommendations:  Problem # 1:  Preventive Health Care (ICD-V70.0)  Overall doing well, age appropriate education and counseling updated and referral for appropriate preventive services done unless declined, immunizations up to date or declined, diet counseling done if overweight, urged to quit smoking if smokes , most recent labs reviewed and current ordered if appropriate, ecg reviewed or declined (interpretation per ECG scanned in the EMR if done); information regarding Medicare Prevention requirements given if appropriate; speciality referrals updated as appropriate   Orders: TLB-BMP (Basic Metabolic Panel-BMET) (80048-METABOL) TLB-CBC Platelet - w/Differential (85025-CBCD) TLB-Hepatic/Liver Function Pnl (80076-HEPATIC) TLB-Lipid Panel (80061-LIPID) TLB-TSH (Thyroid Stimulating Hormone) (84443-TSH) TLB-Udip ONLY (81003-UDIP)  Problem # 2:  HYPERTENSION (ICD-401.9)  His updated medication list for this problem includes:    Amlodipine Besy-benazepril Hcl 5-20 Mg Caps (Amlodipine besy-benazepril hcl) .Marland Kitchen... 2po once daily treat as above, f/u any worsening signs or symptoms  - needs better control  BP today: 180/94 Prior BP: 160/98 (01/18/2010)  Labs Reviewed: K+: 4.6 (07/28/2008) Creat: : 1.9 (07/28/2008)   Chol: 100 (07/28/2008)   HDL: 32.7 (07/28/2008)   LDL: 55 (07/28/2008)   TG: 62 (07/28/2008)  Problem # 3:  DIABETES MELLITUS, TYPE II (ICD-250.00)  His  updated medication list for this problem includes:    Amlodipine Besy-benazepril Hcl 5-20 Mg Caps (Amlodipine besy-benazepril hcl) .Marland Kitchen... 2po once daily asympt - for a1c check today, Pt to cont DM diet, excercise, wt loss efforts; to check labs today   Orders: TLB-A1C / Hgb A1C (Glycohemoglobin) (83036-A1C) TLB-Microalbumin/Creat Ratio, Urine (82043-MALB)  Problem # 4:  RENAL INSUFFICIENCY (ICD-588.9) need to watch closely with the ACE - to f/u one month  Complete Medication List: 1)  Omeprazole 20 Mg Cpdr (Omeprazole) .Marland Kitchen.. 1po once daily 2)  Amlodipine Besy-benazepril Hcl 5-20 Mg Caps (Amlodipine besy-benazepril hcl) .... 2po once daily 3)  Cancer Pill  .... 2 by mouth aqm and 1 by mouth qpm 4)  Miralax Powd (Polyethylene glycol 3350) .... Use as directed otc once daily  Other Orders: Flu Vaccine 91yrs + MEDICARE PATIENTS (A2130) Administration Flu vaccine - MCR (Q6578)  Patient Instructions: 1)  stop the benicar (blood pressure pill) 2)  start the amlodipine -  benazepril  - TWO pills per day  (this was sent to [facility] on the computer;  remember you can have the [facility] you go to transfer the prescription to that store 3)  Continue all previous medications as before this visit 4)  you had the flu shot today 5)  Please schedule a  follow-up appointment in 1 month. Prescriptions: OMEPRAZOLE 20 MG CPDR (OMEPRAZOLE) 1po once daily  #90 x 3   Entered and Authorized by:   Corwin Levins MD   Signed by:   Corwin Levins MD on 04/25/2010   Method used:   Electronically to        [facility] (retail)       [address]       [place], [place]  [address]       Ph: [phone]       Fax: [phone]   RxID:   [id number] AMLODIPINE BESY-BENAZEPRIL HCL 5-20 MG CAPS (AMLODIPINE BESY-BENAZEPRIL HCL) 2po once daily  #180 x 3   Entered and Authorized by:   Corwin Levins MD   Signed by:   Corwin Levins MD on 04/25/2010   Method used:   Electronically to         [facility] (retail)       [address]       [place], [place]  [address]       Ph: [phone]       Fax: [phone]   RxID:   [id number]    Flu Vaccine Consent Questions     Do you have a history of severe allergic reactions to this vaccine? no    Any prior history of allergic reactions to egg and/or gelatin? no    Do you have a sensitivity to the preservative Thimersol? no    Do you have a past history of Guillan-Barre Syndrome? no    Do you currently have an acute febrile illness? no    Have you ever had a severe reaction to latex? no    Vaccine information given and explained to patient? yes    Are you currently pregnant? no    Lot Number:[id number]   Exp Date:02/17/2011   Site Given  Left Deltoid IMflu

## 2010-09-19 NOTE — Assessment & Plan Note (Signed)
Summary: 1 MO ROV /NWS  #   Vital Signs:  Patient profile:   77 year old male Height:      65 inches Weight:      152 pounds BMI:     25.39 O2 Sat:      96 % on Room air Temp:     98 degrees F oral Pulse rate:   90 / minute BP sitting:   114 / 60  (left arm) Cuff size:   regular  Vitals Entered By: Zella Ball Ewing CMA (AAMA) (May 30, 2010 2:04 PM)  O2 Flow:  Room air CC: 1 month ROV/RE   CC:  1 month ROV/RE.  History of Present Illness: here for f/u post hospn for AGE with dehydration and AKD on CKD ;  did well with rehydration and the lotrel changed to amlodipine; however at d/c pt was confused about med and took all of his prior meds (including the lotrel) as well as the amlodipine 10 mg;  Pt denies CP, worsening sob, doe, wheezing, orthopnea, pnd, worsening LE edema, palps, syncope, but has signficant dizziness.  Pt denies new neuro symptoms such as headache, facial or extremity weakness  Pt denies polydipsia, polyuria  Overall good compliance with meds, trying to follow low chol,  diet, wt stable, little excercise however .  No overt bleeding or bruising.  No fever, wt loss, night sweats, loss of appetite or other constitutional symptoms  No further n/v or diarrhea, abd pain, chills. Takes by mouth well   Problems Prior to Update: 1)  Gastroenteritis, Acute  (ICD-558.9) 2)  Hearing Loss, Left Ear  (ICD-389.9) 3)  Epigastric Tenderness  (ICD-789.66) 4)  Fever Unspecified  (ICD-780.60) 5)  Constipation  (ICD-564.00) 6)  Preventive Health Care  (ICD-V70.0) 7)  Osteoarthritis, Knees, Bilateral  (ICD-715.96) 8)  Diabetes Mellitus, Type II  (ICD-250.00) 9)  Abdominal Pain, Right Upper Quadrant  (ICD-789.01) 10)  Anemia-nos  (ICD-285.9) 11)  Cholelithiasis  (ICD-574.20) 12)  Renal Insufficiency  (ICD-588.9) 13)  Nephrolithiasis, Hx of  (ICD-V13.01) 14)  Stricture, Esophageal  (ICD-530.3) 15)  Gerd  (ICD-530.81) 16)  Pulmonary Nodule  (ICD-518.89) 17)  Hyperlipidemia   (ICD-272.4) 18)  Hypertension  (ICD-401.9) 19)  Colon Cancer, Hx of  (ICD-V10.05)  Medications Prior to Update: 1)  Omeprazole 20 Mg Cpdr (Omeprazole) .Marland Kitchen.. 1po Once Daily 2)  Amlodipine Besy-Benazepril Hcl 5-20 Mg Caps (Amlodipine Besy-Benazepril Hcl) .... 2po Once Daily 3)  Cancer Pill .... 2 By Mouth Aqm and 1 By Mouth Qpm 4)  Miralax  Powd (Polyethylene Glycol 3350) .... Use As Directed Otc Once Daily  Current Medications (verified): 1)  Omeprazole 20 Mg Cpdr (Omeprazole) .Marland Kitchen.. 1po Once Daily 2)  Amlodipine Besy-Benazepril Hcl 5-20 Mg Caps (Amlodipine Besy-Benazepril Hcl) .... 2po Once Daily 3)  Miralax  Powd (Polyethylene Glycol 3350) .... Use As Directed Otc Once Daily 4)  Clonidine Hcl 0.1 Mg Tabs (Clonidine Hcl) .Marland Kitchen.. 1 By Mouth Three Times A Day  Allergies (verified): No Known Drug Allergies  Past History:  Past Medical History: Last updated: 01/18/2010 Colon cancer, hx of/metatstatic - Dr Cyndie Chime Hyperlipidemia mult pulm nodules GERD esophagel stricture - egd 10/08 Nephrolithiasis, hx of Renal insufficiency Cholelithiasis Anemia-NOS Diabetes mellitus, type II  Past Surgical History: Last updated: 03/24/2007 Small bowel resection  Social History: Last updated: 04/25/2010 Former Smoker Alcohol use-no Married 1 son retire - [employer] Drug use-no illiterate - cannot read  Risk Factors: Smoking Status: quit (06/02/2007)  Review of Systems  all otherwise negative per pt -    Physical Exam  General:  alert and well-developed.   Head:  normocephalic and atraumatic.   Eyes:  vision grossly intact, pupils equal, and pupils round.   Ears:  R ear normal and L ear normal.   Nose:  no external deformity and no nasal discharge.   Mouth:  no gingival abnormalities and pharynx pink and moist.   Neck:  supple and no masses.   Lungs:  normal respiratory effort and normal breath sounds.   Heart:  normal rate and regular rhythm.   Abdomen:  soft,  non-tender, and normal bowel sounds.   Msk:  no joint tenderness and no joint swelling.   Extremities:  no edema, no erythema  Neurologic:  strength normal in all extremities and gait normal.   Skin:  color normal and no rashes.   Psych:  not anxious appearing and not depressed appearing.     Impression & Recommendations:  Problem # 1:  GASTROENTERITIS, ACUTE (ICD-558.9) with recent dehydration, volume loss, and AKD - resolved  Problem # 2:  ANEMIA-NOS (ICD-285.9)  for f/u lab, asympt  Orders: TLB-CBC Platelet - w/Differential (85025-CBCD)  Hgb: 12.9 (04/25/2010)   Hct: 38.2 (04/25/2010)   Platelets: 198.0 (04/25/2010) RBC: 4.02 (04/25/2010)   RDW: 13.9 (04/25/2010)   WBC: 5.8 (04/25/2010) MCV: 94.9 (04/25/2010)   MCHC: 33.7 (04/25/2010) Iron: 92 (07/28/2008)   % Sat: 35.1 (07/28/2008) B12: 205 (07/28/2008)   Folate: > 20.0 ng/mL (07/28/2008)   TSH: 2.73 (04/25/2010)  Problem # 3:  HYPERTENSION (ICD-401.9)  His updated medication list for this problem includes:    Amlodipine Besy-benazepril Hcl 5-20 Mg Caps (Amlodipine besy-benazepril hcl) .Marland Kitchen... 2po once daily    Clonidine Hcl 0.1 Mg Tabs (Clonidine hcl) .Marland Kitchen... 1 by mouth three times a day  BP today: 114/60 Prior BP: 180/94 (04/25/2010)  Labs Reviewed: K+: 5.1 (04/25/2010) Creat: : 1.6 (04/25/2010)   Chol: 183 (04/25/2010)   HDL: 33.90 (04/25/2010)   LDL: 129 (04/25/2010)   TG: 99.0 (04/25/2010) improved, Continue all previous medications as before this visit except to stop the redundant amlodipine 10 mg, hopefull to help with the occasional dizziness  Problem # 4:  DIABETES MELLITUS, TYPE II (ICD-250.00)  His updated medication list for this problem includes:    Amlodipine Besy-benazepril Hcl 5-20 Mg Caps (Amlodipine besy-benazepril hcl) .Marland Kitchen... 2po once daily  Labs Reviewed: Creat: 1.6 (04/25/2010)    Reviewed HgBA1c results: 6.4 (04/25/2010)  6.0 (07/28/2008) stable overall by hx and exam, ok to continue meds/tx as is  - no meds neded at this time  Complete Medication List: 1)  Omeprazole 20 Mg Cpdr (Omeprazole) .Marland Kitchen.. 1po once daily 2)  Amlodipine Besy-benazepril Hcl 5-20 Mg Caps (Amlodipine besy-benazepril hcl) .... 2po once daily 3)  Miralax Powd (Polyethylene glycol 3350) .... Use as directed otc once daily 4)  Clonidine Hcl 0.1 Mg Tabs (Clonidine hcl) .Marland Kitchen.. 1 by mouth three times a day  Other Orders: TLB-BMP (Basic Metabolic Panel-BMET) ([id number])  Patient Instructions: 1)  Please go to the Lab in the basement for your blood and/or urine tests today 2)  Please call the number on the [facility] Card for results of your testing 3)  Only take the medications on the list below 4)  Please schedule a follow-up appointment in 6 months, or sooner if needed

## 2010-09-19 NOTE — Progress Notes (Signed)
  Phone Note Refill Request Message from:  Fax from Pharmacy on June 27, 2010 11:25 AM  Refills Requested: Medication #1:  CLONIDINE HCL 0.1 MG TABS 1 by mouth three times a day.   Dosage confirmed as above?Dosage Confirmed   Notes: [employer], [phone] Initial call taken by: Zella Ball Ewing CMA Duncan Dull),  June 27, 2010 11:25 AM    Prescriptions: CLONIDINE HCL 0.1 MG TABS (CLONIDINE HCL) 1 by mouth three times a day  #90 x 10   Entered by:   Zella Ball Ewing CMA (AAMA)   Authorized by:   Corwin Levins MD   Signed by:   Scharlene Gloss CMA (AAMA) on 06/27/2010   Method used:   Faxed to ...       [facility] [address] [phone]* (retail)       [address]       [place], [place]  [address]       Ph: [phone]       Fax: [phone]   RxID:   [id number]

## 2010-11-02 LAB — BASIC METABOLIC PANEL
BUN: 38 mg/dL — ABNORMAL HIGH (ref 6–23)
CO2: 21 mEq/L (ref 19–32)
Chloride: 106 mEq/L (ref 96–112)
Chloride: 111 mEq/L (ref 96–112)
GFR calc non Af Amer: 23 mL/min — ABNORMAL LOW (ref >60)
Glucose, Bld: 97 mg/dL (ref 70–99)
Potassium: 3.2 mEq/L — ABNORMAL LOW (ref 3.5–5.1)
Potassium: 3.4 mEq/L — ABNORMAL LOW (ref 3.5–5.1)
Sodium: 139 mEq/L (ref 135–145)

## 2010-11-02 LAB — URINE CULTURE: Colony Count: 25000

## 2010-11-02 LAB — DIFFERENTIAL
Basophils Absolute: 0 10*3/uL (ref 0.0–0.1)
Basophils Relative: 0 % (ref 0–1)
Eosinophils Absolute: 0 10*3/uL (ref 0.0–0.7)
Monocytes Absolute: 0.2 10*3/uL (ref 0.1–1.0)
Monocytes Relative: 4 % (ref 3–12)
Neutro Abs: 5.9 10*3/uL (ref 1.7–7.7)
Neutrophils Relative %: 90 % — ABNORMAL HIGH (ref 43–77)

## 2010-11-02 LAB — CBC
HCT: 32.8 % — ABNORMAL LOW (ref 39.0–52.0)
HCT: 34.1 % — ABNORMAL LOW (ref 39.0–52.0)
Hemoglobin: 11.2 g/dL — ABNORMAL LOW (ref 13.0–17.0)
Hemoglobin: 11.7 g/dL — ABNORMAL LOW (ref 13.0–17.0)
MCH: 30.7 pg (ref 26.0–34.0)
MCH: 31 pg (ref 26.0–34.0)
MCHC: 34.1 g/dL (ref 30.0–36.0)
MCHC: 34.3 g/dL (ref 30.0–36.0)
MCV: 89.9 fL (ref 78.0–100.0)

## 2010-11-02 LAB — URINALYSIS, ROUTINE W REFLEX MICROSCOPIC
Bilirubin Urine: NEGATIVE
Ketones, ur: NEGATIVE mg/dL
Protein, ur: 30 mg/dL — AB
Urobilinogen, UA: 0.2 mg/dL (ref 0.0–1.0)

## 2010-11-02 LAB — COMPREHENSIVE METABOLIC PANEL
BUN: 83 mg/dL — ABNORMAL HIGH (ref 6–23)
CO2: 21 mEq/L (ref 19–32)
Calcium: 8.4 mg/dL (ref 8.4–10.5)
Creatinine, Ser: 4.76 mg/dL — ABNORMAL HIGH (ref 0.4–1.5)
GFR calc non Af Amer: 12 mL/min — ABNORMAL LOW (ref >60)
Glucose, Bld: 157 mg/dL — ABNORMAL HIGH (ref 70–99)

## 2010-11-02 LAB — POCT CARDIAC MARKERS
CKMB, poc: 1.2 ng/mL (ref 1.0–8.0)
Myoglobin, poc: 240 ng/mL (ref 12–200)
Myoglobin, poc: 365 ng/mL (ref 12–200)

## 2010-11-02 LAB — CLOSTRIDIUM DIFFICILE BY PCR: Toxigenic C. Difficile by PCR: NOT DETECTED

## 2010-11-02 LAB — LIPID PANEL
Cholesterol: 142 mg/dL (ref 0–200)
LDL Cholesterol: 73 mg/dL (ref 0–99)

## 2010-11-02 LAB — POTASSIUM: Potassium: 2.4 mEq/L — CL (ref 3.5–5.1)

## 2010-11-05 LAB — DIFFERENTIAL
Basophils Absolute: 0 10*3/uL (ref 0.0–0.1)
Lymphocytes Relative: 3 % — ABNORMAL LOW (ref 12–46)
Neutro Abs: 12 10*3/uL — ABNORMAL HIGH (ref 1.7–7.7)

## 2010-11-05 LAB — COMPREHENSIVE METABOLIC PANEL
BUN: 43 mg/dL — ABNORMAL HIGH (ref 6–23)
CO2: 25 mEq/L (ref 19–32)
Chloride: 101 mEq/L (ref 96–112)
Creatinine, Ser: 2.8 mg/dL — ABNORMAL HIGH (ref 0.4–1.5)
GFR calc non Af Amer: 22 mL/min — ABNORMAL LOW (ref >60)
Total Bilirubin: 1.2 mg/dL (ref 0.3–1.2)

## 2010-11-05 LAB — CBC
HCT: 29.2 % — ABNORMAL LOW (ref 39.0–52.0)
HCT: 33 % — ABNORMAL LOW (ref 39.0–52.0)
MCV: 95.9 fL (ref 78.0–100.0)
MCV: 96.5 fL (ref 78.0–100.0)
Platelets: 140 10*3/uL — ABNORMAL LOW (ref 150–400)
Platelets: 160 10*3/uL (ref 150–400)
RBC: 3.44 MIL/uL — ABNORMAL LOW (ref 4.22–5.81)
RDW: 12.4 % (ref 11.5–15.5)
WBC: 10.2 10*3/uL (ref 4.0–10.5)
WBC: 13.4 10*3/uL — ABNORMAL HIGH (ref 4.0–10.5)

## 2010-11-05 LAB — CULTURE, BLOOD (ROUTINE X 2): Culture: NO GROWTH

## 2010-11-05 LAB — BASIC METABOLIC PANEL
BUN: 41 mg/dL — ABNORMAL HIGH (ref 6–23)
CO2: 27 mEq/L (ref 19–32)
Chloride: 102 mEq/L (ref 96–112)
Chloride: 102 mEq/L (ref 96–112)
Creatinine, Ser: 2.05 mg/dL — ABNORMAL HIGH (ref 0.4–1.5)
GFR calc Af Amer: 38 mL/min — ABNORMAL LOW (ref >60)
Glucose, Bld: 118 mg/dL — ABNORMAL HIGH (ref 70–99)
Potassium: 3.7 mEq/L (ref 3.5–5.1)
Potassium: 3.8 mEq/L (ref 3.5–5.1)

## 2010-11-06 LAB — DIFFERENTIAL
Basophils Relative: 0 % (ref 0–1)
Eosinophils Absolute: 0 10*3/uL (ref 0.0–0.7)
Eosinophils Relative: 1 % (ref 0–5)
Monocytes Absolute: 0.6 10*3/uL (ref 0.1–1.0)
Monocytes Relative: 8 % (ref 3–12)
Neutrophils Relative %: 81 % — ABNORMAL HIGH (ref 43–77)

## 2010-11-06 LAB — CBC
HCT: 39.9 % (ref 39.0–52.0)
MCHC: 33.2 g/dL (ref 30.0–36.0)
MCV: 94.1 fL (ref 78.0–100.0)
RBC: 4.24 MIL/uL (ref 4.22–5.81)

## 2010-11-29 ENCOUNTER — Encounter: Payer: Self-pay | Admitting: Internal Medicine

## 2010-11-29 ENCOUNTER — Ambulatory Visit (INDEPENDENT_AMBULATORY_CARE_PROVIDER_SITE_OTHER): Payer: Medicare Other | Admitting: Internal Medicine

## 2010-11-29 ENCOUNTER — Other Ambulatory Visit (INDEPENDENT_AMBULATORY_CARE_PROVIDER_SITE_OTHER): Payer: Medicare Other

## 2010-11-29 VITALS — BP 180/70 | HR 110 | Temp 98.1°F | Ht 69.0 in | Wt 147.5 lb

## 2010-11-29 DIAGNOSIS — Z0000 Encounter for general adult medical examination without abnormal findings: Secondary | ICD-10-CM

## 2010-11-29 DIAGNOSIS — Z0001 Encounter for general adult medical examination with abnormal findings: Secondary | ICD-10-CM | POA: Insufficient documentation

## 2010-11-29 DIAGNOSIS — I10 Essential (primary) hypertension: Secondary | ICD-10-CM

## 2010-11-29 LAB — URINALYSIS, ROUTINE W REFLEX MICROSCOPIC
Bilirubin Urine: NEGATIVE
Leukocytes, UA: NEGATIVE
Nitrite: NEGATIVE
pH: 6 (ref 5.0–8.0)

## 2010-11-29 LAB — CBC WITH DIFFERENTIAL/PLATELET
Eosinophils Relative: 2.6 % (ref 0.0–5.0)
Monocytes Absolute: 0.6 10*3/uL (ref 0.1–1.0)
Monocytes Relative: 10.9 % (ref 3.0–12.0)
Neutrophils Relative %: 59.2 % (ref 43.0–77.0)
Platelets: 236 10*3/uL (ref 150.0–400.0)
WBC: 5.9 10*3/uL (ref 4.5–10.5)

## 2010-11-29 LAB — BASIC METABOLIC PANEL
BUN: 24 mg/dL — ABNORMAL HIGH (ref 6–23)
GFR: 40.69 mL/min — ABNORMAL LOW (ref >60.00)
Glucose, Bld: 124 mg/dL — ABNORMAL HIGH (ref 70–99)
Potassium: 5.6 mEq/L — ABNORMAL HIGH (ref 3.5–5.1)

## 2010-11-29 LAB — LIPID PANEL
Total CHOL/HDL Ratio: 4
VLDL: 14.4 mg/dL (ref 0.0–40.0)

## 2010-11-29 LAB — HEPATIC FUNCTION PANEL
ALT: 9 U/L (ref 0–53)
AST: 19 U/L (ref 0–37)
Bilirubin, Direct: 0.1 mg/dL (ref 0.0–0.3)
Total Bilirubin: 0.6 mg/dL (ref 0.3–1.2)

## 2010-11-29 MED ORDER — AMLODIPINE BESY-BENAZEPRIL HCL 10-40 MG PO CAPS: 1.0000 | ORAL_CAPSULE | Freq: Every day | ORAL | Status: DC

## 2010-11-29 MED ORDER — OMEPRAZOLE 20 MG PO CPDR: 20.0000 mg | DELAYED_RELEASE_CAPSULE | Freq: Every day | ORAL | Status: DC

## 2010-11-29 MED ORDER — CLONIDINE HCL 0.2 MG PO TABS: 0.2000 mg | ORAL_TABLET | Freq: Two times a day (BID) | ORAL | Status: DC

## 2010-11-29 NOTE — Assessment & Plan Note (Signed)
Uncontrolled, so will adjust med to simpler regimen with clonidine .2 bid for overall hopeful better control as well;  Pt encouraged to follow low salt diet, check BP at home and next visit,  to f/u any worsening symptoms or concerns

## 2010-11-29 NOTE — Progress Notes (Signed)
Subjective:    Patient ID: [name], male    DOB: [birth year], 77 y.o.   MRN: [id number]  HPI Here for wellness and f/u;  Overall doing ok;  Pt denies CP, worsening SOB, DOE, wheezing, orthopnea, PND, worsening LE edema, palpitations, dizziness or syncope.  Pt denies neurological change such as new Headache, facial or extremity weakness.  Pt denies polydipsia, polyuria, or low sugar symptoms. Pt states overall good compliance with treatment and medications, good tolerability, and trying to follow lower cholesterol diet.  Pt denies worsening depressive symptoms, suicidal ideation or panic. No fever, wt loss, night sweats, loss of appetite, or other constitutional symptoms.  Pt states good ability with ADL's, low fall risk, home safety reviewed and adequate, no significant changes in hearing or vision, and occasionally active with exercise.  Does seem confused somewhat about his meds however, and mentions he sometimes takes all three of his clonidine in the AM, b/c he will forget the ones later in the day.    Past Medical History  Diagnosis Date  . DIABETES MELLITUS, TYPE II 12/24/2007  . HYPERLIPIDEMIA 03/24/2007  . ANEMIA-NOS 06/02/2007  . HEARING LOSS, LEFT EAR 01/18/2010  . HYPERTENSION 03/24/2007  . PULMONARY NODULE 06/02/2007  . STRICTURE, ESOPHAGEAL 06/02/2007  . GERD 06/02/2007  . GASTROENTERITIS, ACUTE 05/30/2010  . CONSTIPATION 01/18/2010  . CHOLELITHIASIS 06/02/2007  . RENAL INSUFFICIENCY 06/02/2007  . OSTEOARTHRITIS, KNEES, BILATERAL 01/26/2008  . FEVER UNSPECIFIED 01/18/2010  . Abdominal pain, right upper quadrant 06/12/2007  . EPIGASTRIC TENDERNESS 01/18/2010  . COLON CANCER, HX OF 03/24/2007  . NEPHROLITHIASIS, HX OF 06/02/2007   Past Surgical History  Procedure Date  . Small intestine surgery     reports that he has quit smoking. He does not have any smokeless tobacco history on file. He reports that he does not drink alcohol or use illicit drugs. family history includes Hypertension in  his other. No Known Allergies Current Outpatient Prescriptions on File Prior to Visit  Medication Sig Dispense Refill  . polyethylene glycol (MIRALAX) powder Take 17 g by mouth. Use as directed OTC once daily       . DISCONTD: amLODipine-benazepril (LOTREL) 5-20 MG per capsule Take 2 capsules by mouth daily.        Marland Kitchen DISCONTD: cloNIDine (CATAPRES) 0.1 MG tablet Take 0.1 mg by mouth 3 (three) times daily.        Marland Kitchen DISCONTD: omeprazole (PRILOSEC) 20 MG capsule Take 20 mg by mouth daily.         Review of Systems Review of Systems  Constitutional: Negative for diaphoresis, activity change, appetite change and unexpected weight change.  HENT: Negative for hearing loss, ear pain, facial swelling, mouth sores and neck stiffness.   Eyes: Negative for pain, redness and visual disturbance.  Respiratory: Negative for shortness of breath and wheezing.   Cardiovascular: Negative for chest pain and palpitations.  Gastrointestinal: Negative for diarrhea, blood in stool, abdominal distention and rectal pain.  Genitourinary: Negative for hematuria, flank pain and decreased urine volume.  Musculoskeletal: Negative for myalgias and joint swelling.  Skin: Negative for color change and wound.  Neurological: Negative for syncope and numbness.  Hematological: Negative for adenopathy.  Psychiatric/Behavioral: Negative for hallucinations, self-injury, decreased concentration and agitation.      Objective:   Physical ExamBP 180/70  Pulse 110  Temp(Src) 98.1 F (36.7 C) (Oral)  Ht 5\' 9"  (1.753 m)  Wt 147 lb 8 oz (66.906 kg)  BMI 21.78 kg/m2  SpO2 99% Physical Exam  VS noted Constitutional: Pt is oriented to person, place, and time. Appears well-developed and well-nourished.  HENT:  Head: Normocephalic and atraumatic.  Right Ear: External ear normal.  Left Ear: External ear normal.  Nose: Nose normal.  Mouth/Throat: Oropharynx is clear and moist.  Eyes: Conjunctivae and EOM are normal. Pupils are  equal, round, and reactive to light.  Neck: Normal range of motion. Neck supple. No JVD present. No tracheal deviation present.  Cardiovascular: Normal rate, regular rhythm, normal heart sounds and intact distal pulses.   Pulmonary/Chest: Effort normal and breath sounds normal.  Abdominal: Soft. Bowel sounds are normal. There is no tenderness. Musculoskeletal: Normal range of motion. Exhibits no edema.  Lymphadenopathy:  Has no cervical adenopathy.  Neurological: Pt is alert and oriented to person, place, and time. Pt has normal reflexes. No cranial nerve deficit.  Skin: Skin is warm and dry. No rash noted.  Psychiatric:  Has  normal mood and affect. Behavior is normal. Motor/dtr's intact         Assessment & Plan:

## 2010-11-29 NOTE — Assessment & Plan Note (Signed)

## 2010-11-29 NOTE — Patient Instructions (Addendum)
Stop the clonidine 0.1 mg three times per day Start the clonidine 0.2 mg twice per day (remember the second pill is taken in the evening, not both pills in the morning) Continue all other medications as before Please go to LAB in the Basement for the blood and/or urine tests to be done today Please call the number on the [facility] Card (the [employer] System) for results of testing in 2-3 days Please return in 1 month to re-check the  Blood pressure

## 2010-11-30 ENCOUNTER — Telehealth: Payer: Self-pay

## 2010-11-30 NOTE — Telephone Encounter (Signed)
Message copied by Margaret Pyle on Thu Nov 30, 2010 11:16 AM ------      Message from: Oliver Barre      Created: Thu Nov 30, 2010  8:56 AM       Left message on phone tree  - study negative, normal or stable except for mild elev K and kidney slowing On his current BP meds            Due to his recent difficulty understanding his meds, July Linam please have pt make appt in one wk (instead of one month) as we should try to change his amlodipine/benazepril

## 2010-11-30 NOTE — Telephone Encounter (Signed)
Left message on machine for pt to return my call  

## 2010-12-14 NOTE — Telephone Encounter (Signed)
I have left several message for this pt with no response, closing phone note until further contact from pt or ROV scheduled 12/28/2010

## 2010-12-28 ENCOUNTER — Ambulatory Visit (INDEPENDENT_AMBULATORY_CARE_PROVIDER_SITE_OTHER): Payer: Medicare Other | Admitting: Internal Medicine

## 2010-12-28 ENCOUNTER — Encounter: Payer: Self-pay | Admitting: Internal Medicine

## 2010-12-28 VITALS — BP 162/80 | HR 102 | Temp 97.5°F | Ht 65.0 in | Wt 148.5 lb

## 2010-12-28 DIAGNOSIS — N259 Disorder resulting from impaired renal tubular function, unspecified: Secondary | ICD-10-CM

## 2010-12-28 DIAGNOSIS — I10 Essential (primary) hypertension: Secondary | ICD-10-CM

## 2010-12-28 DIAGNOSIS — E785 Hyperlipidemia, unspecified: Secondary | ICD-10-CM

## 2010-12-28 DIAGNOSIS — E119 Type 2 diabetes mellitus without complications: Secondary | ICD-10-CM

## 2010-12-28 MED ORDER — METOPROLOL TARTRATE 50 MG PO TABS: 50.0000 mg | ORAL_TABLET | Freq: Two times a day (BID) | ORAL | Status: DC

## 2010-12-28 MED ORDER — AMLODIPINE BESYLATE 10 MG PO TABS: 10.0000 mg | ORAL_TABLET | Freq: Every day | ORAL | Status: DC

## 2010-12-28 NOTE — Assessment & Plan Note (Signed)
Uncontrolled with recent worsening renal function on ACE;  Will d/c the lotrel; to cont the clonidine, but also take amlodipine 10 qd, and add metoprolo 50 bid (HR ok today); f/u 3 wks and will need f/u bmet

## 2010-12-28 NOTE — Assessment & Plan Note (Signed)
Mild worsening recently, will re-check prior to next visit to reassess off the ACE

## 2010-12-28 NOTE — Patient Instructions (Signed)
Please stop the amlodipine-benazepril medication (lotrel) Take all new medications as prescribed  - the amlodipine 10 mg per day, and metoprolol 50 mg twice per day Continue all other medications as before, including the clonidine Please return in 3 wks with Lab testing done 3-5 days before that visit

## 2010-12-28 NOTE — Progress Notes (Signed)
  Subjective:    Patient ID: [name], male    DOB: 12/15/1924, 77 y.o.   MRN: [id number]  HPI  Here to f/u; overall doing ok,  Pt denies chest pain, increased sob or doe, wheezing, orthopnea, PND, increased LE swelling, palpitations, dizziness or syncope.  Pt denies new neurological symptoms such as new headache, or facial or extremity weakness or numbness   Pt denies polydipsia, polyuria   Pt states overall good compliance with meds, trying to follow lower cholesterol, diet, wt overall stable but little exercise however. Denies worsening reflux, dysphagia, abd pain, n/v, bowel change or blood. Past Medical History  Diagnosis Date  . DIABETES MELLITUS, TYPE II 12/24/2007  . HYPERLIPIDEMIA 03/24/2007  . ANEMIA-NOS 06/02/2007  . HEARING LOSS, LEFT EAR 01/18/2010  . HYPERTENSION 03/24/2007  . PULMONARY NODULE 06/02/2007  . STRICTURE, ESOPHAGEAL 06/02/2007  . GERD 06/02/2007  . GASTROENTERITIS, ACUTE 05/30/2010  . CONSTIPATION 01/18/2010  . CHOLELITHIASIS 06/02/2007  . RENAL INSUFFICIENCY 06/02/2007  . OSTEOARTHRITIS, KNEES, BILATERAL 01/26/2008  . FEVER UNSPECIFIED 01/18/2010  . Abdominal pain, right upper quadrant 06/12/2007  . EPIGASTRIC TENDERNESS 01/18/2010  . COLON CANCER, HX OF 03/24/2007  . NEPHROLITHIASIS, HX OF 06/02/2007   Past Surgical History  Procedure Date  . Small intestine surgery     reports that he has quit smoking. He does not have any smokeless tobacco history on file. He reports that he does not drink alcohol or use illicit drugs. family history includes Hypertension in his other. No Known Allergies Current Outpatient Prescriptions on File Prior to Visit  Medication Sig Dispense Refill  . cloNIDine (CATAPRES) 0.2 MG tablet Take 1 tablet (0.2 mg total) by mouth 2 (two) times daily.  60 tablet  11  . omeprazole (PRILOSEC) 20 MG capsule Take 1 capsule (20 mg total) by mouth daily.  90 capsule  3  . polyethylene glycol (MIRALAX) powder Take 17 g by mouth. Use as directed OTC once  daily       . DISCONTD: amLODipine-benazepril (LOTREL) 10-40 MG per capsule Take 1 capsule by mouth daily.  30 capsule  11   Review of Systems All otherwise neg per pt     Objective:   Physical Exam BP 162/80  Pulse 102  Temp(Src) 97.5 F (36.4 C) (Oral)  Ht 5\' 5"  (1.651 m)  Wt 148 lb 8 oz (67.359 kg)  BMI 24.71 kg/m2  SpO2 98% Physical Exam  VS noted Constitutional: Pt appears well-developed and well-nourished.  HENT: Head: Normocephalic.  Right Ear: External ear normal.  Left Ear: External ear normal.  Eyes: Conjunctivae and EOM are normal. Pupils are equal, round, and reactive to light.  Neck: Normal range of motion. Neck supple.  Cardiovascular: Normal rate and regular rhythm.   Pulmonary/Chest: Effort normal and breath sounds normal.  Neurological: Pt is alert. No cranial nerve deficit.  Skin: Skin is warm. No erythema.  Psychiatric: Pt behavior is normal. Thought content normal.         Assessment & Plan:

## 2010-12-28 NOTE — Assessment & Plan Note (Signed)
stable overall by hx and exam, most recent lab reviewed with pt, and pt to continue medical treatment as before  Lab Results  Component Value Date   HGBA1C 6.4 04/25/2010

## 2010-12-28 NOTE — Assessment & Plan Note (Signed)
stable overall by hx and exam, most recent lab reviewed with pt, and pt to continue medical treatment as before  Lab Results  Component Value Date   LDLCALC 99 11/29/2010

## 2011-01-02 NOTE — Consult Note (Signed)
NAME:  [name], [name]                ACCOUNT NO.:  [id number]   MEDICAL RECORD NO.:  [id number]          PATIENT TYPE:  INP   LOCATION:  3709                         FACILITY:  [facility]   PHYSICIAN:  Petra Kuba, M.D.    DATE OF BIRTH:  Jul 04, 1925   DATE OF CONSULTATION:  05/16/2007  DATE OF DISCHARGE:                                 CONSULTATION   TYPE OF CONSULT:  GI.   HISTORY:  The patient familiar to me, came to the examination with  abdominal pain and constipation.  He is doing better now.  Unfortunately  CT scan widely pulmonary mets with 1 big lymph node in the abdomen but  no obvious colon abnormality.  Last colonoscopy in 2004, which did not  reveal any abnormalities and we plan to do it in 2009.   PAST MEDICAL HISTORY:  1. Pertinent for colon cancer.  2. Hypertension.  3. Increased cholesterol.   MEDICATIONS:  1. Pravastatin.  2. Norvasc.  3. Benzopirin.   ALLERGIES:  HAS NO KNOWN ALLERGIES.   SOCIAL HISTORY:  Does not drink or smoke.  May have smoked in his teens  for a little bit.  Did work in a coal mine a little bit.   FAMILY HISTORY:  Noncontributory.   PHYSICAL EXAMINATION:  ABDOMEN:  Pertinent for his abdomen being soft,  nontender currently.   LABORATORY DATA:  Pertinent for normal CBC, normal coags, normal  chemistries, normal CEA, CA19 was up just a hair.  CT scan reviewed with  Dr. Allyson Sabal.   ASSESSMENT:  Probably widely metastatic mets in the lung with only 1 in  the abdomen, questionable etiology.   PLAN:  I think if we can get tissue via the CAT scan or needle biopsy,  that would be the best option.  After my reviewed with Dr. Allyson Sabal, we  will need to check and see if a formal chest CT was done.  Dr. Allyson Sabal did  not think so but the computer says they did.  The abdominal mass could  possibly be stuck as well, although more difficult.  I am not sure  colonoscopy would be the first test I would do, since the CAT scan does  not seem to show an obvious  colon  mass.  We will go ahead and add MiraLax for his constipation.  I have  discussed the above with Dr. Esmeralda Links.  My partners will follow this  weekend, but we are happy to proceed with a colonoscopy at the proper  time.  Possibly a bronchoscopy if CT-directed biopsy is not able to be  done.           ______________________________  Petra Kuba, M.D.     MEM/MEDQ  D:  05/16/2007  T:  05/17/2007  Job:  [id number]   cc:   Isabel Caprice, MD

## 2011-01-02 NOTE — Consult Note (Signed)
NAME:  [name], [name]                ACCOUNT NO.:  [id number]   MEDICAL RECORD NO.:  [id number]          PATIENT TYPE:  INP   LOCATION:  3709                         FACILITY:  [facility]   PHYSICIAN:  Casimiro Needle B. Sherene Sires, MD, FCCPDATE OF BIRTH:  30-Sep-1924   DATE OF CONSULTATION:  05/20/2007  DATE OF DISCHARGE:                                 CONSULTATION   REFERRING PHYSICIAN:  Valerie A. Felicity Coyer, MD   REASON FOR CONSULTATION:  Pulmonary nodules.   HISTORY:  This is an 77 year old black male with a history of colon  cancer stage III who was admitted on September 25 complaining of lower  abdominal pain and coughing up blood.  It was never really clear where  the blood was coming from or exactly how much blood was involved, but  when he was admitted to the hospital, mainly and findings consistent  with severe constipation.  His abdominal complaints have improved, and  he did not have significant anemia.   The problem is that on chest x-ray he was found to have multiple  pulmonary nodules which were confirmed on CT scan along with a mass  obscuring the inferior vena cava which is felt to represent probable  recurrent colon cancer.  Presently he says he feels back to normal.  He  admits he has not been feeling good overall lately and has lost a few  pounds but is not sure how much.  He seems very unsure about recent  details, even who is doctor is or how he gets to the doctor.  He was not  able to give much in terms of specific history at this point but denies  any pleuritic pain and denied to me that he had been actually coughing  up any blood at all.   He has developed increasing cough since hospitalization on ACE  inhibitors and was started on Avelox today empirically with no new  changes on chest x-ray.   PAST MEDICAL HISTORY:  1. Colon cancer status post resection and chemotherapy.  2. Hypertension.  3. Hyperlipidemia.   MEDICATIONS:  1. The patient presently is on ACE inhibitors  in the form of      lisinopril daily.  2. He is also on day #1 of Avelox.  3. He takes Norvasc 10 mg daily.  4. Sliding scale insulin.  5. Protonix 40 mg daily.  6. Zocor.   SOCIAL HISTORY:  He has never smoked.   FAMILY HISTORY:  Is negative for respiratory diseases   REVIEW OF SYSTEMS:  Review of Systems taken in detail and significant  for the absence of rheumatologic complaints.   PHYSICAL EXAMINATION:  GENERAL:  This is a frail elderly black male who  was very shaky in terms of recent history data or even knowing who his  doctor is.  (Apparently he does not drive a car and is taken to the  doctor by a friend but could not tell me even where the doctor was  located or what practice he was with.)  HEENT:  Unremarkable.  Oropharynx is clear.  He is edentulous.  NECK:  Supple without cervical adenopathy or tenderness.  Trachea is  midline.  LUNGS:  Lung fields reveal no significant cough on inspiratory or  expiratory maneuvers.  HEART:  There is a regular rate and rhythm without murmur, gallop, rub.  ABDOMEN:  Soft, benign, without palpable organomegaly or masses.  EXTREMITIES:  Warm without calf tenderness, cyanosis, clubbing, edema.   Chest x-ray today reveals obvious and numerous pulmonary nodules which  was confirmed on CT scan as outlined above.   CEA level was 1.0.  CA19-9, however, was 42. Hematocrit was 36.3%.  Creatinine 1.7.   IMPRESSION:  Multiple macroscopic pulmonary nodules that are new from  previous x-rays available, probably represent metastatic disease, most  likely for a primary colon cancer, despite the normal CEA levels. (The  only thing that would change my opinion on this would be if the CEA  level had been high prior to resection of the lesions, and I am not sure  about this detail.)  Although I entertained initially the possibility  that the nodules represented MAI, they are far to obvious  macroscopically for this diagnosis, and he has not had a  chronic illness  consistent with atypical tuberculosis.   On the other hand, there is nothing to be gained by an early diagnosis  of metastatic cancer in is a gentleman who appears to have general  geriatric failure to thrive and is becoming more frail to the point of  being housebound.   I would be happy to see the patient back in the office to entertain the  possibility of a transbronchial biopsy but at this point would not feel  him to be a good candidate for open lung biopsy based on what I perceive  to be significant risk with minimum benefit in terms of being able to  help this nice gentleman.  This opinion would be subject to change if  oncology feel strongly to the contrary, and I would have no problem if  oncology referred him directly on to Dr. Edwyna Shell for an open lung biopsy,  but I do not see the relative benefit at this point, and I do no  recommend it.   For treatment acutely now, I would recommend avoidance of ACE  inhibitors, 5 days of Avelox, Mucinex 2 p.o. b.i.d., and follow up in  the office in 2 weeks with a chest x-ray.      Charlaine Dalton. Sherene Sires, MD, [facility]  Electronically Signed     MBW/MEDQ  D:  05/20/2007  T:  05/21/2007  Job:  [id number]   cc:   Lennis P. Darrold Span, M.D.

## 2011-01-02 NOTE — H&P (Signed)
NAME:  [name], [name]                ACCOUNT NO.:  [id number]   MEDICAL RECORD NO.:  [id number]          PATIENT TYPE:  INP   LOCATION:  1824                         FACILITY:  [facility]   PHYSICIAN:  Hollice Espy, M.D.DATE OF BIRTH:  Feb 08, 1925   DATE OF ADMISSION:  05/15/2007  DATE OF DISCHARGE:                              HISTORY & PHYSICAL   PRIMARY CARE PHYSICIAN:  Corwin Levins, M.D.   CHIEF COMPLAINT:  Abdominal pain and hemoptysis.   HISTORY OF PRESENT ILLNESS:  The patient is an 77 year old African  American male with a past medical history of colon cancer status post  resection and treatment as well as hypertension, who for the last  several months has been having intermittent episodes of abdominal pain.  He tells me that area alternates between the right upper quadrant and  the left upper quadrant but has continued to be severe in nature  sometimes radiating to his back; however, today he started having  episodes of hemoptysis.  He felt quite weak and became concerned and  came into the emergency room.  Initially thought he may be having some  vomiting blood but he said that was not right and it was more coughing  up blood.  He also tells me he has not had a bowel movement in quite  some time.  He thinks it may be several weeks, although his family feels  it has not been that long.  When the patient came into the emergency room, he was found to be stable  blood pressure and respiratory rate with a 98% O2 sat; however, his  heart rate was found to be sinus tach at 122.  An EKG was done which  showed tachycardia but with unusual P axis possibly ectopic atrial  tachycardia.  Labs were ordered on the patient.  The patient was found to have a normal white count without shift, normal  hemoglobin, normal MCV.  His creatinine was elevated at 1.7 but with a  normal BUN indicating more perhaps of a chronic nature.  His albumin  level was 4.4, however, concerning his lipase level  and coags were  normal as well,  as were cardiac markers.  Because of the concern of a  possible pulmonary embolus, a CT scan of the chest was done which showed  numerous nodules in the lungs felt to be likely metastatic disease.  Because of the patient's previous history of colon cancer and his  abdominal pain, an abdominal CT was ordered without contrast given the  patient's renal function.  This however showed a poorly defined mass  obscuring the lower IVC.  Although IVC obstruction was not felt to be  likely, one could not be ruled out because of the lack of contrast.  It  was felt that likely the patient had recurrence of colon cancer or  different cancer which has metastasized to the lung.  Currently, the patient himself is doing okay.  He denies any headaches,  visual changes, dysphagia, chest pain, palpitations, shortness of  breath, wheezing, coughing.  He does complain of some intermittent  abdominal  pain but not severe.  No hematuria, dysuria.  No diarrhea.  He  does complain of some constipation which has been ongoing for some time.  No focal extremity numbness, weakness, or pain.   REVIEW OF SYSTEMS:  Otherwise negative.   PAST MEDICAL HISTORY:  1. Colon cancer, status post resection and treatment.  2. Hypertension.  3. Hyperlipidemia.   MEDICATIONS:  The patient is on:  1. Pravastatin 20.  2. Norvasc 10.  3. Benazepril 40.   He has no known drug allergies.   SOCIAL HISTORY:  He denies any tobacco, alcohol, or drug use.   FAMILY HISTORY:  Noncontributory.   PHYSICAL EXAMINATION:  VITAL SIGNS:  On admission temp 97, heart rate  122, blood pressure 134/79, respirations 22, O2 sat 98% on room air.  GENERAL:  He is alert and oriented x3 in no apparent distress.  HEENT:  Normocephalic atraumatic.  His mucous membranes are slightly  dry.  He has no carotid bruits.  HEART:  Regular rate and rhythm.  S1 S2.  LUNGS:  Clear to auscultation bilaterally.  ABDOMEN:  Soft.   Slightly distended.  Nontender.  Hypoactive bowel  sounds.  EXTREMITIES:  Showed no clubbing, cyanosis, or edema.   LABORATORY:  I have ordered a CEA level as well as an ABG on room air,  both of those are pending.  His white count is 5.6, H&H 12 and 36, MCV  92, platelet count 235, no shift.  Sodium 139, potassium 2.4, chloride  102, bicarb 27, BUN 16, creatinine 1.7, glucose 161.  LFTs are  unremarkable.  Lipase 24.  Coags normal.  Cardiac markers normal.  EKG  is as per HPI.  UA notes greater than 1,000 in glucose, total protein  30, 4 of urobilinogen, as well as rare bacteria and only 0-2 whites, 3-6  reds.  His chest x-ray notes development of innumerable tiny lung  nodules consider further evaluation with chest CT.  Chest and abdominal  CT show innumerable nodules in the lungs, probable mets, poorly defined  mass obscuring left IVC near site of colon resection, probably  metastatic colon cancer, less likely sarcoma.   ASSESSMENT/PLAN:  1. Abdominal mass with possibility of lung metastases, highly      suspicious for recurrence of colon cancer.  We will start by      checking a CEA level as well as a CA19-9 level.  We will also      discuss with Dr. Cyndie Chime about the possibility of recurrence      and treatment.  2. Sinus tachycardia.  The patient may be mildly dehydrated.  This      also may be some mild hypoxia.  We will check an ABG.  Results of      ABG to determine use of oxygen appropriately.  We will also gently      hydrate.  We will also repeat a CT with contrast after hydration.  3. Hypertension.  Continue medications.  4. Elevated glucose with glucose in the urine, possibly due to      diabetes.  This may be secondary to pancreatic mass.  We will check      a CA19-9 level.      Hollice Espy, M.D.  Electronically Signed     SKK/MEDQ  D:  05/15/2007  T:  05/15/2007  Job:  [id number]   cc:   Genene Churn. Cyndie Chime, M.D.  Corwin Levins, MD

## 2011-01-02 NOTE — Consult Note (Signed)
NAME:  [name], [name]                ACCOUNT NO.:  [id number]   MEDICAL RECORD NO.:  [id number]          PATIENT TYPE:  INP   LOCATION:  3709                         FACILITY:  [facility]   PHYSICIAN:  Lennis P. Darrold Span, M.D.DATE OF BIRTH:  November 06, 1924   DATE OF CONSULTATION:  05/16/2007  DATE OF DISCHARGE:                                 CONSULTATION   TYPE OF CONSULT:  Medical oncology.   REFERRING PHYSICIAN:  Hospitalist service   HISTORY:  The patient is an 77 year old gentleman seen in consult at the  request of hospitalist service with question of recurrent or metastatic  carcinoma. He was followed by Dr. 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  He has not been seen back at our  office since 2005.  The patient had surgery in about February of 2000 by  Dr. Leonie Man with reanastomosis, then adjuvant 5-FU leucovorin  chemotherapy by Dr. Cyndie Chime.  Our records prior to 2004, have been  requested but they are presently off site from our office.  He has had  repeat colonoscopy by Dr. Ewing Schlein at least in July 2001, with adenomatous  polyps seen, and in August 2004 which was unremarkable including the  anastomotic area per the hospital information.  The patient's primary  care has recently changed to Dr. Oliver Barre, who has been making  adjustments in his antihypertensives.   The patient presented to the [facility] emergency room on May 15, 2007, complaining of severe abdominal pain, no bowel movement in about 2  weeks and spitting up blood.  A CBC, coags, LFTs and CEA were all not  remarkable. Chest x-ray showed new immobile tiny lung nodules as  compared with chest x-ray of May 2008.  CT chest, abdomen and pelvis  showed multiple bilateral pulmonary nodules more prominent in the lung  basis and up to 8.5 mm, no adenopathy or effusions, no bony  abnormalities.  Liver,  spleen, pancreas and kidneys were all  unremarkable.  They saw soft-tissue mass between the abdominal aorta and  the inferior vena cava with some small periaortic nodes and the region  of the anastomosis in the same area.  The pelvis was not remarkable on  the CT except chronic degenerative changes.  The patient still has not  had a bowel movement though abdominal pain was better today.  He has not  had an enema or any stool softeners or laxatives since admission that I  can find charted.  He has continued with some cough which has been  productive of whitish sputum and 1 episode of some streaking blood in  the sputum also today.  He has eaten regular diet without vomiting.   REVIEW OF SYSTEMS:  The patient states that his weight about a month ago  at Dr. Raphael Gibney office was 155 pounds and tells me he weighs 142 pounds  now.  He had some shortness of breath prior to admission which seems  better today.  He was not aware of  any fever and was not having sweats.  He has had some pain in his hips bilaterally for the past several weeks  to the point that he has not been able to mow his grass.  He denies  headaches.  He has had some possible visible changes but has not had an  eye exam in years.  He has had no bleeding or history of clots.  He has  been voiding without difficulty.   ALLERGIES:  NO KNOWN DRUG ALLERGIES.   PAST MEDICAL HISTORY:  Sigmoid colon cancer, 12 nodes positive in  February 2002, surgery with reanastomosis by Dr. Lurene Shadow, hypertension  and lipid disorder.  A CT of the abdomen and pelvis done March 2005,  reportedly had clear lung bases.  The patient did have sinus tach at 122  in the emergency department.   FAMILY HISTORY:  Noncontributory.   SOCIAL HISTORY:  He is married lives in [place] with his wife and 53-  year-old granddaughter.  Only other family is a brother.  He has remote  history of cigarette use which was not heavy, discontinued over 20 years  ago.  He  is retired from [employer].   PHYSICAL EXAMINATION:  GENERAL:  He is a very pleasant gentleman who  looks younger than his stated age and appears comfortable in bed on room  air.  Wife is present and very supportive. They are good historians.  He  repositions himself easily in bed and is very cooperative.  He is not  obviously cachectic and is not icteric.  VITAL SIGNS:  Temperature 98.4, heart rate 72 and regular, respirations  18, blood pressure 131/74, room-air saturation 99%.  NECK:  No JVD.  No supraclavicular or cervical adenopathy.  LUNGS:  Have no wheezing, rale or crackles.  HEART:  Has a regular rate and rhythm.  ABDOMEN:  Has some bowel sounds present.  Soft and nontender.  Not  obviously distended.  No appreciable hepatosplenomegaly.  EXTREMITIES:  Lower extremities no edema, cords or tenderness. Moves his  extremities easily.  He has an IV in the left antecubital.   LABORATORY DATA:  From May 15, 2007, white count 5.6, hemoglobin  12, platelets 235, PT 14.4, INR 1.1, PTT 26, CEA 1.0.  Sodium 139,  potassium 3.4, chloride 102, CO2 27, glucose 161, BUN 16, creatinine  1.7, calcium 9.5, total protein 7.8, albumin 4.4, SGOT 19, SGPT 10.  Total bilirubin 0.7, alkaline phosphatase 68.   He has not had an cultures sent.   IMPRESSION/RECOMMENDATIONS:  1. Abdominal pain.  Some change in bowel habits with 2 weeks of      constipation.  Question changes in the region of the sigmoid      resection anastomotic site.  Need to get his bowels to move if      possible and be sure that he does not have an impaction.  I would      suggest repeat colonoscopy as first diagnostic attempt.  If nothing      is visualized on the colonoscopy, would ask interventional      radiology if the area in the region of the previous anastomosis can      be percutaneously biopsied.  2. Tiny pulmonary nodules, unclear etiology, probably too small for      PET to be helpful or to biopsy.  Would send  sputum culture,      consider empiric antibiotics and optimize pulmonary toilet.   Please call if our service can be  of help.  I will check back on him  next week if still in the hospital.  Dr. Cyndie Chime is away until  May 26, 2007.   Thank you for this consultation.      Lennis P. Darrold Span, M.D.  Electronically Signed    LPL/MEDQ  D:  05/16/2007  T:  05/17/2007  Job:  [id number]   cc:   Genene Churn. Cyndie Chime, M.D.  Leary Roca, MD  Leonie Man, M.D.  Corwin Levins, MD

## 2011-01-02 NOTE — Assessment & Plan Note (Signed)
[facility]                             PULMONARY OFFICE NOTE   ELFORD, EVILSIZER                         MRN:          [id number]  DATE:07/21/2007                            DOB:          [birth year]    HISTORY:  An 77 year old black male admitted to [facility] from  September 25 to May 28, 2007, with a complaint of constipation, cough  and hemoptysis.  His workup indicated extensive bilateral pulmonary  nodules with adenopathy consistent with metastatic cancer in a patient  who had had a history of colon cancer.  He improved symptomatically  after elimination of ACE inhibitors and empiric treatment for pneumonia  and returns today stating that he is the best he has been in a long  time, able to walk across the parking lot and up and down the aisles at  a grocery store without any significant difficulty in terms of dyspnea  or cough.  He is weak and states his appetite has been poor.  He has no  pain.  He denies any fevers, chills, sweats, or overt sinus or reflux  symptoms.   His only medicines at this point consist of pantoprazole one daily and  Avapro (not clear what dose he is on, however).   EXAMINATION:  He is a stoic, ambulatory black male at 132 pounds, which  is down 8 pounds from previous visit.  HEENT:  Unremarkable, oropharynx clear.  LUNG FIELDS:  Clear bilaterally to auscultation and percussion.  There is a regular rate and rhythm without murmur, gallop, rub.  ABDOMEN:  Soft, benign.  EXTREMITIES:  Warm without calf tenderness, cyanosis, clubbing, or  edema.   Hemoglobin saturation 96% on room air.   Chest x-ray pending.   IMPRESSION:  The pulmonary nodules are classic for metastatic disease in  this patient with history of colon cancer.  I understand his previous  CEAs have been normal and I am going to repeat one today.  The issue is  whether any form of early diagnosis would be of any benefit here, and  if so what  tissue to try to diagnosis (he also has evidence of liver  metastases).   I am certainly willing to attempt a blind biopsy but there is a  significant sampling error inherent in doing transbronchial biopsies  with isolated lung disease, albeit in this case quite numerous in terms  of tumor burden and therefore perhaps a bit higher yield.   The most expedient way to obtain a diagnosis is necessary would be to  proceed with a open lung biopsy or ct directed liver bx, which I  certainly would endorse if Dr. Cyndie Chime feels it would be in this  patient's interests in terms of making a specific diagnosis for further  treatment.     Charlaine Dalton. Sherene Sires, MD, [facility]  Electronically Signed    MBW/MedQ  DD: 07/21/2007  DT: 07/22/2007  Job #: [id number]   cc:   Genene Churn. Cyndie Chime, M.D.

## 2011-01-02 NOTE — Op Note (Signed)
NAME:  [name], [name]                ACCOUNT NO.:  [id number]   MEDICAL RECORD NO.:  [id number]          PATIENT TYPE:  INP   LOCATION:  3709                         FACILITY:  [facility]   PHYSICIAN:  Graylin Shiver, M.D.   DATE OF BIRTH:  1924/12/31   DATE OF PROCEDURE:  05/27/2007  DATE OF DISCHARGE:                               OPERATIVE REPORT   PROCEDURE:  Esophagogastroduodenoscopy with endoscopic balloon  dilatation of a Schatzki's ring.   INDICATIONS FOR PROCEDURE:  Dysphasia, abnormal barium swallow showing a  distal esophageal stenosis which looks like a Schatzki's ring.   PREMEDICATION:  Fentanyl 35 mcg IV, Versed 3.5 mg IV.   PROCEDURE:  With the patient in the left lateral decubitus position, the  Pentax gastroscope was inserted into the oropharynx and passed into the  esophagus. It was advanced down the esophagus then into the stomach and  into the duodenum.  The second portion and bulb of the duodenum normal.  The stomach had normal-appearing mucosa.  The scope was brought back up  into the esophagus.  There was some distal esophagitis noted and there  was a Schatzki's ring as noted on the barium swallow.  I went ahead and  proceed with endoscopic balloon dilatation of the Schatzki's ring  starting with 15 mm and 16.5 mm then 18 mm.  Each dilator size was held  in place for one minute.  There was some heme noted at the end of the  dilatation.  The rest of the esophagus in the mid and proximal portion  looked unremarkable.  She tolerated the procedure well without obvious  complications.   IMPRESSION:  1. Schatzki's ring dilated to 18 mm.  2. Distal esophagitis.   PLAN:  We will observe the response to the dilatation.  The patient  needs to remain on a proton pump inhibitor.           ______________________________  Graylin Shiver, M.D.     SFG/MEDQ  D:  05/27/2007  T:  05/28/2007  Job:  [id number]   cc:   Vikki Ports A. Felicity Coyer, MD

## 2011-01-02 NOTE — Discharge Summary (Signed)
NAME:  [name], [name]                ACCOUNT NO.:  [id number]   MEDICAL RECORD NO.:  [id number]          PATIENT TYPE:  INP   LOCATION:  3709                         FACILITY:  [facility]   PHYSICIAN:  Valerie A. Felicity Coyer, MDDATE OF BIRTH:  1924-10-16   DATE OF ADMISSION:  05/15/2007  DATE OF DISCHARGE:  05/28/2007                               DISCHARGE SUMMARY   DISCHARGE DIAGNOSES:  1. Multiple bilateral pulmonary nodules with abdominal mass at      aorta/inferior vena cava concerning for metastatic colon cancer.  2. Dysphagia secondary to Schatzki's ring status post endoscopy with      dilation May 27, 2007, with resolution of symptoms.  3. Obstipation resolved with MiraLax.  4. Fever, likely secondary to bronchitis and possible aspiration      events in the setting of dysphagia.   HISTORY OF PRESENT ILLNESS:  [name] is an 77 year old African  American male who was admitted on May 15, 2007, with chief  complaint of abdominal pain and hemoptysis.  He has a known history of  colon cancer status post resection and treatment.  The patient underwent  a CT angiogram of the chest on admission to rule out pulmonary embolus,  and although there was no sign of pulmonary embolus there were noted  numerous nodules in the lungs which were felt to be likely secondary to  metastatic disease.  The patient was admitted for further evaluation and  treatment.   PAST MEDICAL HISTORY:  1. Colon cancer status post resection and treatment.  2. Hypertension.  3. Hyperlipidemia.   COURSE OF HOSPITALIZATION:  #1 - MULTILEVEL BILATERAL PULMONARY NODULES  WITH ABDOMINAL MASS AT AORTA/INFERIOR VENA CAVA CONCERNING FOR  METASTATIC COLON CANCER.  The patient was admitted and was seen in  consultation by Dr. Darrold Span of oncology.  Her recommendations were that  the patient undergo a colonoscopy and that the GI team be contacted.  The patient apparently had not had a bowel movement in 2 weeks prior  to  this admission and it was felt that his abdominal pain was likely  secondary to obstipation, which has resolved with the addition of  MiraLax.   The patient was also seen in consultation by interventional radiology in  regards to possibility of a percutaneous biopsy of the pulmonary  nodules.  It was felt that the retroperitoneal soft tissue was unsafe to  biopsy and it was felt that fine-needle aspiration of one of the  peripheral lung nodules would likely have low yield and unlikely that an  accurate core biopsy could be performed.  Their recommendation at that  time was to obtain a thoracic surgery consultation to get their opinion  in regards to the feasibility of a surgical wedge nodule excision for  tissue diagnosis.  The patient was seen by Dr. Sherene Sires of pulmonology and  he did not feel that the patient would be a good candidate for  aggressive intervention such as a VATS biopsy to prove the diagnosis.  He also thought that there was no benefit to early diagnosis of  metastatic cancer.  His recommendation was  that the patient follow up as  an outpatient in 2 weeks for a chest x-ray.   The patient was seen by GI during this admission and underwent a  colonoscopy which noted several cecal polyps and a poor prep.  There was  no obvious mass noted.   #2 - DYSPHASIA SECONDARY TO SCHATZKI'S RING.  The patient did have an  episode of dysphagia during this admission while trying to eat chicken.  GI attempted to perform an endoscopy but could not pass the scope.  As a  result, the patient underwent a barium swallow and it was felt that  there was a moderate stricture at the gastroesophageal junction with  moderate dysmotility.  As a result, the patient underwent a followup  endoscopy with dilation of the Schatzki's ring and the distal esophagus.  The patient was also noted to have gastritis.  We will continue proton  pump inhibitor at time of discharge.   #3 - FEVER WITH BRONCHITIS.   The patient did have a low-grade fever  though there was no clear infiltrate on x-ray and his fever did respond  to the addition of oral Avelox.  Will continue Avelox for a total of 10  days.  The patient is currently afebrile and it is possible that he may  have had some element of aspiration in the setting of his dysphagia.   MEDICATIONS AT TIME OF DISCHARGE:  1. Pravastatin 20 mg p.o. daily.  2. Norvasc 10 mg p.o. daily.  3. Protonix 40 mg p.o. daily.  4. Avelox 400 mg p.o. daily for two additional days.  5. Avapro 150 mg p.o. daily.  6. Mucinex 1200 mg p.o. b.i.d.  7. MiraLax 17 g once daily in 8 ounces of water.  8. Aspirin 81 mg p.o. daily.   PERTINENT LABORATORIES AT TIME OF DISCHARGE:  Hemoglobin 9.9, hematocrit  30.  BUN 16, creatinine 1.58, sodium 136, potassium 4.5.   DISPOSITION:  The patient will be discharged to home.   FOLLOWUP:  The patient is scheduled to follow up with Dr. Oliver Barre on  Monday, October 13 at 1:30 p.m.  He is also scheduled to follow up with  Dr. Sherene Sires of pulmonary on Thursday, October 16 at 10:50 a.m.  He is  scheduled to follow up with Dr. Cephas Darby on Tuesday, October 21  at 3:30 p.m. for labs and 4 p.m. for an appointment.   The patient is instructed to keep all of his appointments and to call  Dr. Ewing Schlein should he develop difficulty swallowing.  He is to come to the  ER if dysphagia is severe.  He is also instructed to call Dr. Jonny Ruiz  should he develop weakness, fever over 101.      Sandford Craze, NP      Raenette Rover. Felicity Coyer, MD  Electronically Signed    MO/MEDQ  D:  05/28/2007  T:  05/28/2007  Job:  [id number]   cc:   Corwin Levins, MD  Genene Churn. Cyndie Chime, M.D.  Charlaine Dalton. Sherene Sires, MD, FCCP

## 2011-01-02 NOTE — Op Note (Signed)
NAME:  [name], JABLON                ACCOUNT NO.:  [id number]   MEDICAL RECORD NO.:  [id number]          PATIENT TYPE:  INP   LOCATION:  3709                         FACILITY:  [facility]   PHYSICIAN:  John C. Madilyn Fireman, M.D.    DATE OF BIRTH:  1925-05-19   DATE OF PROCEDURE:  05/23/2007  DATE OF DISCHARGE:                               OPERATIVE REPORT   INDICATIONS FOR PROCEDURE:  Remote history of colon cancer with  suspected lung metastases.   PROCEDURE:  To assess for colon primary or recurrence.   PROCEDURE:  The patient was placed in the left lateral decubitus  position and placed on the pulse monitor with continuous low-flow oxygen  delivered by nasal cannula.  He was sedated with 50 mcg IV fentanyl and  5 mg IV Versed.  Olympus video colonoscope was inserted into the rectum  and advanced into cecum confirmed by transillumination of McBurney's  point and visualization of ileocecal valve and appendiceal orifice.  Prep was generally poor.  Within the cecum, there were seen 3 polyps  ranging in size from 8 mm to 1.5 cm that were all removed by snare.  The  prep was poor in the cecum, making this difficult.  The prep was also  generally poor throughout the rest of the colon with a lot of  nontransparent liquid as well as viscous mucoid stool and solid  particles that clogged the scope, and I could not rule out lesions less  than 1-2 cm in diameter in all areas.  Otherwise, there were no further  polyps, masses, diverticula or other mucosal abnormalities seen.  The  scope was then withdrawn, and the patient returned to the recovery room  in stable condition.  He tolerated the procedure well, and there were no  immediate complications.   IMPRESSION:  1. Three colon polyps, no obvious mass.  2. Poor prep.   PLAN:  1. Further workup as indicated of pulmonary nodules.  2. Will await pathology on polyps.           ______________________________  Everardo All. Madilyn Fireman, M.D.     JCH/MEDQ  D:   05/23/2007  T:  05/24/2007  Job:  [id number]

## 2011-01-02 NOTE — Assessment & Plan Note (Signed)
[name]                             PULMONARY OFFICE NOTE   NAME:[name], [name]                         MRN:          [id number]  DATE:06/05/2007                            DOB:          Sep 05, 1924    HISTORY:  An 77 year old white male with recent onset choking which  was associated with esophageal dysfunction and ultimately required  dilatation.  He says he is somewhat better now, but continuing to cough  up white mucous and has increasing dyspnea over baseline to the point  where it is difficult for him to walk for any more than one room to the  next.  He denies any real variability or triggering mechanisms or  significant relief with previous inhalers.   PAST MEDICAL HISTORY:  1. Colon cancer.  2. Hypertension.  3. Hyperlipidemia.  4. Gastroesophageal reflux disease, status post upper endoscopy,      May 30, 2007.   ALLERGIES:  None known.   MEDICATIONS:  1. Benazepril 40 mg daily (I had recommended that he stop this at the      time of hospitalization, but he misunderstood and continues it).  2. Norvasc 10 mg daily.  3. Pravachol 20 mg daily.  4. Baby aspirin 1 daily.  5. Pantoprazole 40 mg q.a.m.  6. Avapro 150 mg daily.   SOCIAL HISTORY:  He quit smoking years ago.   FAMILY HISTORY:  Negative for respiratory diseases.   PHYSICAL EXAMINATION:  This is a pleasant, ambulatory white male in no  acute distress.  He is afebrile with normal vital signs.  HEENT:  He is quite hoarse.  He is edentulous, oropharynx is clear.  NECK:  Supple without cervical adenopathy or tenderness.  Trachea is  midline, no thyromegaly.  Lung fields reveal a few rhonchi bilaterally.  HEART:  Regular rhythm without murmurs, gallops, or rubs.  ABDOMEN:  Soft, benign.  EXTREMITIES:  Warm without calf tenderness, cyanosis, clubbing, or  edema.   IMPRESSION:  Dysphagia is significantly improved, status post dilatation  while on Protonix.  However, his  hoarseness has not improved and is  associated with increasing dyspnea which may be partly related to the  use of angiotensin-converting enzyme inhibitors, surreptitiously, as we  had intended to stop them.  I have asked him to continue the Avapro, in  fact, double it to 300 mg daily, and provided him samples of the  300/12.5 dosing that he can switch to once his Avapro prescription has  run out.   At this point, I have also stopped the calcium channel blocker for the  same reason I had recommended the angiotensin-converting enzyme  inhibitors be stopped, namely, because he continues to suffer from  apparent upper airway dysfunction with the effects of extra esophageal  reflux which may be partly acid related and partly not.  He needs to  stay on the PPI for this, but also avoid if possible medications that  interfere with les function such as CCB's.   Unfortunately, the most pressing issue is one that I am not going to be  able to  solve for him here in the pulmonary clinic, and that is what is  the nature of his multiple pulmonary nodules that are certainly  consistent with metastatic disease.  If Dr. Cyndie Chime feels that there  is a benefit to a early diagnosis of what is most likely metastatic  disease, I think the best way to sort through this would be to do an  open lung biopsy, although one alternative could be to attempt a  transbronchial biopsy (relatively low yield given the focal nature of  the process, but certainly could be worth attempting, and I discussed  the risks, benefits, and alternatives with the patient today.  He did  not seem really enthusiastic about pursuing this issue).   For now I recommend the above changes in medicine and follow up in 2  weeks with a chest x-ray to see what kind of progression is showing  in  terms of the pulmonary nodules.   The other option would be to refer him directly to Dr. Edwyna Shell for an  open lung biopsy by VATS.     Charlaine Dalton. Sherene Sires, MD, [facility]  Electronically Signed    MBW/MedQ  DD: 06/05/2007  DT: 06/06/2007  Job #: [id number]

## 2011-01-05 NOTE — Op Note (Signed)
   NAME:  [name], [name]                            ACCOUNT NO.:  [id number]   MEDICAL RECORD NO.:  [id number]                   PATIENT TYPE:  AMB   LOCATION:  ENDO                                 FACILITY:  [facility]   PHYSICIAN:  Petra Kuba, M.D.                 DATE OF BIRTH:  Sep 02, 1924   DATE OF PROCEDURE:  04/05/2003  DATE OF DISCHARGE:                                 OPERATIVE REPORT   PROCEDURE:  Colonoscopy.   INDICATIONS FOR PROCEDURE:  The patient with a personal history of colon  cancer and colon polyps, due for repeat  screening. Consent was signed after  the  risks, benefits, methods and options were thoroughly discussed multiple  times in the past.   MEDICATIONS:  Demerol 60, Versed 6.   DESCRIPTION OF PROCEDURE:  Rectal inspection is pertinent for small external  hemorrhoids. The digital examination  was negative. The video colonoscope  was inserted and easily advanced  around the colon to the cecum. On  insertion the lower to mid sigmoid anastomosis was seen. It seemed to be a  colon end to a colon side anastomosis. To advance to the cecum required  some abdominal  pressure but no position  changes.   No abnormalities were seen on insertion. The cecum was identified by the  appendiceal orifice and the ileocecal valve. The scope was slowly withdrawn.  The prep was adequate. On slow withdrawal back to the rectum, no  abnormalities were seen. Anorectal pullthrough and retroflexion confirmed  some hemorrhoids and some anal papillae.   The scope was reinserted  and advanced  a short way up the left side of the  colon past the anastomosis. Air was suctioned. The scope was removed. The  patient tolerated the procedure well. There were no obvious immediate  complications.   ENDOSCOPIC DIAGNOSES:  1. Internal and external hemorrhoids with an anal papilla.  2. Midsigmoid anastomosis, colon-side to colon-end.  3. Otherwise within normal limits to the cecum.    PLAN:   Yearly rectals and Guaiacs per either Dr. Cyndie Chime or Dr. Parke Simmers.  Happy to see back p.r.n. Is doing well medically. Consider repeat screening  in 5 years.                                               Petra Kuba, M.D.    MEM/MEDQ  D:  04/05/2003  T:  04/05/2003  Job:  [id number]   cc:   Renaye Rakers, M.D.  [phone] [address], [address]  [place]  [place] [address]  Fax: [phone]   Genene Churn. Cyndie Chime, M.D.  [address] [facility]  [place]  [place] [address]  Fax: [phone]

## 2011-01-05 NOTE — Discharge Summary (Signed)
[name], [name]                            ACCOUNT NO.:  [id number]   MEDICAL RECORD NO.:  [id number]                   PATIENT TYPE:  INP   LOCATION:  0280                                 [facility]ACILITY:  [facility]   PHYSICIAN:  Hettie Holstein, D.O.                 DATE OF BIRTH:  [birth year]   DATE OF ADMISSION:  10/30/2003  DATE OF DISCHARGE:  11/01/2003                                 DISCHARGE SUMMARY   DISCHARGE DIAGNOSES:  1. Intractable nausea and vomiting, abdominal distention, and mild ileus as     well febrile illness.  2. Renal insufficiency in the context of concomitant nonsteroidal anti-     inflammatory drugs and angiotensin II receptor blocker.   DISCHARGE DIAGNOSES:  1. Intractable nausea and vomiting, abdominal distention, and mild ileus as     well febrile illness; resolved.  2. Renal insufficiency in the context of concomitant nonsteroidal anti-     inflammatory drugs and angiotensin II receptor blocker; resolved.   Nausea and vomiting resolved. Febrile illness resolved. Renal insufficiency  resolving with recommendations to follow up with primary care physician for  repeat basic metabolic panel.   LABORATORY DATA:  Sodium 136, potassium 4, BUN 28, creatinine 1.9. TSH 1.8.   HISTORY OF PRESENT ILLNESS:  This is a 77 year old African American male  with a past medical history of colon cancer, status post colon resection,  radiation, chemotherapy several years ago. He has had no problems or  recurrence at this time. He has undergone routine colonoscopies including  one back in August 2004 and has done quite well. He has a history of  hypertension. No diabetes or chronic medical illnesses that he is aware of.  He woke in the morning, started having some abdominal discomfort that  persisted all evening. He had some nausea and vomiting and subsequently  presented to the emergency department and continued to have vomiting with  any p.o. intake. He underwent CT scan in the  ER with findings of mild  colonic ileus. Otherwise, he had a temperature spike of 102. He was noted to  be in acute renal failure based on the creatinine and it was not known;  however in the emergency department  it was 1.9.   HOSPITAL COURSE:  The patient was admitted to the medical floor and was  started on generous IV hydration therapy. Initially he was held NPO; however  on the following morning he was started on p.o. which he tolerated well. He  was voiding, he was nonoliguric. His routine labwork was unrevealing of the  focus of infection, he was ambulating, and clinically appeared resolved. On  further discussion with the patient it was noted that he had been taking  Sulindac as well as Lotrel and it was recommended that he refrain from  Sulindac and that he follow up with his primary care physician on November 16, 2003, at  1:45 p.m. I do recommend a follow-up basic metabolic panel at that  time to assess return of his renal function to baseline. In any event, this  hospital course was otherwise unremarkable.                                              Hettie Holstein, D.O.   ESS/MEDQ  D:  11/16/2003  T:  11/16/2003  Job:  [id number]   cc:   Primary Care Physician

## 2011-01-05 NOTE — H&P (Signed)
NAMEADRIN, [name]                            ACCOUNT NO.:  [id number]   MEDICAL RECORD NO.:  [id number]                   PATIENT TYPE:  INP   LOCATION:  0280                                 FACILITY:  [facility]   PHYSICIAN:  Hettie Holstein, D.O.                 DATE OF BIRTH:  [birth year]   DATE OF ADMISSION:  10/30/2003  DATE OF DISCHARGE:                                HISTORY & PHYSICAL   PRIMARY CARE PHYSICIAN:  Renaye Rakers, M.D.   CHIEF COMPLAINT:  Abdominal discomfort, nausea, and vomiting.   HISTORY OF PRESENT ILLNESS:  This is a pleasant 77 year old African-American  male with past medical history of colon cancer status post colon resection  and radiation chemotherapy several years ago who has not has problems or  recurrence since that time.  He has undergone routine colonoscopies  including one back in August of 2004 and he has done quite well.  He has a  history of hypertension.  No diabetes mellitus or other chronic medical  illnesses.  He woke up this morning and started having some abdominal  discomfort that persisted all evening and he felt some nausea and vomiting  and subsequently presented to the emergency department and continued to have  vomiting with any p.o. intake.  He underwent CT scanning in the ER with  findings of only mild colonic ileus, otherwise he had a temperature spike of  102 and was noted to be in acute renal failure.  Baseline creatinine not  known but in the ED it was 1.9.   PAST MEDICAL HISTORY:  As noted above, significant for history of colon  cancer diagnosed 7 to 8 years ago status post surgery by Dr. Lurene Shadow and  chemoradiation.  He has undergone recent colonoscopy within the past year.   SOCIAL HISTORY:  The patient is retired from [employer].  He denies any  alcohol or tobacco.  He lives with his wife.  Has one child.   FAMILY HISTORY:  The patient's mother died at age 33.  Had a history of  hypertension.  Father died at age not known to  the patient with medical  history of hypertension.   MEDICATIONS:  1. Lotrel 10/20 mg one p.o. daily.  2. Sulindac dosage not available at this time; however, he is taking two a     day.  3. Toprol-XL 100 mg daily.  4. Hydrochlorothiazide 25 mg daily.   He has no known drug allergies.   REVIEW OF SYSTEMS:  The patient denies known fevers, chills, or night sweats  though in the emergency department he was noted to have a temp of 102.  Prior to this incident, he has had no sustained weight loss, no loss of  appetite, no chest pain.  He does have occasional shortness of breath.  No  prior cardiac history.  No abdominal pain.  Last bowel movement was on  Friday.  He denies any hematochezia, melena, coffee-grounds emesis.  Denies  any dysuria.  Denies any swelling of his extremities.  Denies any other  associated complaints.  Further review of systems is negative.   PHYSICAL EXAMINATION:  VITAL SIGNS:  The patient's blood pressure was  127/80, temperature 102 in the emergency department oral.  Heart rate 102,  respirations 20.  O2 saturation 100% on room air.  GENERAL:  This a pleasant African-American male in no acute distress.  Nontoxic in appearance.  He is alert and oriented x3.  HEART:  Rate reveals a normal S1 & S2.  LUNGS:  Clear to auscultation bilaterally.  ABDOMEN:  Hypoactive bowel sounds.  There is slight distention but there is  no rebound or rigidity.  There is a midline surgical scar as well as status  post cholecystectomy scar.  No palpable abdominal defects or hernias.  EXTREMITIES:  Lower extremities reveal no edema.  Peripheral pulses are  symmetrical bilaterally.  NEUROLOGICAL:  Reveals the patient to be euthymic.  Affect was stable.   LABORATORY DATA:  The patient underwent CT scanning which revealed only  evidence of mild colonic ileus and no other acute findings. White count was  9.1, hemoglobin 12.5, hematocrit 37, platelet count 257.  Sodium 134,  potassium  4.7, chloride 101, CO2 25, BUN 22, creatinine 1.9.  His baseline  is unknown.  Calcium 9.3, AST/ALT 25/20.  UA was not remarkable for any  urinary tract infection.  Only 0-2 rbc's.   IMPRESSION:  1. Intractable nausea and vomiting.  2. Abdominal distention/discomfort, mild colonic ileus.  3. Febrile illness.  4. Renal failure in the context of concomitant nonsteroidal anti-     inflammatory drug use and angiotensin II receptor blocker.  He was taking     Lotrel which is a combination amlodipine benazepril.   PLAN:  At the time, we are going to admit [name] to medical service  floor, administer gentle IV hydration, hold nephrotoxic medications, follow  his BMP to resolution and continue his home medications with the exception  of Lotrel and hydrochlorothiazide.  We will continue his Toprol.  Will check  UA and follow his CBC, BMP and reassess him.                                               Hettie Holstein, D.O.    ESS/MEDQ  D:  10/31/2003  T:  10/31/2003  Job:  [id number]   cc:   Renaye Rakers, M.D.  [phone] [address]  [place]  [place] [address]  Fax: [phone]

## 2011-01-05 NOTE — Op Note (Signed)
[place]  Patient:    [name], [name]                         MRN: [id number] Proc. Date: 02/19/00 Adm. Date:  [id number] Attending:  Nelda Marseille CC:         Petra Kuba, M.D.             Turkey Rankins, [facility]             [place] L. Lurene Shadow, M.D.                           Operative Report  OPERATION/PROCEDURE:  Colonoscopy with hot biopsy.  MEDICATIONS USED:  Demerol 50 mg and Versed 5 mg.  SURGEON:  Petra Kuba, M.D.  INDICATION FOR PROCEDURE:  A patient with a history of colon cancer now due for repeat screening.  Consent was signed after risks, benefits, methods and options were thoroughly discussed in the office.  DESCRIPTION OF PROCEDURE:  Rectal inspections pertinent for external hemorrhoids.  Digital examination was negative.  The video colonoscope was inserted and easily pans around the colon to the cecum. This required some minimal left lower quadrant pressure but no position changes.  Cecum was identified by the appendiceal orifice and the ileocecal valve.  In fact, the scope was inserted short ways in the terminal ileum which was normal. Documentation was obtained.  The scope slowly withdrawn.  The prep was adequate.  There was some liquid stool that required washing and suctioning on slow withdrawal through the colon.  The cecum, ascending colon and transverse colon were normal.  In the mid descending, a 2 mm polyp was seen and was hot biopsied x 2.  The scope was slowly withdrawn to the distal sigmoid anastomosis which appeared normal.  The scope was withdrawn back to the rectum and retroflexed, pertinent for some internal hemorrhoids and some anal papillae.  The scope was drained and readvanced a fair ways around the colon. Air was suctioned and the scope was removed.  The patient tolerated the procedure well. There were no obvious immediate complications.  ENDOSCOPIC DIAGNOSES: 1. Internal and external  hemorrhoids. 2. Anastomosis at about 22 cm. 3. Left-side tiny polyp hot biopsy. 4. Otherwise within normal limits to the terminal ileum.  PLAN:  Yearly rectals, guaiacs, labs to include CEA and liver tests, and CBC per Dr. Barbaraann Barthel.  I would be happy to see her back p.r.n. and otherwise follow up in three years for colonoscopy unless needed sooner p.r.n. DD:  02/19/00 TD:  02/19/00 Job: [id number]

## 2011-01-18 ENCOUNTER — Encounter: Payer: Self-pay | Admitting: Internal Medicine

## 2011-01-18 ENCOUNTER — Ambulatory Visit (INDEPENDENT_AMBULATORY_CARE_PROVIDER_SITE_OTHER): Payer: Medicare Other | Admitting: Internal Medicine

## 2011-01-18 VITALS — BP 152/72 | HR 74 | Temp 98.2°F | Ht 65.0 in | Wt 148.2 lb

## 2011-01-18 DIAGNOSIS — I10 Essential (primary) hypertension: Secondary | ICD-10-CM

## 2011-01-18 DIAGNOSIS — E119 Type 2 diabetes mellitus without complications: Secondary | ICD-10-CM

## 2011-01-18 DIAGNOSIS — E785 Hyperlipidemia, unspecified: Secondary | ICD-10-CM

## 2011-01-18 NOTE — Assessment & Plan Note (Signed)
stable overall by hx and exam, most recent data reviewed with pt, and pt to continue medical treatment as before  Lab Results  Component Value Date   LDLCALC 99 11/29/2010

## 2011-01-18 NOTE — Assessment & Plan Note (Addendum)
Improved overall but Mr [name] is illiterate and misunderstood the verbal and written instructions from last visit, and has been taking the metoprolol only qam;  We went over the instructions today, and I asked him to have his wife read the bottles at home and make sure he is taking the approp meds correctly;  Will cont all meds as is o/w today  BP Readings from Last 3 Encounters:  01/18/11 152/72  12/28/10 162/80  11/29/10 180/70

## 2011-01-18 NOTE — Assessment & Plan Note (Signed)
stable overall by hx and exam, most recent data reviewed with pt, and pt to continue medical treatment as before  Lab Results  Component Value Date   HGBA1C 6.4 04/25/2010    

## 2011-01-18 NOTE — Patient Instructions (Signed)
Please take your medications as prescribed:     The amlodipine 10 mg is once in the morning only      The clonidine AND metoprolol are twice per day  Continue all other medications as before Please return in 4 weeks

## 2011-01-18 NOTE — Progress Notes (Signed)
  Subjective:    Patient ID: [name], male    DOB: 1925/03/08, 77 y.o.   MRN: [id number]  HPI  Here to f/u - overall doing ok; Pt denies chest pain, increased sob or doe, wheezing, orthopnea, PND, increased LE swelling, palpitations, dizziness or syncope.  Pt denies new neurological symptoms such as new headache, or facial or extremity weakness or numbness   Pt denies polydipsia, polyuria. But unfortunately misunderstood verbal and written instructions last visit - he is illiterate though wife can read;  He has only been taking the metoprolol once per day in the AM.   Pt denies fever, wt loss, night sweats, loss of appetite, or other constitutional symptoms  Denies worsening depressive symptoms, suicidal ideation, or panic, though has ongoing anxiety, not increased recently.   No worsening memory per pt Past Medical History  Diagnosis Date  . DIABETES MELLITUS, TYPE II 12/24/2007  . HYPERLIPIDEMIA 03/24/2007  . ANEMIA-NOS 06/02/2007  . HEARING LOSS, LEFT EAR 01/18/2010  . HYPERTENSION 03/24/2007  . PULMONARY NODULE 06/02/2007  . STRICTURE, ESOPHAGEAL 06/02/2007  . GERD 06/02/2007  . GASTROENTERITIS, ACUTE 05/30/2010  . CONSTIPATION 01/18/2010  . CHOLELITHIASIS 06/02/2007  . RENAL INSUFFICIENCY 06/02/2007  . OSTEOARTHRITIS, KNEES, BILATERAL 01/26/2008  . FEVER UNSPECIFIED 01/18/2010  . Abdominal pain, right upper quadrant 06/12/2007  . EPIGASTRIC TENDERNESS 01/18/2010  . COLON CANCER, HX OF 03/24/2007  . NEPHROLITHIASIS, HX OF 06/02/2007   Past Surgical History  Procedure Date  . Small intestine surgery     reports that he has quit smoking. He does not have any smokeless tobacco history on file. He reports that he does not drink alcohol or use illicit drugs. family history includes Hypertension in his other. No Known Allergies Current Outpatient Prescriptions on File Prior to Visit  Medication Sig Dispense Refill  . amLODipine (NORVASC) 10 MG tablet Take 1 tablet (10 mg total) by mouth daily.  30  tablet  11  . cloNIDine (CATAPRES) 0.2 MG tablet Take 1 tablet (0.2 mg total) by mouth 2 (two) times daily.  60 tablet  11  . metoprolol (LOPRESSOR) 50 MG tablet Take 1 tablet (50 mg total) by mouth 2 (two) times daily.  60 tablet  11  . omeprazole (PRILOSEC) 20 MG capsule Take 1 capsule (20 mg total) by mouth daily.  90 capsule  3  . polyethylene glycol (MIRALAX) powder Take 17 g by mouth. Use as directed OTC once daily        Review of Systems All otherwise neg per pt     Objective:   Physical Exam BP 152/72  Pulse 74  Temp(Src) 98.2 F (36.8 C) (Oral)  Ht 5\' 5"  (1.651 m)  Wt 148 lb 4 oz (67.246 kg)  BMI 24.67 kg/m2  SpO2 97% Physical Exam  VS noted Constitutional: Pt appears well-developed and well-nourished.  HENT: Head: Normocephalic.  Right Ear: External ear normal.  Left Ear: External ear normal.  Eyes: Conjunctivae and EOM are normal. Pupils are equal, round, and reactive to light.  Neck: Normal range of motion. Neck supple.  Cardiovascular: Normal rate and regular rhythm.   Pulmonary/Chest: Effort normal and breath sounds normal.  Neurological: Pt is alert. No cranial nerve deficit.  Skin: Skin is warm. No erythema.  Psychiatric: Pt behavior is normal. Thought content normal.        Assessment & Plan:

## 2011-02-15 ENCOUNTER — Encounter: Payer: Self-pay | Admitting: Internal Medicine

## 2011-02-15 ENCOUNTER — Ambulatory Visit (INDEPENDENT_AMBULATORY_CARE_PROVIDER_SITE_OTHER): Payer: Medicare Other | Admitting: Internal Medicine

## 2011-02-15 VITALS — BP 140/78 | HR 66 | Temp 98.5°F | Ht 65.0 in | Wt 144.5 lb

## 2011-02-15 DIAGNOSIS — E785 Hyperlipidemia, unspecified: Secondary | ICD-10-CM

## 2011-02-15 DIAGNOSIS — E119 Type 2 diabetes mellitus without complications: Secondary | ICD-10-CM

## 2011-02-15 DIAGNOSIS — N259 Disorder resulting from impaired renal tubular function, unspecified: Secondary | ICD-10-CM

## 2011-02-15 DIAGNOSIS — I10 Essential (primary) hypertension: Secondary | ICD-10-CM

## 2011-02-15 NOTE — Patient Instructions (Addendum)
Continue all other medications as before Please return in 6 mo with Lab testing done 3-5 days before OK to cancel the June 28 appt

## 2011-02-15 NOTE — Assessment & Plan Note (Signed)
D/w pt, declines renal referral at this time, cont current tx,  to f/u any worsening symptoms or concerns

## 2011-02-15 NOTE — Assessment & Plan Note (Signed)
stable overall by hx and exam, most recent data reviewed with pt, and pt to continue medical treatment as before Lab Results  Component Value Date   LDLCALC 99 11/29/2010    

## 2011-02-15 NOTE — Assessment & Plan Note (Signed)
Improved, now with better med understanding and good complaicne, Continue all other medications as before,  to f/u any worsening symptoms or concerns  BP Readings from Last 3 Encounters:  02/15/11 140/78  01/18/11 152/72  12/28/10 162/80

## 2011-02-15 NOTE — Progress Notes (Signed)
  Subjective:    Patient ID: [name], male    DOB: July 05, 1925, 77 y.o.   MRN: [id number]  HPI  Here to f/u; overall doing ok,  Pt denies chest pain, increased sob or doe, wheezing, orthopnea, PND, increased LE swelling, palpitations, dizziness or syncope.  Pt denies new neurological symptoms such as new headache, or facial or extremity weakness or numbness   Pt denies polydipsia, polyuria, or low sugar symptoms such as weakness or confusion improved with po intake.  Pt states overall good compliance with meds, trying to follow lower cholesterol, diabetic diet, wt overall stable but little exercise however.  HA from elev BP last visit has resolved. No new complaints Past Medical History  Diagnosis Date  . DIABETES MELLITUS, TYPE II 12/24/2007  . HYPERLIPIDEMIA 03/24/2007  . ANEMIA-NOS 06/02/2007  . HEARING LOSS, LEFT EAR 01/18/2010  . HYPERTENSION 03/24/2007  . PULMONARY NODULE 06/02/2007  . STRICTURE, ESOPHAGEAL 06/02/2007  . GERD 06/02/2007  . GASTROENTERITIS, ACUTE 05/30/2010  . CONSTIPATION 01/18/2010  . CHOLELITHIASIS 06/02/2007  . RENAL INSUFFICIENCY 06/02/2007  . OSTEOARTHRITIS, KNEES, BILATERAL 01/26/2008  . FEVER UNSPECIFIED 01/18/2010  . Abdominal pain, right upper quadrant 06/12/2007  . EPIGASTRIC TENDERNESS 01/18/2010  . COLON CANCER, HX OF 03/24/2007  . NEPHROLITHIASIS, HX OF 06/02/2007   Past Surgical History  Procedure Date  . Small intestine surgery     reports that he has quit smoking. He does not have any smokeless tobacco history on file. He reports that he does not drink alcohol or use illicit drugs. family history includes Hypertension in his other. No Known Allergies Current Outpatient Prescriptions on File Prior to Visit  Medication Sig Dispense Refill  . amLODipine (NORVASC) 10 MG tablet Take 1 tablet (10 mg total) by mouth daily.  30 tablet  11  . cloNIDine (CATAPRES) 0.2 MG tablet Take 1 tablet (0.2 mg total) by mouth 2 (two) times daily.  60 tablet  11  . metoprolol  (LOPRESSOR) 50 MG tablet Take 1 tablet (50 mg total) by mouth 2 (two) times daily.  60 tablet  11  . omeprazole (PRILOSEC) 20 MG capsule Take 1 capsule (20 mg total) by mouth daily.  90 capsule  3  . polyethylene glycol (MIRALAX) powder Take 17 g by mouth. Use as directed OTC once daily        Review of Systems Review of Systems  Constitutional: Negative for diaphoresis and unexpected weight change.  HENT: Negative for drooling and tinnitus.   Eyes: Negative for photophobia and visual disturbance.  Respiratory: Negative for choking and stridor.        Objective:   Physical Exam BP 140/78  Pulse 66  Temp(Src) 98.5 F (36.9 C) (Oral)  Ht 5\' 5"  (1.651 m)  Wt 144 lb 8 oz (65.545 kg)  BMI 24.05 kg/m2  SpO2 98% Physical Exam  VS noted Constitutional: Pt appears well-developed and well-nourished.  HENT: Head: Normocephalic.  Right Ear: External ear normal.  Left Ear: External ear normal.  Eyes: Conjunctivae and EOM are normal. Pupils are equal, round, and reactive to light.  Neck: Normal range of motion. Neck supple.  Cardiovascular: Normal rate and regular rhythm.   Pulmonary/Chest: Effort normal and breath sounds normal.  Neurological: Pt is alert. No cranial nerve deficit.  Skin: Skin is warm. No erythema.  Psychiatric: Pt behavior is normal. Thought content normal.         Assessment & Plan:

## 2011-02-15 NOTE — Assessment & Plan Note (Signed)
stable overall by hx and exam, most recent data reviewed with pt, and pt to continue medical treatment as before  Lab Results  Component Value Date   HGBA1C 6.4 04/25/2010

## 2011-05-31 LAB — COMPREHENSIVE METABOLIC PANEL
ALT: 10
Albumin: 4.4
Alkaline Phosphatase: 68
BUN: 16
Chloride: 102
Potassium: 3.4 — ABNORMAL LOW
Sodium: 139
Total Bilirubin: 0.7

## 2011-05-31 LAB — URINE MICROSCOPIC-ADD ON

## 2011-05-31 LAB — DIFFERENTIAL
Basophils Absolute: 0
Basophils Relative: 0
Eosinophils Absolute: 0.3
Eosinophils Relative: 5
Monocytes Absolute: 0.5
Neutro Abs: 3.9

## 2011-05-31 LAB — URINE CULTURE

## 2011-05-31 LAB — CBC
HCT: 31.9 — ABNORMAL LOW
HCT: 32.2 — ABNORMAL LOW
HCT: 36.3 — ABNORMAL LOW
Hemoglobin: 10.5 — ABNORMAL LOW
Hemoglobin: 12 — ABNORMAL LOW
MCV: 92.3
MCV: 92.5
Platelets: 210
Platelets: 216
Platelets: 217
Platelets: 235
RDW: 13.3
WBC: 5.7
WBC: 6.9
WBC: 5.6

## 2011-05-31 LAB — POCT CARDIAC MARKERS
CKMB, poc: 1.6
CKMB, poc: 1.7
Myoglobin, poc: 133
Troponin i, poc: <0.05
Troponin i, poc: <0.05

## 2011-05-31 LAB — URINALYSIS, ROUTINE W REFLEX MICROSCOPIC
Hgb urine dipstick: NEGATIVE
Nitrite: NEGATIVE
Nitrite: NEGATIVE
Protein, ur: 30 — AB
Specific Gravity, Urine: 1.026
Specific Gravity, Urine: 1.022
Urobilinogen, UA: 2 — ABNORMAL HIGH
Urobilinogen, UA: 4 — ABNORMAL HIGH
pH: 5.5

## 2011-05-31 LAB — BLOOD GAS, ARTERIAL
Acid-Base Excess: 1
FIO2: 0.21
O2 Saturation: 96.9
pCO2 arterial: 38
pO2, Arterial: 82.7

## 2011-05-31 LAB — BASIC METABOLIC PANEL
BUN: 16
Creatinine, Ser: 1.58 — ABNORMAL HIGH
GFR calc non Af Amer: 42 — ABNORMAL LOW

## 2011-05-31 LAB — CANCER ANTIGEN 19-9: CA 19-9: 42.3 — ABNORMAL HIGH (ref <35.0)

## 2011-05-31 LAB — CEA: CEA: 1

## 2011-08-16 ENCOUNTER — Other Ambulatory Visit (INDEPENDENT_AMBULATORY_CARE_PROVIDER_SITE_OTHER): Payer: Medicare Other

## 2011-08-16 ENCOUNTER — Encounter: Payer: Self-pay | Admitting: Internal Medicine

## 2011-08-16 ENCOUNTER — Ambulatory Visit (INDEPENDENT_AMBULATORY_CARE_PROVIDER_SITE_OTHER): Payer: Medicare Other | Admitting: Internal Medicine

## 2011-08-16 VITALS — BP 144/80 | HR 69 | Temp 97.6°F | Ht 69.0 in | Wt 148.5 lb

## 2011-08-16 DIAGNOSIS — Z0000 Encounter for general adult medical examination without abnormal findings: Secondary | ICD-10-CM

## 2011-08-16 DIAGNOSIS — Z23 Encounter for immunization: Secondary | ICD-10-CM

## 2011-08-16 DIAGNOSIS — E119 Type 2 diabetes mellitus without complications: Secondary | ICD-10-CM

## 2011-08-16 DIAGNOSIS — N259 Disorder resulting from impaired renal tubular function, unspecified: Secondary | ICD-10-CM

## 2011-08-16 DIAGNOSIS — E785 Hyperlipidemia, unspecified: Secondary | ICD-10-CM

## 2011-08-16 DIAGNOSIS — I10 Essential (primary) hypertension: Secondary | ICD-10-CM

## 2011-08-16 LAB — BASIC METABOLIC PANEL
BUN: 28 mg/dL — ABNORMAL HIGH (ref 6–23)
Calcium: 8.9 mg/dL (ref 8.4–10.5)
Creatinine, Ser: 1.8 mg/dL — ABNORMAL HIGH (ref 0.4–1.5)

## 2011-08-16 LAB — LIPID PANEL
Cholesterol: 148 mg/dL (ref 0–200)
HDL: 36.7 mg/dL — ABNORMAL LOW (ref >39.00)
LDL Cholesterol: 90 mg/dL (ref 0–99)
Total CHOL/HDL Ratio: 4
Triglycerides: 106 mg/dL (ref 0.0–149.0)
VLDL: 21.2 mg/dL (ref 0.0–40.0)

## 2011-08-16 NOTE — Assessment & Plan Note (Signed)
Mild elev but stable overall by hx and exam, most recent data reviewed with pt, and pt to continue medical treatment as before, declines change of med today though persistently elev and not at goal BP Readings from Last 3 Encounters:  08/16/11 144/80  02/15/11 140/78  01/18/11 152/72

## 2011-08-16 NOTE — Progress Notes (Signed)
Subjective:    Patient ID: [name], male    DOB: 1924/12/02, 77 y.o.   MRN: [id number]  HPI  Here to f/u; overall doing ok,  Pt denies chest pain, increased sob or doe, wheezing, orthopnea, PND, increased LE swelling, palpitations, dizziness or syncope.  Pt denies new neurological symptoms such as new headache, or facial or extremity weakness or numbness   Pt denies polydipsia, polyuria, or low sugar symptoms such as weakness or confusion improved with po intake.  Pt states overall good compliance with meds, trying to follow lower cholesterol, diabetic diet, wt overall stable but little exercise however. No acute complaints.  BP at home and the drug stores usually < 140/90, does not want med change today.  Due for flu shot.   Pt denies fever, wt loss, night sweats, loss of appetite, or other constitutional symptoms  Gait intact, no recent falls, no chronic pain. Denies worsening depressive symptoms, suicidal ideation, or panic. Past Medical History  Diagnosis Date  . DIABETES MELLITUS, TYPE II 12/24/2007  . HYPERLIPIDEMIA 03/24/2007  . ANEMIA-NOS 06/02/2007  . HEARING LOSS, LEFT EAR 01/18/2010  . HYPERTENSION 03/24/2007  . PULMONARY NODULE 06/02/2007  . STRICTURE, ESOPHAGEAL 06/02/2007  . GERD 06/02/2007  . GASTROENTERITIS, ACUTE 05/30/2010  . CONSTIPATION 01/18/2010  . CHOLELITHIASIS 06/02/2007  . RENAL INSUFFICIENCY 06/02/2007  . OSTEOARTHRITIS, KNEES, BILATERAL 01/26/2008  . FEVER UNSPECIFIED 01/18/2010  . Abdominal pain, right upper quadrant 06/12/2007  . EPIGASTRIC TENDERNESS 01/18/2010  . COLON CANCER, HX OF 03/24/2007  . NEPHROLITHIASIS, HX OF 06/02/2007   Past Surgical History  Procedure Date  . Small intestine surgery     reports that he has quit smoking. He does not have any smokeless tobacco history on file. He reports that he does not drink alcohol or use illicit drugs. family history includes Hypertension in his other. No Known Allergies Current Outpatient Prescriptions on File Prior  to Visit  Medication Sig Dispense Refill  . amLODipine (NORVASC) 10 MG tablet Take 1 tablet (10 mg total) by mouth daily.  30 tablet  11  . cloNIDine (CATAPRES) 0.2 MG tablet Take 1 tablet (0.2 mg total) by mouth 2 (two) times daily.  60 tablet  11  . metoprolol (LOPRESSOR) 50 MG tablet Take 1 tablet (50 mg total) by mouth 2 (two) times daily.  60 tablet  11  . omeprazole (PRILOSEC) 20 MG capsule Take 1 capsule (20 mg total) by mouth daily.  90 capsule  3  . polyethylene glycol (MIRALAX) powder Take 17 g by mouth. Use as directed OTC once daily        Review of Systems Review of Systems  Constitutional: Negative for diaphoresis and unexpected weight change.  HENT: Negative for drooling and tinnitus.   Eyes: Negative for photophobia and visual disturbance.  Respiratory: Negative for choking and stridor.   Gastrointestinal: Negative for vomiting and blood in stool.  Genitourinary: Negative for hematuria and decreased urine volume.      Objective:   Physical Exam BP 144/80  Pulse 69  Temp(Src) 97.6 F (36.4 C) (Oral)  Ht 5\' 9"  (1.753 m)  Wt 148 lb 8 oz (67.359 kg)  BMI 21.93 kg/m2  SpO2 99% Physical Exam  VS noted Constitutional: Pt appears well-developed and well-nourished.  HENT: Head: Normocephalic.  Right Ear: External ear normal.  Left Ear: External ear normal.  Eyes: Conjunctivae and EOM are normal. Pupils are equal, round, and reactive to light.  Neck: Normal range of motion. Neck supple.  Cardiovascular:  Normal rate and regular rhythm.   Pulmonary/Chest: Effort normal and breath sounds normal.  Abd:  Soft, NT, non-distended, + BS Neurological: Pt is alert. No cranial nerve deficit.  Skin: Skin is warm. No erythema.  Psychiatric: Pt behavior is normal. Thought content normal.     Assessment & Plan:

## 2011-08-16 NOTE — Assessment & Plan Note (Signed)
Volume stable overall by hx and exam, most recent data reviewed with pt, and pt to continue medical treatment as before, to check bun/cr today, cont same tx Lab Results  Component Value Date   CREATININE 2.0* 11/29/2010

## 2011-08-16 NOTE — Assessment & Plan Note (Signed)
stable overall by hx and exam, most recent data reviewed with pt, and pt to continue medical treatment as before and current diet control, to check a1c Lab Results  Component Value Date   HGBA1C 6.4 04/25/2010

## 2011-08-16 NOTE — Assessment & Plan Note (Signed)
stable overall by hx and exam, most recent data reviewed with pt, and pt to continue medical treatment as before Lab Results  Component Value Date   LDLCALC 99 11/29/2010

## 2011-08-16 NOTE — Patient Instructions (Signed)
You had the flu shot today Continue all other medications as before Please go to LAB in the Basement for the blood and/or urine tests to be done today Please call the phone number [phone] (the [employer] System) for results of testing in 2-3 days;  When calling, simply dial the number, and when prompted enter the MRN number above (the Medical Record Number) and the # key, then the message should start. Please return in 6 mo with Lab testing done 3-5 days before  

## 2011-09-23 IMAGING — CR DG CHEST 2V
2 series · 2 of 2 positions shown · non-contrast
Comparison: 09/01/2009.

CLINICAL DATA: Cough.  Hypertension.

CHEST - 2 VIEW

[view not recorded (1 of 2)]
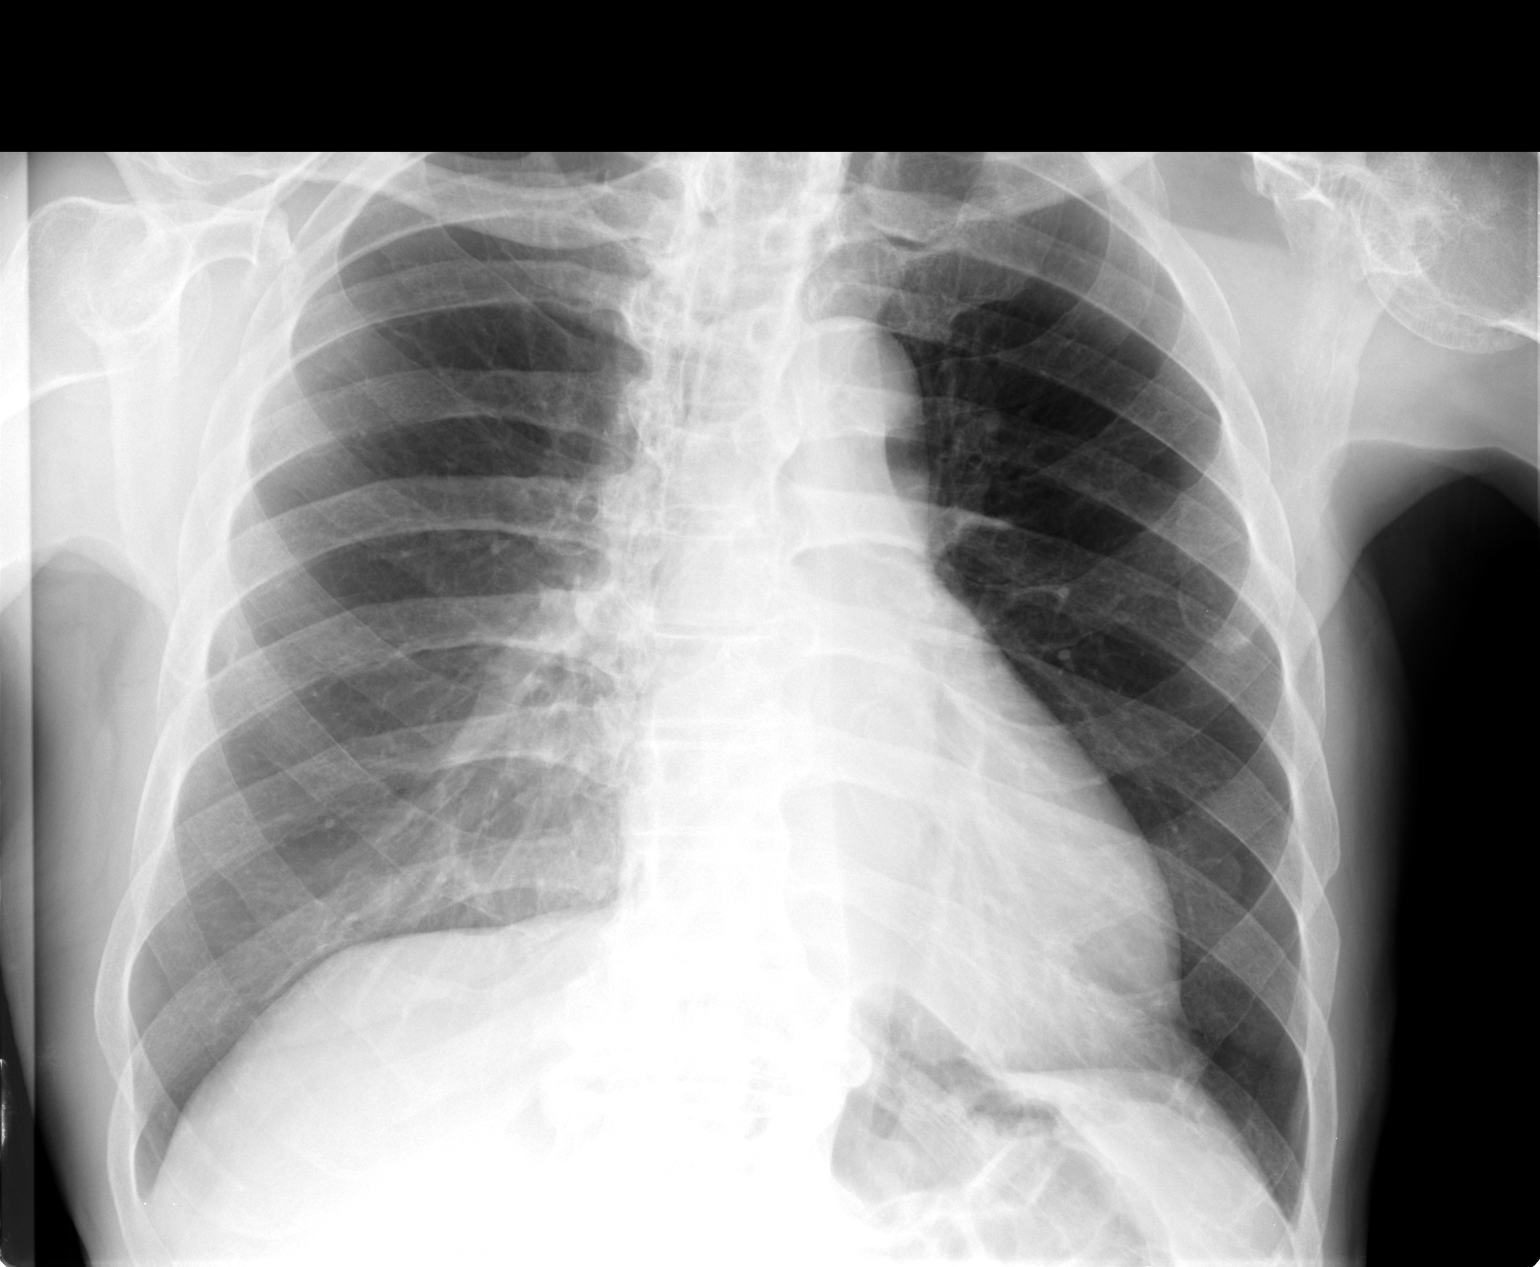

[view not recorded (2 of 2)]
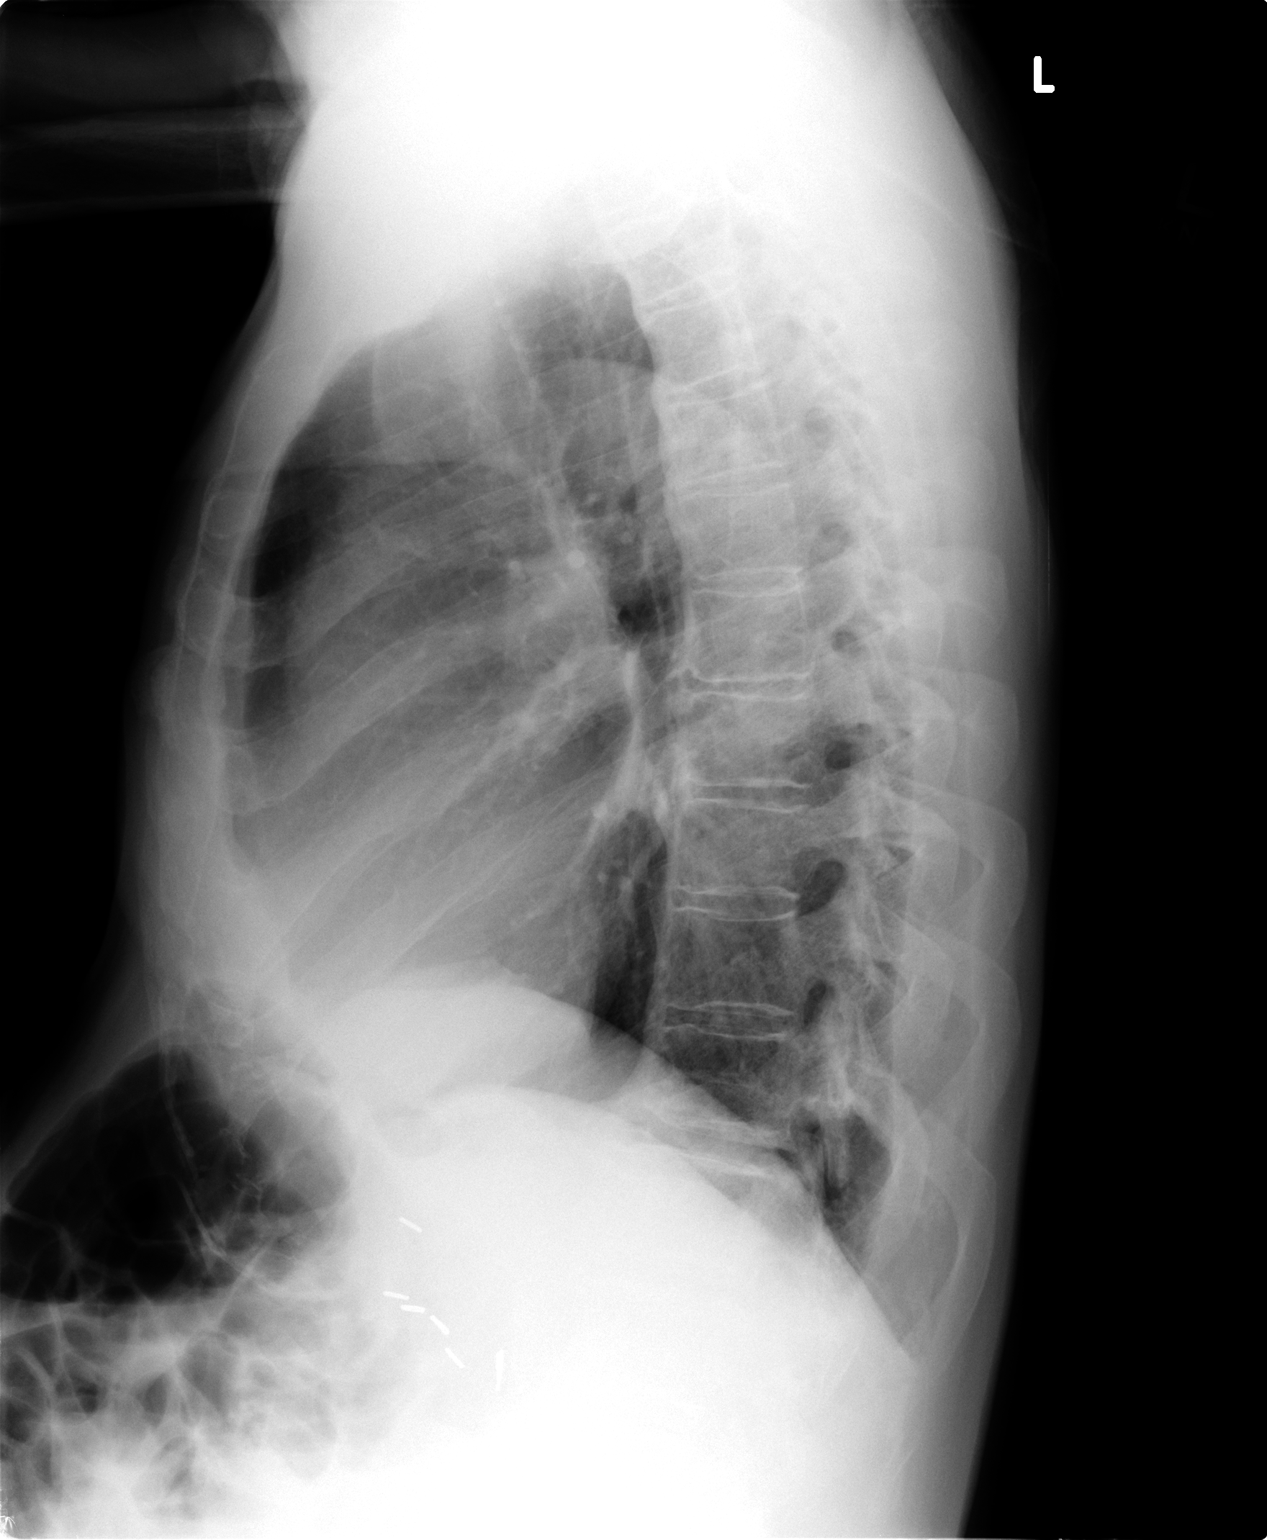

[2 of 2 positions shown; findings below may reference images not displayed]

FINDINGS: Stable normal sized heart and clear lungs.  Stable
anterior flowing ossification in the thoracic spine, compatible
with DISH.  Upper abdominal surgical clips.
IMPRESSION: No acute abnormality.

## 2011-12-17 ENCOUNTER — Other Ambulatory Visit: Payer: Self-pay | Admitting: Internal Medicine

## 2011-12-27 ENCOUNTER — Other Ambulatory Visit: Payer: Self-pay

## 2011-12-27 MED ORDER — METOPROLOL TARTRATE 50 MG PO TABS: 50.0000 mg | ORAL_TABLET | Freq: Two times a day (BID) | ORAL | Status: DC

## 2011-12-27 MED ORDER — AMLODIPINE BESYLATE 10 MG PO TABS: 10.0000 mg | ORAL_TABLET | Freq: Every day | ORAL | Status: DC

## 2011-12-27 MED ORDER — CLONIDINE HCL 0.2 MG PO TABS: 0.2000 mg | ORAL_TABLET | Freq: Two times a day (BID) | ORAL | Status: DC

## 2012-02-14 ENCOUNTER — Encounter: Payer: Self-pay | Admitting: Internal Medicine

## 2012-02-14 ENCOUNTER — Ambulatory Visit (INDEPENDENT_AMBULATORY_CARE_PROVIDER_SITE_OTHER): Payer: Medicare Other | Admitting: Internal Medicine

## 2012-02-14 ENCOUNTER — Other Ambulatory Visit (INDEPENDENT_AMBULATORY_CARE_PROVIDER_SITE_OTHER): Payer: Medicare Other

## 2012-02-14 VITALS — BP 122/60 | HR 64 | Temp 97.2°F | Ht 65.0 in | Wt 150.0 lb

## 2012-02-14 DIAGNOSIS — Z0000 Encounter for general adult medical examination without abnormal findings: Secondary | ICD-10-CM

## 2012-02-14 DIAGNOSIS — E119 Type 2 diabetes mellitus without complications: Secondary | ICD-10-CM

## 2012-02-14 LAB — BASIC METABOLIC PANEL
BUN: 24 mg/dL — ABNORMAL HIGH (ref 6–23)
Chloride: 102 mEq/L (ref 96–112)
Creatinine, Ser: 1.8 mg/dL — ABNORMAL HIGH (ref 0.4–1.5)
Glucose, Bld: 104 mg/dL — ABNORMAL HIGH (ref 70–99)
Potassium: 4.4 mEq/L (ref 3.5–5.1)

## 2012-02-14 LAB — HEPATIC FUNCTION PANEL
Alkaline Phosphatase: 67 U/L (ref 39–117)
Bilirubin, Direct: 0.2 mg/dL (ref 0.0–0.3)
Total Bilirubin: 0.9 mg/dL (ref 0.3–1.2)

## 2012-02-14 LAB — CBC WITH DIFFERENTIAL/PLATELET
Basophils Absolute: 0 10*3/uL (ref 0.0–0.1)
Basophils Relative: 0.3 % (ref 0.0–3.0)
Eosinophils Absolute: 0.2 10*3/uL (ref 0.0–0.7)
HCT: 40.1 % (ref 39.0–52.0)
Hemoglobin: 13 g/dL (ref 13.0–17.0)
Lymphs Abs: 1.2 10*3/uL (ref 0.7–4.0)
MCHC: 32.3 g/dL (ref 30.0–36.0)
MCV: 94.5 fl (ref 78.0–100.0)
Monocytes Absolute: 0.6 10*3/uL (ref 0.1–1.0)
Neutro Abs: 3.2 10*3/uL (ref 1.4–7.7)
RBC: 4.25 Mil/uL (ref 4.22–5.81)
RDW: 12.8 % (ref 11.5–14.6)

## 2012-02-14 LAB — URINALYSIS, ROUTINE W REFLEX MICROSCOPIC
Bilirubin Urine: NEGATIVE
Hgb urine dipstick: NEGATIVE
Ketones, ur: NEGATIVE
Total Protein, Urine: 30
Urine Glucose: 100

## 2012-02-14 LAB — LIPID PANEL
Cholesterol: 140 mg/dL (ref 0–200)
LDL Cholesterol: 89 mg/dL (ref 0–99)
VLDL: 17 mg/dL (ref 0.0–40.0)

## 2012-02-14 NOTE — Assessment & Plan Note (Signed)

## 2012-02-14 NOTE — Patient Instructions (Addendum)
Continue all other medications as before Please have the pharmacy call with any refills you may need. You are otherwise up to date with prevention today Please go to LAB in the Basement for the blood and/or urine tests to be done today You will be contacted by phone if any changes need to be made immediately.  Otherwise, you will receive a letter about your results with an explanation. Please return in 6 mo with Lab testing done 3-5 days before

## 2012-02-15 ENCOUNTER — Encounter: Payer: Self-pay | Admitting: Internal Medicine

## 2012-02-15 LAB — MICROALBUMIN / CREATININE URINE RATIO
Creatinine,U: 220.6 mg/dL
Microalb Creat Ratio: 10.4 mg/g (ref 0.0–30.0)

## 2012-02-16 ENCOUNTER — Encounter: Payer: Self-pay | Admitting: Internal Medicine

## 2012-02-16 NOTE — Progress Notes (Signed)
Subjective:    Patient ID: [name], male    DOB: Jun 01, 1925, 78 y.o.   MRN: [id number]  HPI  Here for wellness and f/u;  Overall doing ok;  Pt denies CP, worsening SOB, DOE, wheezing, orthopnea, PND, worsening LE edema, palpitations, dizziness or syncope.  Pt denies neurological change such as new Headache, facial or extremity weakness.  Pt denies polydipsia, polyuria, or low sugar symptoms. Pt states overall good compliance with treatment and medications, good tolerability, and trying to follow lower cholesterol diet.  Pt denies worsening depressive symptoms, suicidal ideation or panic. No fever, wt loss, night sweats, loss of appetite, or other constitutional symptoms.  Pt states good ability with ADL's, low fall risk, home safety reviewed and adequate, no significant changes in hearing or vision, and occasionally active with exercise.  O/w doing remarkably well - no acute complaints Past Medical History  Diagnosis Date  . DIABETES MELLITUS, TYPE II 12/24/2007  . HYPERLIPIDEMIA 03/24/2007  . ANEMIA-NOS 06/02/2007  . HEARING LOSS, LEFT EAR 01/18/2010  . HYPERTENSION 03/24/2007  . PULMONARY NODULE 06/02/2007  . STRICTURE, ESOPHAGEAL 06/02/2007  . GERD 06/02/2007  . GASTROENTERITIS, ACUTE 05/30/2010  . CONSTIPATION 01/18/2010  . CHOLELITHIASIS 06/02/2007  . RENAL INSUFFICIENCY 06/02/2007  . OSTEOARTHRITIS, KNEES, BILATERAL 01/26/2008  . FEVER UNSPECIFIED 01/18/2010  . Abdominal pain, right upper quadrant 06/12/2007  . EPIGASTRIC TENDERNESS 01/18/2010  . COLON CANCER, HX OF 03/24/2007  . NEPHROLITHIASIS, HX OF 06/02/2007   Past Surgical History  Procedure Date  . Small intestine surgery     reports that he has quit smoking. He does not have any smokeless tobacco history on file. He reports that he does not drink alcohol or use illicit drugs. family history includes Hypertension in his other. No Known Allergies Current Outpatient Prescriptions on File Prior to Visit  Medication Sig Dispense Refill    . amLODipine (NORVASC) 10 MG tablet Take 1 tablet (10 mg total) by mouth daily.  30 tablet  8  . cloNIDine (CATAPRES) 0.2 MG tablet Take 1 tablet (0.2 mg total) by mouth 2 (two) times daily.  60 tablet  8  . metoprolol (LOPRESSOR) 50 MG tablet Take 1 tablet (50 mg total) by mouth 2 (two) times daily.  60 tablet  11  . omeprazole (PRILOSEC) 20 MG capsule TAKE ONE CAPSULE BY MOUTH EVERY DAY  90 capsule  2  . polyethylene glycol (MIRALAX) powder Take 17 g by mouth. Use as directed OTC once daily        Review of Systems Review of Systems  Constitutional: Negative for diaphoresis, activity change, appetite change and unexpected weight change.  HENT: Negative for hearing loss, ear pain, facial swelling, mouth sores and neck stiffness.   Eyes: Negative for pain, redness and visual disturbance.  Respiratory: Negative for shortness of breath and wheezing.   Cardiovascular: Negative for chest pain and palpitations.  Gastrointestinal: Negative for diarrhea, blood in stool, abdominal distention and rectal pain.  Genitourinary: Negative for hematuria, flank pain and decreased urine volume.  Musculoskeletal: Negative for myalgias and joint swelling.  Skin: Negative for color change and wound.  Neurological: Negative for syncope and numbness.  Hematological: Negative for adenopathy.  Psychiatric/Behavioral: Negative for hallucinations, self-injury, decreased concentration and agitation.     Objective:   Physical Exam BP 122/60  Pulse 64  Temp 97.2 F (36.2 C) (Oral)  Ht 5\' 5"  (1.651 m)  Wt 150 lb (68.04 kg)  BMI 24.96 kg/m2  SpO2 92% Physical Exam  VS noted  Constitutional: Pt is oriented to person, place, and time. Appears well-developed and well-nourished.  HENT:  Head: Normocephalic and atraumatic.  Right Ear: External ear normal.  Left Ear: External ear normal.  Nose: Nose normal.  Mouth/Throat: Oropharynx is clear and moist.  Eyes: Conjunctivae and EOM are normal. Pupils are equal,  round, and reactive to light.  Neck: Normal range of motion. Neck supple. No JVD present. No tracheal deviation present.  Cardiovascular: Normal rate, regular rhythm, normal heart sounds and intact distal pulses.   Pulmonary/Chest: Effort normal and breath sounds normal.  Abdominal: Soft. Bowel sounds are normal. There is no tenderness.  Musculoskeletal: Normal range of motion. Exhibits no edema.  Lymphadenopathy:  Has no cervical adenopathy.  Neurological: Pt is alert and oriented to person, place, and time. Pt has normal reflexes. No cranial nerve deficit. Motor/gait ok - walks with cane Skin: Skin is warm and dry. No rash noted.  Psychiatric:  Has  normal mood and affect. Behavior is normal.     Assessment & Plan:

## 2012-02-16 NOTE — Assessment & Plan Note (Signed)
stable overall by hx and exam, most recent data reviewed with pt, and pt to continue medical treatment as before Lab Results  Component Value Date   HGBA1C 7.2* 02/14/2012

## 2012-08-07 ENCOUNTER — Ambulatory Visit (INDEPENDENT_AMBULATORY_CARE_PROVIDER_SITE_OTHER): Payer: Medicare Other | Admitting: Internal Medicine

## 2012-08-07 ENCOUNTER — Encounter: Payer: Self-pay | Admitting: Internal Medicine

## 2012-08-07 ENCOUNTER — Other Ambulatory Visit (INDEPENDENT_AMBULATORY_CARE_PROVIDER_SITE_OTHER): Payer: Medicare Other

## 2012-08-07 VITALS — BP 140/78 | HR 74 | Temp 97.0°F | Ht 65.0 in | Wt 152.5 lb

## 2012-08-07 DIAGNOSIS — E119 Type 2 diabetes mellitus without complications: Secondary | ICD-10-CM

## 2012-08-07 DIAGNOSIS — J069 Acute upper respiratory infection, unspecified: Secondary | ICD-10-CM

## 2012-08-07 DIAGNOSIS — I10 Essential (primary) hypertension: Secondary | ICD-10-CM

## 2012-08-07 DIAGNOSIS — Z23 Encounter for immunization: Secondary | ICD-10-CM

## 2012-08-07 DIAGNOSIS — Z0000 Encounter for general adult medical examination without abnormal findings: Secondary | ICD-10-CM

## 2012-08-07 DIAGNOSIS — E785 Hyperlipidemia, unspecified: Secondary | ICD-10-CM

## 2012-08-07 LAB — BASIC METABOLIC PANEL
BUN: 27 mg/dL — ABNORMAL HIGH (ref 6–23)
CO2: 28 mEq/L (ref 19–32)
Calcium: 9.1 mg/dL (ref 8.4–10.5)
Chloride: 104 mEq/L (ref 96–112)
Creatinine, Ser: 1.7 mg/dL — ABNORMAL HIGH (ref 0.4–1.5)
Glucose, Bld: 126 mg/dL — ABNORMAL HIGH (ref 70–99)

## 2012-08-07 LAB — LIPID PANEL
Cholesterol: 130 mg/dL (ref 0–200)
HDL: 33.1 mg/dL — ABNORMAL LOW (ref >39.00)
Triglycerides: 99 mg/dL (ref 0.0–149.0)

## 2012-08-07 LAB — HEMOGLOBIN A1C: Hgb A1c MFr Bld: 6.9 % — ABNORMAL HIGH (ref 4.6–6.5)

## 2012-08-07 NOTE — Patient Instructions (Addendum)
You had the flu shot today Continue all other medications as before Please have the pharmacy call with any other refills you may need. You can also take Mucinex (or it's generic off brand) for any leftover congestion from the cold Please go to LAB in the Basement for the blood and/or urine tests to be done today You will be contacted by phone if any changes need to be made immediately.  Otherwise, you will receive a letter about your results with an explanation Please remember to sign up for My Chart at your earliest convenience, as this will be important to you in the future with finding out test results. Please return in 6 mo with Lab testing done 3-5 days before

## 2012-08-09 ENCOUNTER — Encounter: Payer: Self-pay | Admitting: Internal Medicine

## 2012-08-09 DIAGNOSIS — J069 Acute upper respiratory infection, unspecified: Secondary | ICD-10-CM | POA: Insufficient documentation

## 2012-08-09 NOTE — Assessment & Plan Note (Signed)
stable overall by hx and exam, most recent data reviewed with pt, and pt to continue medical treatment as before BP Readings from Last 3 Encounters:  08/07/12 140/78  02/14/12 122/60  08/16/11 144/80

## 2012-08-09 NOTE — Progress Notes (Signed)
Subjective:    Patient ID: [name], male    DOB: [birth year], 78 y.o.   MRN: [id number]  HPI  Here to f/u; overall doing ok,  Pt denies chest pain, increased sob or doe, wheezing, orthopnea, PND, increased LE swelling, palpitations, dizziness or syncope.  Pt denies new neurological symptoms such as new headache, or facial or extremity weakness or numbness   Pt denies polydipsia, polyuria, or low sugar symptoms such as weakness or confusion improved with po intake.  Pt states overall good compliance with meds, trying to follow lower cholesterol, diabetic diet, wt overall stable but little exercise however. Has had a mild URI with low grade temp and clearish congestion of the nasal, but improving in the last 2 days Past Medical History  Diagnosis Date  . DIABETES MELLITUS, TYPE II 12/24/2007  . HYPERLIPIDEMIA 03/24/2007  . ANEMIA-NOS 06/02/2007  . HEARING LOSS, LEFT EAR 01/18/2010  . HYPERTENSION 03/24/2007  . PULMONARY NODULE 06/02/2007  . STRICTURE, ESOPHAGEAL 06/02/2007  . GERD 06/02/2007  . GASTROENTERITIS, ACUTE 05/30/2010  . CONSTIPATION 01/18/2010  . CHOLELITHIASIS 06/02/2007  . RENAL INSUFFICIENCY 06/02/2007  . OSTEOARTHRITIS, KNEES, BILATERAL 01/26/2008  . FEVER UNSPECIFIED 01/18/2010  . Abdominal pain, right upper quadrant 06/12/2007  . EPIGASTRIC TENDERNESS 01/18/2010  . COLON CANCER, HX OF 03/24/2007  . NEPHROLITHIASIS, HX OF 06/02/2007   Past Surgical History  Procedure Date  . Small intestine surgery     reports that he has quit smoking. He does not have any smokeless tobacco history on file. He reports that he does not drink alcohol or use illicit drugs. family history includes Hypertension in his other. No Known Allergies Current Outpatient Prescriptions on File Prior to Visit  Medication Sig Dispense Refill  . amLODipine (NORVASC) 10 MG tablet Take 1 tablet (10 mg total) by mouth daily.  30 tablet  8  . cloNIDine (CATAPRES) 0.2 MG tablet Take 1 tablet (0.2 mg total) by mouth 2  (two) times daily.  60 tablet  8  . metoprolol (LOPRESSOR) 50 MG tablet Take 1 tablet (50 mg total) by mouth 2 (two) times daily.  60 tablet  11  . omeprazole (PRILOSEC) 20 MG capsule TAKE ONE CAPSULE BY MOUTH EVERY DAY  90 capsule  2  . polyethylene glycol (MIRALAX) powder Take 17 g by mouth. Use as directed OTC once daily        Review of Systems  Constitutional: Negative for diaphoresis and unexpected weight change.  HENT: Negative for tinnitus.   Eyes: Negative for photophobia and visual disturbance.  Respiratory: Negative for choking and stridor.   Gastrointestinal: Negative for vomiting and blood in stool.  Genitourinary: Negative for hematuria and decreased urine volume.  Musculoskeletal: Negative for gait problem.  Skin: Negative for color change and wound.  Neurological: Negative for tremors and numbness.  Psychiatric/Behavioral: Negative for decreased concentration. The patient is not hyperactive.       Objective:   Physical Exam BP 140/78  Pulse 74  Temp 97 F (36.1 C) (Oral)  Ht 5\' 5"  (1.651 m)  Wt 152 lb 8 oz (69.174 kg)  BMI 25.38 kg/m2  SpO2 99% Physical Exam  VS noted Constitutional: Pt appears well-developed and well-nourished.  HENT: Head: Normocephalic.  Right Ear: External ear normal.  Left Ear: External ear normal.  Bilat tm's mild erythema.  Sinus nontender.  Pharynx mild erythema Eyes: Conjunctivae and EOM are normal. Pupils are equal, round, and reactive to light.  Neck: Normal range of motion. Neck supple.  Cardiovascular: Normal rate and regular rhythm.   Pulmonary/Chest: Effort normal and breath sounds normal.  Abd:  Soft, NT, non-distended, + BS Neurological: Pt is alert. Skin: Skin is warm. No erythema.  Psychiatric: Pt behavior is normal.    Assessment & Plan:

## 2012-08-09 NOTE — Assessment & Plan Note (Signed)
Mild, resolving, for mucinex otc prn

## 2012-08-09 NOTE — Assessment & Plan Note (Signed)
stable overall by hx and exam, most recent data reviewed with pt, and pt to continue medical treatment as before Lab Results  Component Value Date   LDLCALC 77 08/07/2012

## 2012-08-09 NOTE — Assessment & Plan Note (Signed)
stable overall by hx and exam, most recent data reviewed with pt, and pt to continue medical treatment as before Lab Results  Component Value Date   HGBA1C 6.9* 08/07/2012

## 2012-09-15 ENCOUNTER — Other Ambulatory Visit: Payer: Self-pay | Admitting: Internal Medicine

## 2012-09-17 ENCOUNTER — Other Ambulatory Visit: Payer: Self-pay | Admitting: Internal Medicine

## 2012-12-06 ENCOUNTER — Encounter (HOSPITAL_COMMUNITY): Payer: Self-pay | Admitting: *Deleted

## 2012-12-06 ENCOUNTER — Emergency Department (HOSPITAL_COMMUNITY)
Admission: EM | Admit: 2012-12-06 | Discharge: 2012-12-06 | Disposition: A | Discharge status: Home / Self Care | Payer: Medicare Other | Attending: Emergency Medicine | Admitting: Emergency Medicine

## 2012-12-06 DIAGNOSIS — Z8719 Personal history of other diseases of the digestive system: Secondary | ICD-10-CM | POA: Insufficient documentation

## 2012-12-06 DIAGNOSIS — I10 Essential (primary) hypertension: Secondary | ICD-10-CM | POA: Insufficient documentation

## 2012-12-06 DIAGNOSIS — Z87448 Personal history of other diseases of urinary system: Secondary | ICD-10-CM | POA: Insufficient documentation

## 2012-12-06 DIAGNOSIS — K219 Gastro-esophageal reflux disease without esophagitis: Secondary | ICD-10-CM | POA: Insufficient documentation

## 2012-12-06 DIAGNOSIS — Z862 Personal history of diseases of the blood and blood-forming organs and certain disorders involving the immune mechanism: Secondary | ICD-10-CM | POA: Insufficient documentation

## 2012-12-06 DIAGNOSIS — H6120 Impacted cerumen, unspecified ear: Secondary | ICD-10-CM | POA: Insufficient documentation

## 2012-12-06 DIAGNOSIS — Z85038 Personal history of other malignant neoplasm of large intestine: Secondary | ICD-10-CM | POA: Insufficient documentation

## 2012-12-06 DIAGNOSIS — Z87891 Personal history of nicotine dependence: Secondary | ICD-10-CM | POA: Insufficient documentation

## 2012-12-06 DIAGNOSIS — Z79899 Other long term (current) drug therapy: Secondary | ICD-10-CM | POA: Insufficient documentation

## 2012-12-06 DIAGNOSIS — H6122 Impacted cerumen, left ear: Secondary | ICD-10-CM

## 2012-12-06 DIAGNOSIS — E119 Type 2 diabetes mellitus without complications: Secondary | ICD-10-CM | POA: Insufficient documentation

## 2012-12-06 DIAGNOSIS — Z8709 Personal history of other diseases of the respiratory system: Secondary | ICD-10-CM | POA: Insufficient documentation

## 2012-12-06 DIAGNOSIS — E785 Hyperlipidemia, unspecified: Secondary | ICD-10-CM | POA: Insufficient documentation

## 2012-12-06 DIAGNOSIS — M1710 Unilateral primary osteoarthritis, unspecified knee: Secondary | ICD-10-CM | POA: Insufficient documentation

## 2012-12-06 DIAGNOSIS — Z8619 Personal history of other infectious and parasitic diseases: Secondary | ICD-10-CM | POA: Insufficient documentation

## 2012-12-06 DIAGNOSIS — IMO0002 Reserved for concepts with insufficient information to code with codable children: Secondary | ICD-10-CM | POA: Insufficient documentation

## 2012-12-06 DIAGNOSIS — Z87442 Personal history of urinary calculi: Secondary | ICD-10-CM | POA: Insufficient documentation

## 2012-12-06 MED ORDER — DOCUSATE SODIUM 50 MG/5ML PO LIQD
25.0000 mg | Freq: Once | ORAL | Status: AC
  Administered 2012-12-06: 25 mg
  Filled 2012-12-06: qty 10

## 2012-12-06 NOTE — ED Notes (Signed)
Unable to dislodge wax after 2nd flush.  Dr Hyacinth Meeker aware and will remove with currette.

## 2012-12-06 NOTE — ED Notes (Signed)
BP wnl.   

## 2012-12-06 NOTE — ED Notes (Signed)
Pt reports not being able to hear out of his left ear since yesterday, denies having any ear pain. bp 204/97 at triage, has not taken his bp meds today.

## 2012-12-06 NOTE — ED Provider Notes (Signed)
History     CSN: [id number]  Arrival date & time 12/06/12  1101   First MD Initiated Contact with Patient 12/06/12 1147      Chief Complaint  Patient presents with  . Hearing Problem    (Consider location/radiation/quality/duration/timing/severity/associated sxs/prior treatment) HPI Comments: Pt is 79 y/o male with hx of hearing loss in the left ear for the last several years who presents with decrease in the hearing of the L ear which was gradual over the last 48 hours.  It has been persistent, not associated with changes in vision, speech, strength, sensation or any other c/o.  His appetite has been normal, no n/v, no diarrhea, no abd pain, CP, cough, sob or any other c/o.    The history is provided by the patient and the spouse.    Past Medical History  Diagnosis Date  . DIABETES MELLITUS, TYPE II 12/24/2007  . HYPERLIPIDEMIA 03/24/2007  . ANEMIA-NOS 06/02/2007  . HEARING LOSS, LEFT EAR 01/18/2010  . HYPERTENSION 03/24/2007  . PULMONARY NODULE 06/02/2007  . STRICTURE, ESOPHAGEAL 06/02/2007  . GERD 06/02/2007  . GASTROENTERITIS, ACUTE 05/30/2010  . CONSTIPATION 01/18/2010  . CHOLELITHIASIS 06/02/2007  . RENAL INSUFFICIENCY 06/02/2007  . OSTEOARTHRITIS, KNEES, BILATERAL 01/26/2008  . FEVER UNSPECIFIED 01/18/2010  . Abdominal pain, right upper quadrant 06/12/2007  . EPIGASTRIC TENDERNESS 01/18/2010  . COLON CANCER, HX OF 03/24/2007  . NEPHROLITHIASIS, HX OF 06/02/2007    Past Surgical History  Procedure Laterality Date  . Small intestine surgery      Family History  Problem Relation Age of Onset  . Hypertension Other     History  Substance Use Topics  . Smoking status: Former Games developer  . Smokeless tobacco: Not on file  . Alcohol Use: No      Review of Systems  All other systems reviewed and are negative.    Allergies  Review of patient's allergies indicates no known allergies.  Home Medications   Current Outpatient Rx  Name  Route  Sig  Dispense  Refill  .  amLODipine (NORVASC) 10 MG tablet   Oral   Take 1 tablet (10 mg total) by mouth daily.   30 tablet   8   . cloNIDine (CATAPRES) 0.2 MG tablet   Oral   Take 0.2 mg by mouth 2 (two) times daily.         . metoprolol (LOPRESSOR) 50 MG tablet   Oral   Take 1 tablet (50 mg total) by mouth 2 (two) times daily.   60 tablet   11   . omeprazole (PRILOSEC) 20 MG capsule   Oral   Take 20 mg by mouth daily.           BP 204/97  Pulse 97  Temp(Src) 98.6 F (37 C) (Oral)  Resp 18  SpO2 98%  Physical Exam  Nursing note and vitals reviewed. Constitutional: He appears well-developed and well-nourished. No distress.  HENT:  Head: Normocephalic and atraumatic.  Right Ear: Tympanic membrane and external ear normal. No decreased hearing is noted.  Left Ear: External ear normal. No drainage, swelling or tenderness. Decreased hearing is noted.  Nose: Nose normal.  Mouth/Throat: Uvula is midline, oropharynx is clear and moist and mucous membranes are normal. No oropharyngeal exudate.  Cerumen impaction present to the L ear  Eyes: Conjunctivae and EOM are normal. Pupils are equal, round, and reactive to light. Right eye exhibits no discharge. Left eye exhibits no discharge. No scleral icterus.  Neck: Normal range of  motion. Neck supple. No JVD present. No thyromegaly present.  Cardiovascular: Normal rate, regular rhythm, normal heart sounds and intact distal pulses.  Exam reveals no gallop and no friction rub.   No murmur heard. Pulmonary/Chest: Effort normal and breath sounds normal. No respiratory distress. He has no wheezes. He has no rales.  Abdominal: Soft. Bowel sounds are normal. He exhibits no distension and no mass. There is no tenderness.  Musculoskeletal: Normal range of motion. He exhibits no edema and no tenderness.  Lymphadenopathy:    He has no cervical adenopathy.  Neurological: He is alert. Coordination normal.  Skin: Skin is warm and dry. No rash noted. No erythema.   Psychiatric: He has a normal mood and affect. His behavior is normal.    ED Course  Procedures (including critical care time)  Labs Reviewed - No data to display No results found.   1. Cerumen impaction, left       MDM  Overall the pt appears benign, doubt neurological process or infetions process given cerumen impaction presence.  He has htn, will have take his meds for BP - no other sx of hypertension to suggest end organ dysfunction.  The patient has been given his home blood pressure medications, is cerumen impaction has been removed manually with a curette by myself. He has restored his baseline hearing and he appears well and ready for discharge.  Meds given in ED:  Medications  docusate (COLACE) 50 MG/5ML liquid 25 mg (25 mg Per Tube Given 12/06/12 1212)    New Prescriptions   No medications on file        Vida Roller, MD 12/06/12 1443

## 2012-12-06 NOTE — ED Notes (Addendum)
Attempted wax removal with warm H2O.  Some loosening of wax, but minimal.  Pt with colace soak again and will re-attempt.  Pt has also taken his am bp medications.

## 2012-12-07 ENCOUNTER — Emergency Department (HOSPITAL_COMMUNITY): Payer: Medicare Other

## 2012-12-07 ENCOUNTER — Encounter (HOSPITAL_COMMUNITY): Payer: Self-pay | Admitting: Emergency Medicine

## 2012-12-07 ENCOUNTER — Inpatient Hospital Stay (HOSPITAL_COMMUNITY)
Admission: EM | Admit: 2012-12-07 | Discharge: 2012-12-09 | DRG: 641 | Disposition: A | Discharge status: Home / Self Care | Payer: Medicare Other | Attending: Internal Medicine | Admitting: Internal Medicine

## 2012-12-07 DIAGNOSIS — N183 Chronic kidney disease, stage 3 unspecified: Secondary | ICD-10-CM | POA: Diagnosis present

## 2012-12-07 DIAGNOSIS — D649 Anemia, unspecified: Secondary | ICD-10-CM

## 2012-12-07 DIAGNOSIS — K222 Esophageal obstruction: Secondary | ICD-10-CM

## 2012-12-07 DIAGNOSIS — N259 Disorder resulting from impaired renal tubular function, unspecified: Secondary | ICD-10-CM

## 2012-12-07 DIAGNOSIS — I10 Essential (primary) hypertension: Secondary | ICD-10-CM

## 2012-12-07 DIAGNOSIS — E785 Hyperlipidemia, unspecified: Secondary | ICD-10-CM

## 2012-12-07 DIAGNOSIS — E860 Dehydration: Principal | ICD-10-CM

## 2012-12-07 DIAGNOSIS — Z79899 Other long term (current) drug therapy: Secondary | ICD-10-CM

## 2012-12-07 DIAGNOSIS — IMO0002 Reserved for concepts with insufficient information to code with codable children: Secondary | ICD-10-CM

## 2012-12-07 DIAGNOSIS — I129 Hypertensive chronic kidney disease with stage 1 through stage 4 chronic kidney disease, or unspecified chronic kidney disease: Secondary | ICD-10-CM | POA: Diagnosis present

## 2012-12-07 DIAGNOSIS — N189 Chronic kidney disease, unspecified: Secondary | ICD-10-CM

## 2012-12-07 DIAGNOSIS — Z85038 Personal history of other malignant neoplasm of large intestine: Secondary | ICD-10-CM

## 2012-12-07 DIAGNOSIS — Z87442 Personal history of urinary calculi: Secondary | ICD-10-CM

## 2012-12-07 DIAGNOSIS — R1310 Dysphagia, unspecified: Secondary | ICD-10-CM

## 2012-12-07 DIAGNOSIS — N390 Urinary tract infection, site not specified: Secondary | ICD-10-CM | POA: Diagnosis present

## 2012-12-07 DIAGNOSIS — K5900 Constipation, unspecified: Secondary | ICD-10-CM

## 2012-12-07 DIAGNOSIS — E119 Type 2 diabetes mellitus without complications: Secondary | ICD-10-CM

## 2012-12-07 DIAGNOSIS — R509 Fever, unspecified: Secondary | ICD-10-CM

## 2012-12-07 DIAGNOSIS — K8020 Calculus of gallbladder without cholecystitis without obstruction: Secondary | ICD-10-CM

## 2012-12-07 DIAGNOSIS — K219 Gastro-esophageal reflux disease without esophagitis: Secondary | ICD-10-CM

## 2012-12-07 DIAGNOSIS — M1710 Unilateral primary osteoarthritis, unspecified knee: Secondary | ICD-10-CM | POA: Diagnosis present

## 2012-12-07 DIAGNOSIS — Z0000 Encounter for general adult medical examination without abnormal findings: Secondary | ICD-10-CM

## 2012-12-07 DIAGNOSIS — J069 Acute upper respiratory infection, unspecified: Secondary | ICD-10-CM

## 2012-12-07 DIAGNOSIS — R51 Headache: Secondary | ICD-10-CM | POA: Diagnosis present

## 2012-12-07 DIAGNOSIS — H9190 Unspecified hearing loss, unspecified ear: Secondary | ICD-10-CM

## 2012-12-07 DIAGNOSIS — J984 Other disorders of lung: Secondary | ICD-10-CM

## 2012-12-07 DIAGNOSIS — D72829 Elevated white blood cell count, unspecified: Secondary | ICD-10-CM

## 2012-12-07 LAB — CBC WITH DIFFERENTIAL/PLATELET
Basophils Absolute: 0 10*3/uL (ref 0.0–0.1)
Eosinophils Relative: 0 % (ref 0–5)
HCT: 36.3 % — ABNORMAL LOW (ref 39.0–52.0)
Lymphocytes Relative: 6 % — ABNORMAL LOW (ref 12–46)
Lymphs Abs: 0.9 10*3/uL (ref 0.7–4.0)
MCV: 88.3 fL (ref 78.0–100.0)
Monocytes Absolute: 1.9 10*3/uL — ABNORMAL HIGH (ref 0.1–1.0)
Neutro Abs: 13.4 10*3/uL — ABNORMAL HIGH (ref 1.7–7.7)
Platelets: 197 10*3/uL (ref 150–400)
RBC: 4.11 MIL/uL — ABNORMAL LOW (ref 4.22–5.81)
RDW: 12.2 % (ref 11.5–15.5)
WBC: 16.2 10*3/uL — ABNORMAL HIGH (ref 4.0–10.5)

## 2012-12-07 LAB — POCT I-STAT, CHEM 8
BUN: 21 mg/dL (ref 6–23)
Chloride: 98 mEq/L (ref 96–112)
Creatinine, Ser: 1.9 mg/dL — ABNORMAL HIGH (ref 0.50–1.35)
Sodium: 136 mEq/L (ref 135–145)
TCO2: 27 mmol/L (ref 0–100)

## 2012-12-07 LAB — URINALYSIS, ROUTINE W REFLEX MICROSCOPIC
Glucose, UA: 250 mg/dL — AB
Ketones, ur: NEGATIVE mg/dL
Nitrite: NEGATIVE
Protein, ur: 100 mg/dL — AB
Specific Gravity, Urine: 1.02
Urobilinogen, UA: 2 mg/dL — ABNORMAL HIGH
pH: 6.5

## 2012-12-07 LAB — URINE MICROSCOPIC-ADD ON

## 2012-12-07 LAB — CG4 I-STAT (LACTIC ACID): Lactic Acid, Venous: 1.01 mmol/L (ref 0.5–2.2)

## 2012-12-07 MED ORDER — ACETAMINOPHEN 650 MG RE SUPP
650.0000 mg | Freq: Once | RECTAL | Status: AC
  Administered 2012-12-07: 650 mg via RECTAL
  Filled 2012-12-07: qty 1

## 2012-12-07 MED ORDER — SODIUM CHLORIDE 0.9 % IV BOLUS (SEPSIS)
1000.0000 mL | Freq: Once | INTRAVENOUS | Status: AC
  Administered 2012-12-08: 1000 mL via INTRAVENOUS

## 2012-12-07 MED ORDER — SODIUM CHLORIDE 0.9 % IV BOLUS (SEPSIS)
1000.0000 mL | Freq: Once | INTRAVENOUS | Status: AC
  Administered 2012-12-07: 1000 mL via INTRAVENOUS

## 2012-12-07 NOTE — ED Notes (Signed)
PT. REPORTS HEADACHE AND HYPERTENSION FOR SEVERAL DAYS WITH SLIGHT NAUSEA / LIGHTHEADED.

## 2012-12-07 NOTE — ED Provider Notes (Signed)
Complains of sore throat with pain on swallowing and cough for the past 2 days. Also complains of frontal headache. On exam alert nontoxic HEENT exam no facial asymmetry oropharynx is normal appearing. Patient is edentulous neck is supple with no signs of meningitis lungs clear auscultation  Doug Sou, MD 12/08/12 [phone]

## 2012-12-07 NOTE — ED Provider Notes (Signed)
History     CSN: [id number]  Arrival date & time 12/07/12  2008   First MD Initiated Contact with Patient 12/07/12 2020      Chief Complaint  Patient presents with  . Headache  . Hypertension    (Consider location/radiation/quality/duration/timing/severity/associated sxs/prior treatment) Patient is a 79 y.o. male presenting with headaches and general illness. The history is provided by the patient, the spouse and medical records.  Headache Pain location:  Generalized Quality:  Dull Radiates to:  Does not radiate Onset quality:  Gradual Duration:  2 days Timing:  Constant Progression:  Waxing and waning Chronicity:  New Similar to prior headaches: no   Exacerbated by: attempts to swallow. Associated symptoms: cough and fever   Associated symptoms: no abdominal pain, no diarrhea, no nausea, no neck pain, no numbness and no vomiting   Illness  The current episode started 2 days ago (Pt states he "cannot get anything down" and is concerned heis dehydrated because he has not been able to swallow for two days). The onset was gradual. The problem occurs continuously. The problem has been unchanged. The problem is moderate. Associated symptoms include a fever, headaches and cough. Pertinent negatives include no abdominal pain, no constipation, no diarrhea, no nausea, no vomiting, no neck pain, no wheezing and no rash.    Past Medical History  Diagnosis Date  . DIABETES MELLITUS, TYPE II 12/24/2007  . HYPERLIPIDEMIA 03/24/2007  . ANEMIA-NOS 06/02/2007  . HEARING LOSS, LEFT EAR 01/18/2010  . HYPERTENSION 03/24/2007  . PULMONARY NODULE 06/02/2007  . STRICTURE, ESOPHAGEAL 06/02/2007  . GERD 06/02/2007  . GASTROENTERITIS, ACUTE 05/30/2010  . CONSTIPATION 01/18/2010  . CHOLELITHIASIS 06/02/2007  . RENAL INSUFFICIENCY 06/02/2007  . OSTEOARTHRITIS, KNEES, BILATERAL 01/26/2008  . FEVER UNSPECIFIED 01/18/2010  . Abdominal pain, right upper quadrant 06/12/2007  . EPIGASTRIC TENDERNESS 01/18/2010  .  COLON CANCER, HX OF 03/24/2007  . NEPHROLITHIASIS, HX OF 06/02/2007    Past Surgical History  Procedure Laterality Date  . Small intestine surgery      Family History  Problem Relation Age of Onset  . Hypertension Other     History  Substance Use Topics  . Smoking status: Former Games developer  . Smokeless tobacco: Not on file  . Alcohol Use: No      Review of Systems  Constitutional: Positive for fever and chills. Negative for diaphoresis, activity change and appetite change.  HENT: Negative for neck pain.        Difficulty swallowing  Respiratory: Positive for cough. Negative for choking, chest tightness, shortness of breath and wheezing.   Cardiovascular: Negative for chest pain, palpitations and leg swelling.  Gastrointestinal: Negative for nausea, vomiting, abdominal pain, diarrhea and constipation.  Skin: Negative for rash and wound.  Neurological: Positive for headaches. Negative for syncope, weakness, light-headedness and numbness.  All other systems reviewed and are negative.    Allergies  Review of patient's allergies indicates no known allergies.  Home Medications   Current Outpatient Rx  Name  Route  Sig  Dispense  Refill  . amLODipine (NORVASC) 10 MG tablet   Oral   Take 1 tablet (10 mg total) by mouth daily.   30 tablet   8   . cloNIDine (CATAPRES) 0.2 MG tablet   Oral   Take 0.2 mg by mouth 2 (two) times daily.         . metoprolol (LOPRESSOR) 50 MG tablet   Oral   Take 1 tablet (50 mg total) by mouth 2 (two)  times daily.   60 tablet   11   . omeprazole (PRILOSEC) 20 MG capsule   Oral   Take 20 mg by mouth daily.           BP 175/91  Temp(Src) 100.2 F (37.9 C) (Oral)  Resp 18  SpO2 100%  Physical Exam  Nursing note and vitals reviewed. Constitutional: He appears well-developed and well-nourished.  HENT:  Head: Normocephalic and atraumatic.  Right Ear: External ear normal.  Left Ear: External ear normal.  Nose: Nose normal.   Mouth/Throat: Oropharynx is clear and moist. No oropharyngeal exudate.  Eyes: Conjunctivae are normal. Pupils are equal, round, and reactive to light.  Neck: Normal range of motion. Neck supple.  Cardiovascular: Regular rhythm, normal heart sounds and intact distal pulses.   Tachycardic to the 100's  Pulmonary/Chest: Effort normal and breath sounds normal. No respiratory distress. He has no wheezes. He has no rales. He exhibits no tenderness.  Abdominal: Soft. Bowel sounds are normal. He exhibits no distension and no mass. There is no tenderness. There is no rebound and no guarding.  Musculoskeletal: Normal range of motion. He exhibits no edema and no tenderness.  Neurological: He is alert. He displays normal reflexes. No cranial nerve deficit. He exhibits normal muscle tone. Coordination normal.  Skin: Skin is warm and dry. No rash noted. No erythema. No pallor.  Psychiatric: He has a normal mood and affect. His behavior is normal. Judgment and thought content normal.    ED Course  Procedures (including critical care time)  Labs Reviewed  URINALYSIS, ROUTINE W REFLEX MICROSCOPIC - Abnormal; Notable for the following:    APPearance CLOUDY (*)    Glucose, UA 250 (*)    Hgb urine dipstick MODERATE (*)    Bilirubin Urine SMALL (*)    Protein, ur 100 (*)    Urobilinogen, UA 2.0 (*)    Leukocytes, UA SMALL (*)    All other components within normal limits  CBC WITH DIFFERENTIAL - Abnormal; Notable for the following:    WBC 16.2 (*)    RBC 4.11 (*)    Hemoglobin 12.4 (*)    HCT 36.3 (*)    Neutrophils Relative 83 (*)    Neutro Abs 13.4 (*)    Lymphocytes Relative 6 (*)    Monocytes Absolute 1.9 (*)    All other components within normal limits  POCT I-STAT, CHEM 8 - Abnormal; Notable for the following:    Creatinine, Ser 1.90 (*)    Glucose, Bld 141 (*)    All other components within normal limits  URINE CULTURE  CULTURE, BLOOD (ROUTINE X 2)  CULTURE, BLOOD (ROUTINE X 2)  RAPID  STREP SCREEN  URINE MICROSCOPIC-ADD ON  CG4 I-STAT (LACTIC ACID)   Ct Head Wo Contrast  12/07/2012  *RADIOLOGY REPORT*  Clinical Data: Headache and hypertension for several days with lightheadedness.  CT HEAD WITHOUT CONTRAST  Technique:  Contiguous axial images were obtained from the base of the skull through the vertex without contrast.  Comparison: None  Findings: Bone windows demonstrate clear paranasal sinuses and mastoid air cells.  Soft tissue windows demonstrate expected cerebral atrophy.  Mild to moderate low density in the periventricular white matter likely related to small vessel disease. No  mass lesion, hemorrhage, hydrocephalus, acute infarct, intra-axial, or extra-axial fluid collection.  IMPRESSION:  1. No acute intracranial abnormality. 2. Cerebral atrophy and small vessel ischemic change.   Original Report Authenticated By: Jeronimo Greaves, M.D.    Ct Soft  Tissue Neck Wo Contrast  12/07/2012  *RADIOLOGY REPORT*  Clinical Data: "Abscess." Elevated white blood cell count. Dysphagia.  CT NECK WITHOUT CONTRAST  Technique:  Multidetector CT imaging of the neck was performed without intravenous contrast.  Comparison: None.  Findings: Limited intracranial imaging is within normal limits. Normal imaged appearance of the orbits and globes.  Normal nasopharynx, oropharynx.  Mild motion degradation at the level of the glottis.  Grossly normal larynx and hypopharynx.  Lung apices demonstrate only mild centrilobular emphysema.  Symmetric thyroid gland, submandibular glands, and parotid glands. No cervical adenopathy.  No subcutaneous edema.  Atherosclerosis of the aorta level of the carotid artery bifurcations bilaterally.  Marked cervical spondylosis.  Probable cerumen within the left external ear canal. Clear mastoid air cells.  Prominent flowing anterior osteophytes as can be seen with diffuse idiopathic skeletal hyperostosis.  IMPRESSION:  1.  Mild degradation secondary to lack of IV contrast and  minimal motion. 2.  Given this factor, no explanation for elevated white blood cell count.  No evidence of fluid collection to suggest abscess. 3.  Prominent anterior osteophytes throughout the cervical spine, suggesting diffuse idiopathic skeletal hyperostosis.  These could possibly cause dysphagia, given severity.   Original Report Authenticated By: Jeronimo Greaves, M.D.    Dg Chest Portable 1 View  12/07/2012  *RADIOLOGY REPORT*  Clinical Data: Headache, hypertension  PORTABLE CHEST - 1 VIEW  Comparison: 05/07/2010 CT  Findings: Heart size upper normal.  Mild aortic tortuosity.  Lungs predominately clear. No pneumothorax or pleural effusion.  No interval osseous change.  Surgical clips right upper quadrant.  IMPRESSION: No radiographic evidence of acute cardiopulmonary process.   Original Report Authenticated By: Jearld Lesch, M.D.      Date: 12/07/2012  Rate: 94 bpm  Rhythm: normal sinus rhythm  QRS Axis: normal  Intervals: normal  ST/T Wave abnormalities: nonspecific ST wave abnormalities  Conduction Disutrbances:first-degree A-V block   Narrative Interpretation: Normal sinus rhythm   Old EKG Reviewed: none available    1. Dehydration   2. Fever   3. Dysphagia       MDM  79 yo M presents for 2 days of light-headedness/weakness, fever/chills and dysphagia. State he has not "been able to get anything down." No vomiting or coughing. Abdominal exam benign. Pt febrile to 101F and tachycardic to the 100's on arrival; appears clinically dehydrated. IVF and antipyretics administered with improvement of tachycardia. CT of neck negative for evidence of abscess. CXR negative for evidence of PNA, U/A negative for evidence of UTI. Blood cultures and strep screen obtained as we do not have another source of fever. Hospitalist service consulted for admission.         Clemetine Marker, MD 12/08/12 [phone]

## 2012-12-08 ENCOUNTER — Observation Stay (HOSPITAL_COMMUNITY): Payer: Medicare Other

## 2012-12-08 DIAGNOSIS — R509 Fever, unspecified: Secondary | ICD-10-CM

## 2012-12-08 DIAGNOSIS — N189 Chronic kidney disease, unspecified: Secondary | ICD-10-CM

## 2012-12-08 DIAGNOSIS — E860 Dehydration: Secondary | ICD-10-CM

## 2012-12-08 DIAGNOSIS — R1310 Dysphagia, unspecified: Secondary | ICD-10-CM

## 2012-12-08 LAB — CBC
HCT: 35.8 % — ABNORMAL LOW (ref 39.0–52.0)
Hemoglobin: 12.2 g/dL — ABNORMAL LOW (ref 13.0–17.0)
MCH: 30.1 pg (ref 26.0–34.0)
MCHC: 34.1 g/dL (ref 30.0–36.0)
MCV: 88.4 fL (ref 78.0–100.0)

## 2012-12-08 LAB — RAPID STREP SCREEN (MED CTR MEBANE ONLY): Streptococcus, Group A Screen (Direct): NEGATIVE

## 2012-12-08 LAB — BASIC METABOLIC PANEL
BUN: 20 mg/dL (ref 6–23)
Chloride: 99 mEq/L (ref 96–112)
Glucose, Bld: 119 mg/dL — ABNORMAL HIGH (ref 70–99)
Potassium: 3.7 mEq/L (ref 3.5–5.1)

## 2012-12-08 MED ORDER — SODIUM CHLORIDE 0.9 % IV SOLN
INTRAVENOUS | Status: DC
  Administered 2012-12-08 (×2): via INTRAVENOUS

## 2012-12-08 MED ORDER — METOPROLOL TARTRATE 50 MG PO TABS
50.0000 mg | ORAL_TABLET | Freq: Two times a day (BID) | ORAL | Status: DC
  Administered 2012-12-08 – 2012-12-09 (×4): 50 mg via ORAL
  Filled 2012-12-08 (×6): qty 1

## 2012-12-08 MED ORDER — ACETAMINOPHEN 650 MG RE SUPP: 650.0000 mg | Freq: Four times a day (QID) | RECTAL | Status: DC | PRN

## 2012-12-08 MED ORDER — ACETAMINOPHEN 325 MG PO TABS
650.0000 mg | ORAL_TABLET | Freq: Four times a day (QID) | ORAL | Status: DC | PRN
  Administered 2012-12-08: 650 mg via ORAL
  Filled 2012-12-08: qty 2

## 2012-12-08 MED ORDER — SODIUM CHLORIDE 0.9 % IV SOLN
INTRAVENOUS | Status: DC
  Administered 2012-12-09: 05:00:00 via INTRAVENOUS

## 2012-12-08 MED ORDER — DOCUSATE SODIUM 100 MG PO CAPS
100.0000 mg | ORAL_CAPSULE | Freq: Every day | ORAL | Status: DC
  Administered 2012-12-09: 100 mg via ORAL
  Filled 2012-12-08 (×2): qty 1

## 2012-12-08 MED ORDER — SODIUM CHLORIDE 0.9 % IV SOLN
INTRAVENOUS | Status: AC
  Administered 2012-12-08: 02:00:00 via INTRAVENOUS

## 2012-12-08 MED ORDER — PANTOPRAZOLE SODIUM 40 MG IV SOLR
40.0000 mg | INTRAVENOUS | Status: DC
  Administered 2012-12-08: 40 mg via INTRAVENOUS
  Filled 2012-12-08 (×2): qty 40

## 2012-12-08 MED ORDER — DEXTROSE 5 % IV SOLN
1.0000 g | INTRAVENOUS | Status: DC
  Administered 2012-12-08 – 2012-12-09 (×2): 1 g via INTRAVENOUS
  Filled 2012-12-08 (×2): qty 10

## 2012-12-08 MED ORDER — ENOXAPARIN SODIUM 30 MG/0.3ML ~~LOC~~ SOLN
30.0000 mg | Freq: Every day | SUBCUTANEOUS | Status: DC
  Administered 2012-12-08 – 2012-12-09 (×2): 30 mg via SUBCUTANEOUS
  Filled 2012-12-08 (×2): qty 0.3

## 2012-12-08 MED ORDER — CLONIDINE HCL 0.2 MG PO TABS
0.2000 mg | ORAL_TABLET | Freq: Two times a day (BID) | ORAL | Status: DC
  Administered 2012-12-08 – 2012-12-09 (×4): 0.2 mg via ORAL
  Filled 2012-12-08 (×5): qty 1

## 2012-12-08 MED ORDER — AMLODIPINE BESYLATE 10 MG PO TABS
10.0000 mg | ORAL_TABLET | Freq: Every day | ORAL | Status: DC
  Administered 2012-12-08 – 2012-12-09 (×2): 10 mg via ORAL
  Filled 2012-12-08 (×2): qty 1

## 2012-12-08 NOTE — ED Provider Notes (Signed)
I have personally seen and examined the patient.  I have discussed the plan of care with the resident.  I have reviewed the documentation on PMH/FH/Soc. History.  I have reviewed the documentation of the resident and agree.  Doug Sou, MD 12/08/12 [phone]

## 2012-12-08 NOTE — Progress Notes (Addendum)
[facility] PROGRESS NOTE  Mendell Bontempo WUJ:[id number] DOB: Sep 24, 1924 DOA: 12/07/2012 PCP: Oliver Barre, MD   Patient is a 79 year old AA who presents to ED with difficulty swallowing, fever, and elevated WBC. Patient has a history of esophageal strictures in the past  Assessment/Plan:  UTI - U/A appears border line for UTI.  Will follow cultures - ceftriaxone 1 g intravenously once daily - chronic renal failure: creatinine at baseline (1.8 - 2.0)   Fever with Leukocytosis - Likely due to UTI - Urine and blood cultures pending -Tmax 101 -Hx of fever of unknown etiology in 2011  Dysphagia -Food "getting stuck and coming back up" after eating -Hx of esophageal stricture in 2008.  Balloon dilation of Schatzki's ring to 18 mm - barium swallow formal results pending  GERD -Protonix 40 mg IV -omeprazole 20 MG PO outpatient (needs to be increased to 40 mg at d/c)  Hypertension -Moderate control -amlodipine 10 Mg -Clonidine 50 mg -metoprolol tablet 50 MG   Chronic Stage 3 Renal Failure -Creatinine at baseline -Will monitor BP and consider adding Ace-I  Code Status: full Family Communication: no family present in the room Disposition Plan: inpatient   Consultants: none  Procedures: Barium swallow (12/08/12)  Antibiotics: Cetriaxone  (12/08/12)  HPI/Subjective: He received 20mg  omeprazole last night and states today he is feeling "pretty good" without difficulty getting food down.  Patient able to drink fluids and eat some breakfast without difficulty or regurgitation. Patient complains of 4-5/10 band-like, dull headache since early this morning. Patient has had several bowel movements and peeing without difficulty.  Objective: Filed Vitals:   12/08/12 0100 12/08/12 0140 12/08/12 0500 12/08/12 1113  BP: 134/112 165/92 173/89 176/83  Pulse: 76 90 79 85  Temp:  98.9 F (37.2 C) 98.8 F (37.1 C)   TempSrc:  Oral Oral   Resp:  18 16   Height:  5\' 5"  (1.651 m)     Weight:  65.4 kg (144 lb 2.9 oz)    SpO2: 99% 95% 94%     Intake/Output Summary (Last 24 hours) at 12/08/12 1238 Last data filed at 12/08/12 9562  Gross per 24 hour  Intake   1610 ml  Output      0 ml  Net   1610 ml   Filed Weights   12/08/12 0140  Weight: 65.4 kg (144 lb 2.9 oz)    Exam:   General:  He appears well-developed and well-nourished. Skin of extremities is flaky. Soft bulge above medial right clavicle.   HEENT normocephalic, atraumatic. External ears are normal. Oropharynx is clear and moist. No exudate on tonsils noted. Conjunctive are normal. PERRL.  Cardiovascular: Regular rhythm, normal heart sounds. Tachycardic.  Respiratory: Clear to auscultation. No respiratory distress, no wheezes, rales.   Abdomen: Non-tender to palpation. BS are normal. Soft without rebound, guarding, distention.   Musculoskeletal: normal range of motion. No edema.   Data Reviewed: Basic Metabolic Panel:  Recent Labs Lab 12/07/12 2115 12/08/12 0535  NA 136 135  K 4.2 3.7  CL 98 99  CO2  --  25  GLUCOSE 141* 119*  BUN 21 20  CREATININE 1.90* 1.58*  CALCIUM  --  8.9   CBC:  Recent Labs Lab 12/07/12 2057 12/07/12 2115 12/08/12 0535  WBC 16.2*  --  16.2*  NEUTROABS 13.4*  --   --   HGB 12.4* 13.6 12.2*  HCT 36.3* 40.0 35.8*  MCV 88.3  --  88.4  PLT 197  --  201   CBG:  Recent Labs Lab 12/08/12 0149  GLUCAP 99    Recent Results (from the past 240 hour(s))  RAPID STREP SCREEN     Status: None   Collection Time    12/08/12 12:04 AM      Result Value Range Status   Streptococcus, Group A Screen (Direct) NEGATIVE  NEGATIVE Final   Comment:            DUE TO INADEQUATE SENSITIVITY OF EIA     RAPID TESTS FOR GROUP A STREP (GAS)     IT IS RECOMMENDED THAT ALL NEGATIVE     RESULTS BE FOLLOWED BY A     GROUP A STREP PROBE.     Studies: Ct Head Wo Contrast  12/07/2012  *RADIOLOGY REPORT*  Clinical Data: Headache and hypertension for several days with  lightheadedness.  CT HEAD WITHOUT CONTRAST  Technique:  Contiguous axial images were obtained from the base of the skull through the vertex without contrast.  Comparison: None  Findings: Bone windows demonstrate clear paranasal sinuses and mastoid air cells.  Soft tissue windows demonstrate expected cerebral atrophy.  Mild to moderate low density in the periventricular white matter likely related to small vessel disease. No  mass lesion, hemorrhage, hydrocephalus, acute infarct, intra-axial, or extra-axial fluid collection.  IMPRESSION:  1. No acute intracranial abnormality. 2. Cerebral atrophy and small vessel ischemic change.   Original Report Authenticated By: Jeronimo Greaves, M.D.    Ct Soft Tissue Neck Wo Contrast  12/07/2012  *RADIOLOGY REPORT*  Clinical Data: "Abscess." Elevated white blood cell count. Dysphagia.  CT NECK WITHOUT CONTRAST  Technique:  Multidetector CT imaging of the neck was performed without intravenous contrast.  Comparison: None.  Findings: Limited intracranial imaging is within normal limits. Normal imaged appearance of the orbits and globes.  Normal nasopharynx, oropharynx.  Mild motion degradation at the level of the glottis.  Grossly normal larynx and hypopharynx.  Lung apices demonstrate only mild centrilobular emphysema.  Symmetric thyroid gland, submandibular glands, and parotid glands. No cervical adenopathy.  No subcutaneous edema.  Atherosclerosis of the aorta level of the carotid artery bifurcations bilaterally.  Marked cervical spondylosis.  Probable cerumen within the left external ear canal. Clear mastoid air cells.  Prominent flowing anterior osteophytes as can be seen with diffuse idiopathic skeletal hyperostosis.  IMPRESSION:  1.  Mild degradation secondary to lack of IV contrast and minimal motion. 2.  Given this factor, no explanation for elevated white blood cell count.  No evidence of fluid collection to suggest abscess. 3.  Prominent anterior osteophytes throughout the  cervical spine, suggesting diffuse idiopathic skeletal hyperostosis.  These could possibly cause dysphagia, given severity.   Original Report Authenticated By: Jeronimo Greaves, M.D.    Dg Chest Portable 1 View  12/07/2012  *RADIOLOGY REPORT*  Clinical Data: Headache, hypertension  PORTABLE CHEST - 1 VIEW  Comparison: 05/07/2010 CT  Findings: Heart size upper normal.  Mild aortic tortuosity.  Lungs predominately clear. No pneumothorax or pleural effusion.  No interval osseous change.  Surgical clips right upper quadrant.  IMPRESSION: No radiographic evidence of acute cardiopulmonary process.   Original Report Authenticated By: Jearld Lesch, M.D.     Scheduled Meds: . sodium chloride   Intravenous STAT  . amLODipine  10 mg Oral Daily  . cefTRIAXone (ROCEPHIN)  IV  1 g Intravenous Q24H  . cloNIDine  0.2 mg Oral BID  . enoxaparin (LOVENOX) injection  30 mg Subcutaneous Daily  . metoprolol  50 mg Oral BID   Continuous Infusions: . sodium chloride 75 mL/hr at 12/08/12 1113    Principal Problem:   Dehydration, mild Active Problems:   HYPERTENSION   STRICTURE, ESOPHAGEAL   CKD (chronic kidney disease)   Fever chills   Leukocytosis    Dwan Bolt, PA-C [facility] Pager: [phone]  12/08/2012, 12:38 PM  LOS: 1 day      Addendum  Patient seen and examined, chart and data base reviewed.  I agree with the above assessment and discharge plan.  For full details please see Mrs. Algis Downs PA note.   Clint Lipps, MD [facility] Pager: [phone] 12/08/2012, 2:07 PM

## 2012-12-08 NOTE — H&P (Signed)
PCP:   Oliver Barre, MD   Chief Complaint:  fever  HPI: 79 yo male with fever for one day with some mild confusion that has resolved with tylenol and ivf in ED.  Pt reports no cp, no sob, no cough/n/v/d.  No abd pain.  No dysuria.  No rashes.  No flu like symptoms.  Fever over 101 for last 24 hours.  Also c/o dysphagia has h/o esoph stricture in past and is started to have same problem swallowing, ct neck was done to r/o absess which was neg.  No sore throat.  Asked to admit pt for fever, unclear etiology.  No le edema or swelling.  Review of Systems:  Positive and negative as per HPI otherwise all other systems are negative  Past Medical History: Past Medical History  Diagnosis Date  . DIABETES MELLITUS, TYPE II 12/24/2007  . HYPERLIPIDEMIA 03/24/2007  . ANEMIA-NOS 06/02/2007  . HEARING LOSS, LEFT EAR 01/18/2010  . HYPERTENSION 03/24/2007  . PULMONARY NODULE 06/02/2007  . STRICTURE, ESOPHAGEAL 06/02/2007  . GERD 06/02/2007  . GASTROENTERITIS, ACUTE 05/30/2010  . CONSTIPATION 01/18/2010  . CHOLELITHIASIS 06/02/2007  . RENAL INSUFFICIENCY 06/02/2007  . OSTEOARTHRITIS, KNEES, BILATERAL 01/26/2008  . FEVER UNSPECIFIED 01/18/2010  . Abdominal pain, right upper quadrant 06/12/2007  . EPIGASTRIC TENDERNESS 01/18/2010  . COLON CANCER, HX OF 03/24/2007  . NEPHROLITHIASIS, HX OF 06/02/2007   Past Surgical History  Procedure Laterality Date  . Small intestine surgery      Medications: Prior to Admission medications   Medication Sig Start Date End Date Taking? Authorizing Provider  amLODipine (NORVASC) 10 MG tablet Take 10 mg by mouth daily. 12/27/11  Yes Corwin Levins, MD  cloNIDine (CATAPRES) 0.2 MG tablet Take 0.2 mg by mouth 2 (two) times daily.   Yes Historical Provider, MD  metoprolol (LOPRESSOR) 50 MG tablet Take 50 mg by mouth 2 (two) times daily. 12/27/11 12/26/12 Yes Corwin Levins, MD  omeprazole (PRILOSEC) 20 MG capsule Take 20 mg by mouth daily.   Yes Historical Provider, MD    Allergies:  No  Known Allergies  Social History:  reports that he has quit smoking. He does not have any smokeless tobacco history on file. He reports that he does not drink alcohol or use illicit drugs.  Family History: Family History  Problem Relation Age of Onset  . Hypertension Other     Physical Exam: Filed Vitals:   12/07/12 2012 12/07/12 2100 12/07/12 2112 12/07/12 2300  BP: 175/91 173/70  122/108  Pulse:  91  85  Temp: 100.2 F (37.9 C)  101 F (38.3 C)   TempSrc: Oral  Rectal   Resp: 18   24  SpO2: 100% 96%  97%   General appearance: alert, cooperative and no distress Head: Normocephalic, without obvious abnormality, atraumatic Eyes: negative Nose: Nares normal. Septum midline. Mucosa normal. No drainage or sinus tenderness. Neck: no JVD and supple, symmetrical, trachea midline Lungs: clear to auscultation bilaterally Heart: regular rate and rhythm, S1, S2 normal, no murmur, click, rub or gallop Abdomen: soft, non-tender; bowel sounds normal; no masses,  no organomegaly Extremities: extremities normal, atraumatic, no cyanosis or edema Pulses: 2+ and symmetric Skin: Skin color, texture, turgor normal. No rashes or lesions Neurologic: Grossly normal    Labs on Admission:   Recent Labs  12/07/12 2115  NA 136  K 4.2  CL 98  GLUCOSE 141*  BUN 21  CREATININE 1.90*    Recent Labs  12/07/12 2057 12/07/12 2115  WBC 16.2*  --   NEUTROABS 13.4*  --   HGB 12.4* 13.6  HCT 36.3* 40.0  MCV 88.3  --   PLT 197  --    Radiological Exams on Admission: Ct Head Wo Contrast  12/07/2012  *RADIOLOGY REPORT*  Clinical Data: Headache and hypertension for several days with lightheadedness.  CT HEAD WITHOUT CONTRAST  Technique:  Contiguous axial images were obtained from the base of the skull through the vertex without contrast.  Comparison: None  Findings: Bone windows demonstrate clear paranasal sinuses and mastoid air cells.  Soft tissue windows demonstrate expected cerebral atrophy.   Mild to moderate low density in the periventricular white matter likely related to small vessel disease. No  mass lesion, hemorrhage, hydrocephalus, acute infarct, intra-axial, or extra-axial fluid collection.  IMPRESSION:  1. No acute intracranial abnormality. 2. Cerebral atrophy and small vessel ischemic change.   Original Report Authenticated By: Jeronimo Greaves, M.D.    Ct Soft Tissue Neck Wo Contrast  12/07/2012  *RADIOLOGY REPORT*  Clinical Data: "Abscess." Elevated white blood cell count. Dysphagia.  CT NECK WITHOUT CONTRAST  Technique:  Multidetector CT imaging of the neck was performed without intravenous contrast.  Comparison: None.  Findings: Limited intracranial imaging is within normal limits. Normal imaged appearance of the orbits and globes.  Normal nasopharynx, oropharynx.  Mild motion degradation at the level of the glottis.  Grossly normal larynx and hypopharynx.  Lung apices demonstrate only mild centrilobular emphysema.  Symmetric thyroid gland, submandibular glands, and parotid glands. No cervical adenopathy.  No subcutaneous edema.  Atherosclerosis of the aorta level of the carotid artery bifurcations bilaterally.  Marked cervical spondylosis.  Probable cerumen within the left external ear canal. Clear mastoid air cells.  Prominent flowing anterior osteophytes as can be seen with diffuse idiopathic skeletal hyperostosis.  IMPRESSION:  1.  Mild degradation secondary to lack of IV contrast and minimal motion. 2.  Given this factor, no explanation for elevated white blood cell count.  No evidence of fluid collection to suggest abscess. 3.  Prominent anterior osteophytes throughout the cervical spine, suggesting diffuse idiopathic skeletal hyperostosis.  These could possibly cause dysphagia, given severity.   Original Report Authenticated By: Jeronimo Greaves, M.D.    Dg Chest Portable 1 View  12/07/2012  *RADIOLOGY REPORT*  Clinical Data: Headache, hypertension  PORTABLE CHEST - 1 VIEW  Comparison:  05/07/2010 CT  Findings: Heart size upper normal.  Mild aortic tortuosity.  Lungs predominately clear. No pneumothorax or pleural effusion.  No interval osseous change.  Surgical clips right upper quadrant.  IMPRESSION: No radiographic evidence of acute cardiopulmonary process.   Original Report Authenticated By: Jearld Lesch, M.D.     Assessment/Plan 79 yo male with fever  Principal Problem:   Dehydration, mild Active Problems:   HYPERTENSION   STRICTURE, ESOPHAGEAL   CKD (chronic kidney disease)   Fever chills   Leukocytosis  obs overnight.  Hold on any abx, wbc is elevated.  nonfocal exam.  bc pending.  Also obtain mbss to see if he may need egd eventually for esophageal dilation but don't think this is related to his febrile illness.  obs on med bed.    Samay Delcarlo A 12/08/2012, 12:10 AM

## 2012-12-08 NOTE — Progress Notes (Signed)
Speech Language Pathology  Patient Details Name: [name] MRN: [id number] DOB: Jan 05, 1925 Today's Date: 12/08/2012 Time:  -    Per chart review, MD concerned with esophageal function with history of stricture.  Pt. Would benefit more from an esophagram.  Spoke with Dr. Arthor Captain who stated he will order barium esophagram (no MBS needed).  [name].Ed CCC-SLP Pager [phone]  12/08/2012'  12/08/2012

## 2012-12-09 DIAGNOSIS — E119 Type 2 diabetes mellitus without complications: Secondary | ICD-10-CM

## 2012-12-09 DIAGNOSIS — D649 Anemia, unspecified: Secondary | ICD-10-CM

## 2012-12-09 LAB — CBC
HCT: 30.4 % — ABNORMAL LOW (ref 39.0–52.0)
MCHC: 34.5 g/dL (ref 30.0–36.0)
MCV: 87.9 fL (ref 78.0–100.0)
RDW: 12 % (ref 11.5–15.5)

## 2012-12-09 LAB — URINE CULTURE

## 2012-12-09 MED ORDER — CIPROFLOXACIN HCL 500 MG PO TABS: 500.0000 mg | ORAL_TABLET | Freq: Two times a day (BID) | ORAL | Status: DC

## 2012-12-09 NOTE — Progress Notes (Deleted)
[name] to be D/C'd Home per MD order.  Discussed with the patient and all questions fully answered.    Medication List    TAKE these medications       amLODipine 10 MG tablet  Commonly known as:  NORVASC  Take 10 mg by mouth daily.     ciprofloxacin 500 MG tablet  Commonly known as:  CIPRO  Take 1 tablet (500 mg total) by mouth 2 (two) times daily.     cloNIDine 0.2 MG tablet  Commonly known as:  CATAPRES  Take 0.2 mg by mouth 2 (two) times daily.     metoprolol 50 MG tablet  Commonly known as:  LOPRESSOR  Take 50 mg by mouth 2 (two) times daily.     omeprazole 20 MG capsule  Commonly known as:  PRILOSEC  Take 20 mg by mouth daily.        VVS, Skin clean, dry and intact without evidence of skin break down, no evidence of skin tears noted. IV catheter discontinued intact. Site without signs and symptoms of complications. Dressing and pressure applied.  An After Visit Summary was printed and given to the patient. Follow up appointments , new prescriptions and medication administration times given. Education given on myalgia, smoking cessation, asthma. Bus pass given. Patient escorted downstairs to bus.  Cindra Eves, RN 12/09/2012 1:18 PM

## 2012-12-09 NOTE — Progress Notes (Signed)
Margie Billet to be D/C'd Home per MD order.  Discussed with the patient and all questions fully answered.    Medication List    TAKE these medications       amLODipine 10 MG tablet  Commonly known as:  NORVASC  Take 10 mg by mouth daily.     ciprofloxacin 500 MG tablet  Commonly known as:  CIPRO  Take 1 tablet (500 mg total) by mouth 2 (two) times daily.     cloNIDine 0.2 MG tablet  Commonly known as:  CATAPRES  Take 0.2 mg by mouth 2 (two) times daily.     metoprolol 50 MG tablet  Commonly known as:  LOPRESSOR  Take 50 mg by mouth 2 (two) times daily.     omeprazole 20 MG capsule  Commonly known as:  PRILOSEC  Take 20 mg by mouth daily.        VVS, Skin clean, dry and intact without evidence of skin break down, no evidence of skin tears noted. IV catheter discontinued intact. Site without signs and symptoms of complications. Dressing and pressure applied.  An After Visit Summary was printed and given to the patient. Follow up appointments , new prescriptions and medication administration times given. Education given on dehydration. Patient escorted via WC, and D/C home via private auto.  Cindra Eves, RN 12/09/2012 1:26 PM

## 2012-12-09 NOTE — Discharge Summary (Signed)
Physician Discharge Summary  [name] ZOX:[id number] DOB: 1925/05/24 DOA: 12/07/2012  PCP: Oliver Barre, MD  Admit date: 12/07/2012 Discharge date: 12/09/2012  Time spent: 45 minutes  Recommendations for Outpatient Follow-up:  1. Followup with his primary care physician in one to 2 weeks  Discharge Diagnoses:  Principal Problem:   Dehydration, mild Active Problems:   HYPERTENSION   STRICTURE, ESOPHAGEAL   CKD (chronic kidney disease)   Fever chills   Leukocytosis   Discharge Condition: Stable, no fever for 36 hours, tolerating solid diet  Diet recommendation:  Soft diet with frequent sips of liquid between bites  Filed Weights   12/08/12 0140  Weight: 65.4 kg (144 lb 2.9 oz)    History of present illness:   79 yo male with fever for one day with some mild confusion that has resolved with tylenol and ivf in ED. Pt reports no cp, no sob, no cough/n/v/d. No abd pain. No dysuria. No rashes. No flu like symptoms. Fever over 101 for last 24 hours. Also c/o dysphagia has h/o esoph stricture in past and is started to have same problem swallowing, ct neck was done to r/o absess which was neg. No sore throat. Asked to admit pt for fever, unclear etiology. No le edema or swelling.  Hospital Course:   UTI  - U/A appears border line for UTI. Culture shows gram-negative rods - ceftriaxone 1 g intravenously once on April 21. Will be discharged on Cipro 500 daily for an additional 6 days  - chronic renal failure: creatinine at baseline (1.8 - 2.0)   Fever with Leukocytosis  - Likely due to UTI  - Final Urine and blood cultures pending  -Tmax 101 on April 21 -Now resolved  Dysphagia  -Food "getting stuck and coming back up" after eating  -Hx of esophageal stricture in 2008. Balloon dilation of Schatzki's ring to 18 mm  -Prominent anterior osteophytes in cervical spine, suggesting skeletal hyperostosis.  could cause dysphagia, given severity. - Barium swallow showed tertiary  contractions. Barium pill passed through Schatzki's ring with a sip of liquid -Patient advised To followup with [facility] gastroenterology if swallowing continues to be a problem -He has been able to tolerate a soft diet in the hospital without vomiting  GERD  -omeprazole 20 MG PO outpatient   Hypertension  -Moderate control  -amlodipine 10 Mg  -Clonidine 50 mg  -metoprolol tablet 50 MG   Chronic Stage 3 Renal Failure  -Creatinine at baseline  -Will consider adding Ace-I   Discharge Exam: Filed Vitals:   12/09/12 0218 12/09/12 0455 12/09/12 1056 12/09/12 1057  BP: 121/70 162/75 159/76   Pulse:  79  80  Temp:  98.9 F (37.2 C)    TempSrc:  Oral    Resp:  24    Height:      Weight:      SpO2:  94%     General: He appears well-developed and well-nourished. Skin of extremities is flaky. Soft bulge above medial right clavicle.  HEENT normocephalic, atraumatic. External ears are normal. Oropharynx is clear and moist. No exudate on tonsils noted. Conjunctive are normal. PERRL.  Neck: With decreased range of motion on downward flexion. Not painful with movement Cardiovascular: Regular rhythm, normal heart sounds. Tachycardic.  Respiratory: Clear to auscultation. No respiratory distress, no wheezes, rales.  Abdomen: Non-tender to palpation. BS are normal. Soft without rebound, guarding, distention.    Discharge Instructions      Discharge Orders   Future Appointments Provider Department Dept Phone  02/05/2013 2:15 PM Corwin Levins, MD [facility] [phone]   Future Orders Complete By Expires     Diet - low sodium heart healthy  As directed     Increase activity slowly  As directed         Medication List    TAKE these medications       amLODipine 10 MG tablet  Commonly known as:  NORVASC  Take 10 mg by mouth daily.     ciprofloxacin 500 MG tablet  Commonly known as:  CIPRO  Take 1 tablet (500 mg total) by mouth 2 (two) times daily.      cloNIDine 0.2 MG tablet  Commonly known as:  CATAPRES  Take 0.2 mg by mouth 2 (two) times daily.     metoprolol 50 MG tablet  Commonly known as:  LOPRESSOR  Take 50 mg by mouth 2 (two) times daily.     omeprazole 20 MG capsule  Commonly known as:  PRILOSEC  Take 20 mg by mouth daily.       Follow-up Information   Follow up with Oliver Barre, MD. Schedule an appointment as soon as possible for a visit in 2 weeks.   Contact information:   [address] [name] [address] [phone]       Please follow up. (If Swallowing becomes worse please call [facility] for an appointment [phone])        The results of significant diagnostics from this hospitalization (including imaging, microbiology, ancillary and laboratory) are listed below for reference.    Significant Diagnostic Studies: Ct Head Wo Contrast  12/07/2012  *RADIOLOGY REPORT*  Clinical Data: Headache and hypertension for several days with lightheadedness.  CT HEAD WITHOUT CONTRAST  Technique:  Contiguous axial images were obtained from the base of the skull through the vertex without contrast.  Comparison: None  Findings: Bone windows demonstrate clear paranasal sinuses and mastoid air cells.  Soft tissue windows demonstrate expected cerebral atrophy.  Mild to moderate low density in the periventricular white matter likely related to small vessel disease. No  mass lesion, hemorrhage, hydrocephalus, acute infarct, intra-axial, or extra-axial fluid collection.  IMPRESSION:  1. No acute intracranial abnormality. 2. Cerebral atrophy and small vessel ischemic change.   Original Report Authenticated By: Jeronimo Greaves, M.D.    Ct Soft Tissue Neck Wo Contrast  12/07/2012  *RADIOLOGY REPORT*  Clinical Data: "Abscess." Elevated white blood cell count. Dysphagia.  CT NECK WITHOUT CONTRAST  Technique:  Multidetector CT imaging of the neck was performed without intravenous contrast.  Comparison: None.  Findings: Limited  intracranial imaging is within normal limits. Normal imaged appearance of the orbits and globes.  Normal nasopharynx, oropharynx.  Mild motion degradation at the level of the glottis.  Grossly normal larynx and hypopharynx.  Lung apices demonstrate only mild centrilobular emphysema.  Symmetric thyroid gland, submandibular glands, and parotid glands. No cervical adenopathy.  No subcutaneous edema.  Atherosclerosis of the aorta level of the carotid artery bifurcations bilaterally.  Marked cervical spondylosis.  Probable cerumen within the left external ear canal. Clear mastoid air cells.  Prominent flowing anterior osteophytes as can be seen with diffuse idiopathic skeletal hyperostosis.  IMPRESSION:  1.  Mild degradation secondary to lack of IV contrast and minimal motion. 2.  Given this factor, no explanation for elevated white blood cell count.  No evidence of fluid collection to suggest abscess. 3.  Prominent anterior osteophytes throughout the cervical spine, suggesting diffuse idiopathic  skeletal hyperostosis.  These could possibly cause dysphagia, given severity.   Original Report Authenticated By: Jeronimo Greaves, M.D.    Dg Esophagus  12/08/2012  *RADIOLOGY REPORT*  Clinical Data:Difficulty swallowing  ESOPHAGUS/BARIUM SWALLOW/TABLET STUDY  Fluoroscopy Time: 1 minute 48 seconds  Comparison: 05/26/2007  Findings: Single contrast study was performed given the patient's condition. No abnormalities seen in the hypopharyngeal region although it was difficult due to patient position and true lateral position.  A single contrast imaging of the esophagus shows no gross esophageal mass lesion.  There is a Schatzki's ring at the distal esophagus.  Primary peristalsis is largely maintained although there are tertiary contractions on all swallows.  The patient was given a 13 mm barium tablet which passed readily into the stomach when taken with water.  IMPRESSION: No evidence for esophageal mass lesion or stricture.  13  mm barium tablet passes readily through the distal esophageal/Schatzki ring.  Tertiary contractions with relatively good preservation of the primary peristaltic stripping wave.   Original Report Authenticated By: Kennith Center, M.D.    Dg Chest Portable 1 View  12/07/2012  *RADIOLOGY REPORT*  Clinical Data: Headache, hypertension  PORTABLE CHEST - 1 VIEW  Comparison: 05/07/2010 CT  Findings: Heart size upper normal.  Mild aortic tortuosity.  Lungs predominately clear. No pneumothorax or pleural effusion.  No interval osseous change.  Surgical clips right upper quadrant.  IMPRESSION: No radiographic evidence of acute cardiopulmonary process.   Original Report Authenticated By: Jearld Lesch, M.D.     Microbiology: Recent Results (from the past 240 hour(s))  URINE CULTURE     Status: None   Collection Time    12/07/12  9:00 PM      Result Value Range Status   Specimen Description URINE, CLEAN CATCH   Final   Special Requests NONE   Final   Culture  Setup Time 12/07/2012 21:00   Final   Colony Count 20,OOO COLONIES/ML   Final   Culture GRAM NEGATIVE RODS   Final   Report Status PENDING   Incomplete  RAPID STREP SCREEN     Status: None   Collection Time    12/08/12 12:04 AM      Result Value Range Status   Streptococcus, Group A Screen (Direct) NEGATIVE  NEGATIVE Final   Comment:            DUE TO INADEQUATE SENSITIVITY OF EIA     RAPID TESTS FOR GROUP A STREP (GAS)     IT IS RECOMMENDED THAT ALL NEGATIVE     RESULTS BE FOLLOWED BY A     GROUP A STREP PROBE.  CULTURE, BLOOD (ROUTINE X 2)     Status: None   Collection Time    12/08/12 12:05 AM      Result Value Range Status   Specimen Description BLOOD LEFT ARM   Final   Special Requests BOTTLES DRAWN AEROBIC AND ANAEROBIC 10CC EA   Final   Culture  Setup Time 12/08/2012 07:30   Final   Culture     Final   Value:        BLOOD CULTURE RECEIVED NO GROWTH TO DATE CULTURE WILL BE HELD FOR 5 DAYS BEFORE ISSUING A FINAL NEGATIVE REPORT    Report Status PENDING   Incomplete  CULTURE, BLOOD (ROUTINE X 2)     Status: None   Collection Time    12/08/12 12:15 AM      Result Value Range Status   Specimen Description BLOOD  LEFT HAND   Final   Special Requests BOTTLES DRAWN AEROBIC AND ANAEROBIC 10CC EA   Final   Culture  Setup Time 12/08/2012 07:30   Final   Culture     Final   Value:        BLOOD CULTURE RECEIVED NO GROWTH TO DATE CULTURE WILL BE HELD FOR 5 DAYS BEFORE ISSUING A FINAL NEGATIVE REPORT   Report Status PENDING   Incomplete     Labs: Basic Metabolic Panel:  Recent Labs Lab 12/07/12 2115 12/08/12 0535  NA 136 135  K 4.2 3.7  CL 98 99  CO2  --  25  GLUCOSE 141* 119*  BUN 21 20  CREATININE 1.90* 1.58*  CALCIUM  --  8.9   CBC:  Recent Labs Lab 12/07/12 2057 12/07/12 2115 12/08/12 0535 12/09/12 0555  WBC 16.2*  --  16.2* 11.4*  NEUTROABS 13.4*  --   --   --   HGB 12.4* 13.6 12.2* 10.5*  HCT 36.3* 40.0 35.8* 30.4*  MCV 88.3  --  88.4 87.9  PLT 197  --  201 187  CBG:  Recent Labs Lab 12/08/12 0149  GLUCAP 99   Signed:  YorkAntonietta Jewel  [facility] 12/09/2012, 2:20 PM

## 2012-12-09 NOTE — Discharge Summary (Signed)
Addendum  Patient seen and examined, chart and data base reviewed.  I agree with the above assessment and discharge plan.  For full details please see Mrs. Algis Downs PA note.   Clint Lipps, MD [facility] Pager: [phone] 12/09/2012, 3:40 PM

## 2012-12-14 LAB — CULTURE, BLOOD (ROUTINE X 2): Culture: NO GROWTH

## 2012-12-20 ENCOUNTER — Other Ambulatory Visit: Payer: Self-pay | Admitting: Internal Medicine

## 2013-01-05 ENCOUNTER — Other Ambulatory Visit: Payer: Self-pay | Admitting: Internal Medicine

## 2013-02-05 ENCOUNTER — Encounter: Payer: Self-pay | Admitting: Internal Medicine

## 2013-02-05 ENCOUNTER — Other Ambulatory Visit (INDEPENDENT_AMBULATORY_CARE_PROVIDER_SITE_OTHER): Payer: Medicare Other

## 2013-02-05 ENCOUNTER — Ambulatory Visit (INDEPENDENT_AMBULATORY_CARE_PROVIDER_SITE_OTHER): Payer: Medicare Other | Admitting: Internal Medicine

## 2013-02-05 VITALS — BP 140/84 | HR 77 | Temp 98.1°F | Ht 65.0 in | Wt 143.2 lb

## 2013-02-05 DIAGNOSIS — Z0000 Encounter for general adult medical examination without abnormal findings: Secondary | ICD-10-CM

## 2013-02-05 DIAGNOSIS — E119 Type 2 diabetes mellitus without complications: Secondary | ICD-10-CM

## 2013-02-05 LAB — HEPATIC FUNCTION PANEL
ALT: 9 U/L (ref 0–53)
AST: 17 U/L (ref 0–37)
Albumin: 4 g/dL (ref 3.5–5.2)
Total Bilirubin: 0.8 mg/dL (ref 0.3–1.2)
Total Protein: 7.9 g/dL (ref 6.0–8.3)

## 2013-02-05 LAB — BASIC METABOLIC PANEL
BUN: 20 mg/dL (ref 6–23)
Chloride: 103 mEq/L (ref 96–112)
Creatinine, Ser: 1.6 mg/dL — ABNORMAL HIGH (ref 0.4–1.5)
Glucose, Bld: 110 mg/dL — ABNORMAL HIGH (ref 70–99)
Potassium: 4.6 mEq/L (ref 3.5–5.1)

## 2013-02-05 LAB — MICROALBUMIN / CREATININE URINE RATIO: Creatinine,U: 344 mg/dL

## 2013-02-05 LAB — LIPID PANEL
HDL: 36.5 mg/dL — ABNORMAL LOW (ref >39.00)
LDL Cholesterol: 74 mg/dL (ref 0–99)
Total CHOL/HDL Ratio: 4
Triglycerides: 86 mg/dL (ref 0.0–149.0)

## 2013-02-05 LAB — TSH: TSH: 2.65 u[IU]/mL (ref 0.35–5.50)

## 2013-02-05 NOTE — Assessment & Plan Note (Signed)

## 2013-02-05 NOTE — Patient Instructions (Signed)
Please continue all other medications as before, and refills have been done if requested. Please continue your efforts at being more active, low cholesterol diet, and weight control. You are otherwise up to date with prevention measures today. Please keep your appointments with your specialists as you may have planned Please go to the LAB in the Basement (turn left off the elevator) for the tests to be done today You will be contacted by phone if any changes need to be made immediately.  Otherwise, you will receive a letter about your results with an explanation, but please check with MyChart first. Please return in 6 months, or sooner if needed

## 2013-02-05 NOTE — Assessment & Plan Note (Signed)
stable overall by history and exam, recent data reviewed with pt, and pt to continue medical treatment as before,  to f/u any worsening symptoms or concerns Lab Results  Component Value Date   HGBA1C 6.9* 08/07/2012

## 2013-02-05 NOTE — Progress Notes (Signed)
Subjective:    Patient ID: [name], male    DOB: 1924-09-05, 79 y.o.   MRN: [id number]  HPI  Here for wellness and f/u;  Overall doing ok;  Pt denies CP, worsening SOB, DOE, wheezing, orthopnea, PND, worsening LE edema, palpitations, dizziness or syncope.  Pt denies neurological change such as new headache, facial or extremity weakness.  Pt denies polydipsia, polyuria, or low sugar symptoms. Pt states overall good compliance with treatment and medications, good tolerability, and has been trying to follow lower cholesterol diet.  Pt denies worsening depressive symptoms, suicidal ideation or panic. No fever, night sweats, wt loss, loss of appetite, or other constitutional symptoms.  Pt states good ability with ADL's, has low fall risk, home safety reviewed and adequate, no other significant changes in hearing or vision, and only occasionally active with exercise. Does c/o ongoing fatigue, but denies signficant daytime hypersomnolence. No other acute complaints.  Had an episode of left lower back pain last wk for 3 days now resolved.   Past Medical History  Diagnosis Date  . DIABETES MELLITUS, TYPE II 12/24/2007  . HYPERLIPIDEMIA 03/24/2007  . ANEMIA-NOS 06/02/2007  . HEARING LOSS, LEFT EAR 01/18/2010  . HYPERTENSION 03/24/2007  . PULMONARY NODULE 06/02/2007  . STRICTURE, ESOPHAGEAL 06/02/2007  . GERD 06/02/2007  . GASTROENTERITIS, ACUTE 05/30/2010  . CONSTIPATION 01/18/2010  . CHOLELITHIASIS 06/02/2007  . RENAL INSUFFICIENCY 06/02/2007  . OSTEOARTHRITIS, KNEES, BILATERAL 01/26/2008  . FEVER UNSPECIFIED 01/18/2010  . Abdominal pain, right upper quadrant 06/12/2007  . EPIGASTRIC TENDERNESS 01/18/2010  . COLON CANCER, HX OF 03/24/2007  . NEPHROLITHIASIS, HX OF 06/02/2007   Past Surgical History  Procedure Laterality Date  . Small intestine surgery      reports that he has quit smoking. He does not have any smokeless tobacco history on file. He reports that he does not drink alcohol or use illicit  drugs. family history includes Hypertension in his other. No Known Allergies Current Outpatient Prescriptions on File Prior to Visit  Medication Sig Dispense Refill  . amLODipine (NORVASC) 10 MG tablet TAKE ONE TABLET BY MOUTH EVERY DAY  30 tablet  5  . ciprofloxacin (CIPRO) 500 MG tablet Take 1 tablet (500 mg total) by mouth 2 (two) times daily.  6 tablet  0  . cloNIDine (CATAPRES) 0.2 MG tablet Take 0.2 mg by mouth 2 (two) times daily.      . metoprolol (LOPRESSOR) 50 MG tablet TAKE ONE TABLET BY MOUTH TWICE DAILY  60 tablet  5  . omeprazole (PRILOSEC) 20 MG capsule Take 20 mg by mouth daily.      . metoprolol (LOPRESSOR) 50 MG tablet Take 50 mg by mouth 2 (two) times daily.       No current facility-administered medications on file prior to visit.   Review of Systems Constitutional: Negative for diaphoresis, activity change, appetite change or unexpected weight change.  HENT: Negative for hearing loss, ear pain, facial swelling, mouth sores and neck stiffness.   Eyes: Negative for pain, redness and visual disturbance.  Respiratory: Negative for shortness of breath and wheezing.   Cardiovascular: Negative for chest pain and palpitations.  Gastrointestinal: Negative for diarrhea, blood in stool, abdominal distention or other pain Genitourinary: Negative for hematuria, flank pain or change in urine volume.  Musculoskeletal: Negative for myalgias and joint swelling.  Skin: Negative for color change and wound.  Neurological: Negative for syncope and numbness. other than noted Hematological: Negative for adenopathy.  Psychiatric/Behavioral: Negative for hallucinations, self-injury, decreased concentration and  agitation.      Objective:   Physical Exam BP 140/84  Pulse 77  Temp(Src) 98.1 F (36.7 C) (Oral)  Ht 5\' 5"  (1.651 m)  Wt 143 lb 4 oz (64.978 kg)  BMI 23.84 kg/m2  SpO2 99% VS noted,  Constitutional: Pt is oriented to person, place, and time. Appears well-developed and  well-nourished.  Head: Normocephalic and atraumatic.  Right Ear: External ear normal.  Left Ear: External ear normal.  Nose: Nose normal.  Mouth/Throat: Oropharynx is clear and moist.  Eyes: Conjunctivae and EOM are normal. Pupils are equal, round, and reactive to light.  Neck: Normal range of motion. Neck supple. No JVD present. No tracheal deviation present.  Cardiovascular: Normal rate, regular rhythm, normal heart sounds and intact distal pulses.   Pulmonary/Chest: Effort normal and breath sounds normal.  Abdominal: Soft. Bowel sounds are normal. There is no tenderness. No HSM  Musculoskeletal: Normal range of motion. Exhibits no edema.  Lymphadenopathy:  Has no cervical adenopathy.  Neurological: Pt is alert and oriented to person, place, and time. Pt has normal reflexes. No cranial nerve deficit.  Skin: Skin is warm and dry. No rash noted.  Psychiatric:  Has  normal mood and affect. Behavior is normal.      Assessment & Plan:

## 2013-02-06 ENCOUNTER — Encounter: Payer: Self-pay | Admitting: Internal Medicine

## 2013-02-06 LAB — CBC WITH DIFFERENTIAL/PLATELET
Basophils Relative: 0.7 % (ref 0.0–3.0)
Eosinophils Relative: 3.7 % (ref 0.0–5.0)
Lymphocytes Relative: 21.8 % (ref 12.0–46.0)
MCV: 94.5 fl (ref 78.0–100.0)
Monocytes Absolute: 0.6 10*3/uL (ref 0.1–1.0)
Monocytes Relative: 9.9 % (ref 3.0–12.0)
Neutrophils Relative %: 63.9 % (ref 43.0–77.0)
Platelets: 275 10*3/uL (ref 150.0–400.0)
RBC: 3.96 Mil/uL — ABNORMAL LOW (ref 4.22–5.81)
WBC: 5.9 10*3/uL (ref 4.5–10.5)

## 2013-06-15 ENCOUNTER — Other Ambulatory Visit: Payer: Self-pay | Admitting: Internal Medicine

## 2013-07-27 ENCOUNTER — Other Ambulatory Visit: Payer: Self-pay | Admitting: Internal Medicine

## 2013-07-27 NOTE — Telephone Encounter (Signed)
Refill done.  

## 2013-08-06 ENCOUNTER — Ambulatory Visit: Payer: Medicare Other | Admitting: Internal Medicine

## 2013-08-11 ENCOUNTER — Encounter: Payer: Self-pay | Admitting: Internal Medicine

## 2013-08-11 ENCOUNTER — Other Ambulatory Visit (INDEPENDENT_AMBULATORY_CARE_PROVIDER_SITE_OTHER): Payer: Medicare Other

## 2013-08-11 ENCOUNTER — Ambulatory Visit (INDEPENDENT_AMBULATORY_CARE_PROVIDER_SITE_OTHER): Payer: Medicare Other | Admitting: Internal Medicine

## 2013-08-11 VITALS — BP 160/92 | HR 78 | Temp 97.6°F | Ht 65.0 in | Wt 147.2 lb

## 2013-08-11 DIAGNOSIS — E119 Type 2 diabetes mellitus without complications: Secondary | ICD-10-CM

## 2013-08-11 DIAGNOSIS — E785 Hyperlipidemia, unspecified: Secondary | ICD-10-CM

## 2013-08-11 DIAGNOSIS — Z23 Encounter for immunization: Secondary | ICD-10-CM

## 2013-08-11 DIAGNOSIS — I10 Essential (primary) hypertension: Secondary | ICD-10-CM

## 2013-08-11 LAB — LIPID PANEL
Cholesterol: 144 mg/dL (ref 0–200)
LDL Cholesterol: 83 mg/dL (ref 0–99)
VLDL: 22.8 mg/dL (ref 0.0–40.0)

## 2013-08-11 LAB — HEPATIC FUNCTION PANEL
ALT: 11 U/L (ref 0–53)
AST: 21 U/L (ref 0–37)
Alkaline Phosphatase: 71 U/L (ref 39–117)
Bilirubin, Direct: 0.1 mg/dL (ref 0.0–0.3)
Total Bilirubin: 0.8 mg/dL (ref 0.3–1.2)

## 2013-08-11 LAB — BASIC METABOLIC PANEL
BUN: 19 mg/dL (ref 6–23)
Chloride: 101 mEq/L (ref 96–112)
Creatinine, Ser: 1.9 mg/dL — ABNORMAL HIGH (ref 0.4–1.5)
GFR: 43.39 mL/min — ABNORMAL LOW (ref >60.00)
Potassium: 3.9 mEq/L (ref 3.5–5.1)
Sodium: 138 mEq/L (ref 135–145)

## 2013-08-11 NOTE — Patient Instructions (Signed)
You had the flu shot today Please continue all other medications as before, and refills have been done if requested. Please have the pharmacy call with any other refills you may need. Please continue your efforts at being more active, low cholesterol diabetic diet, and weight control. Please go to the LAB in the Basement (turn left off the elevator) for the tests to be done today You will be contacted by phone if any changes need to be made immediately.  Otherwise, you will receive a letter about your results with an explanation, but please check with MyChart first.  Please remember to sign up for My Chart if you have not done so, as this will be important to you in the future with finding out test results, communicating by private email, and scheduling acute appointments online when needed.  Please return in 6 months, or sooner if needed

## 2013-08-11 NOTE — Progress Notes (Signed)
Subjective:    Patient ID: [name], male    DOB: 1924-10-30, 79 y.o.   MRN: [id number]  HPI  Here to f/u; overall doing ok,  Pt denies chest pain, increased sob or doe, wheezing, orthopnea, PND, increased LE swelling, palpitations, dizziness or syncope.  Pt denies polydipsia, polyuria, or low sugar symptoms such as weakness or confusion improved with po intake.  Pt denies new neurological symptoms such as new headache, or facial or extremity weakness or numbness.   Pt states overall good compliance with meds, has been trying to follow lower cholesterol, diabetic diet, with wt overall stable,  but little exercise however. Admits to more salt in diet recent, plans to do better.  BP at home has been < 140/90 Past Medical History  Diagnosis Date  . DIABETES MELLITUS, TYPE II 12/24/2007  . HYPERLIPIDEMIA 03/24/2007  . ANEMIA-NOS 06/02/2007  . HEARING LOSS, LEFT EAR 01/18/2010  . HYPERTENSION 03/24/2007  . PULMONARY NODULE 06/02/2007  . STRICTURE, ESOPHAGEAL 06/02/2007  . GERD 06/02/2007  . GASTROENTERITIS, ACUTE 05/30/2010  . CONSTIPATION 01/18/2010  . CHOLELITHIASIS 06/02/2007  . RENAL INSUFFICIENCY 06/02/2007  . OSTEOARTHRITIS, KNEES, BILATERAL 01/26/2008  . FEVER UNSPECIFIED 01/18/2010  . Abdominal pain, right upper quadrant 06/12/2007  . EPIGASTRIC TENDERNESS 01/18/2010  . COLON CANCER, HX OF 03/24/2007  . NEPHROLITHIASIS, HX OF 06/02/2007   Past Surgical History  Procedure Laterality Date  . Small intestine surgery      reports that he has quit smoking. He does not have any smokeless tobacco history on file. He reports that he does not drink alcohol or use illicit drugs. family history includes Hypertension in his other. No Known Allergies Current Outpatient Prescriptions on File Prior to Visit  Medication Sig Dispense Refill  . amLODipine (NORVASC) 10 MG tablet TAKE ONE TABLET BY MOUTH ONCE DAILY  30 tablet  5  . cloNIDine (CATAPRES) 0.2 MG tablet Take 0.2 mg by mouth 2 (two) times daily.       . metoprolol (LOPRESSOR) 50 MG tablet TAKE ONE TABLET BY MOUTH TWICE DAILY  60 tablet  5  . omeprazole (PRILOSEC) 20 MG capsule Take 20 mg by mouth daily.      . metoprolol (LOPRESSOR) 50 MG tablet Take 50 mg by mouth 2 (two) times daily.       No current facility-administered medications on file prior to visit.    Review of Systems  Constitutional: Negative for unexpected weight change, or unusual diaphoresis  HENT: Negative for tinnitus.   Eyes: Negative for photophobia and visual disturbance.  Respiratory: Negative for choking and stridor.   Gastrointestinal: Negative for vomiting and blood in stool.  Genitourinary: Negative for hematuria and decreased urine volume.  Musculoskeletal: Negative for acute joint swelling Skin: Negative for color change and wound.  Neurological: Negative for tremors and numbness other than noted  Psychiatric/Behavioral: Negative for decreased concentration or  hyperactivity.       Objective:   Physical Exam BP 160/92  Pulse 78  Temp(Src) 97.6 F (36.4 C) (Oral)  Ht 5\' 5"  (1.651 m)  Wt 147 lb 4 oz (66.792 kg)  BMI 24.50 kg/m2  SpO2 94% VS noted, not ill appering, bright Constitutional: Pt appears well-developed and well-nourished.  HENT: Head: NCAT.  Right Ear: External ear normal.  Left Ear: External ear normal.  Eyes: Conjunctivae and EOM are normal. Pupils are equal, round, and reactive to light.  Neck: Normal range of motion. Neck supple.  Cardiovascular: Normal rate and regular rhythm.  Pulmonary/Chest: Effort normal and breath sounds normal.  Neurological: Pt is alert. Not confused  Skin: Skin is warm. No erythema.  Psychiatric: Pt behavior is normal. Thought content normal.     Assessment & Plan:

## 2013-08-11 NOTE — Assessment & Plan Note (Signed)
stable overall by history and exam, recent data reviewed with pt, and pt to continue medical treatment as before,  to f/u any worsening symptoms or concerns Lab Results  Component Value Date   HGBA1C 6.5 02/05/2013   For labs today

## 2013-08-11 NOTE — Progress Notes (Signed)
Pre-visit discussion using our clinic review tool. No additional management support is needed unless otherwise documented below in the visit note.  

## 2013-08-11 NOTE — Assessment & Plan Note (Addendum)
elev today, but stable overall by history and exam, recent data reviewed with pt, and pt to continue medical treatment as before - declines med change today,  to f/u any worsening symptoms or concerns, to work on low salt diet BP Readings from Last 3 Encounters:  08/11/13 160/92  02/05/13 140/84  12/09/12 159/76

## 2013-08-11 NOTE — Assessment & Plan Note (Signed)
stable overall by history and exam, recent data reviewed with pt, and pt to continue medical treatment as before,  to f/u any worsening symptoms or concerns Lab Results  Component Value Date   LDLCALC 74 02/05/2013

## 2013-08-14 ENCOUNTER — Encounter: Payer: Self-pay | Admitting: Internal Medicine

## 2013-08-31 ENCOUNTER — Other Ambulatory Visit: Payer: Self-pay | Admitting: Internal Medicine

## 2013-08-31 NOTE — Telephone Encounter (Signed)
Refill done.  

## 2013-10-06 ENCOUNTER — Other Ambulatory Visit: Payer: Self-pay | Admitting: Internal Medicine

## 2013-11-17 ENCOUNTER — Telehealth: Payer: Self-pay

## 2013-11-17 NOTE — Telephone Encounter (Signed)
Needs OV.  

## 2013-11-17 NOTE — Telephone Encounter (Signed)
Patient informed and did schedule appt. 

## 2013-11-17 NOTE — Telephone Encounter (Signed)
[employer]  RN went out for their yearly check yesterday 11/16/13.  States before taking morning medications the patients BP was 190/80 and later 180/78.  Urine had 2+ sugar and 1+ protein.  [employer] will not be out for another year, but felt this information should be reported to PCP.  Call back number for the patient [phone]

## 2013-11-24 ENCOUNTER — Ambulatory Visit (INDEPENDENT_AMBULATORY_CARE_PROVIDER_SITE_OTHER): Payer: Medicare Other | Admitting: Internal Medicine

## 2013-11-24 ENCOUNTER — Encounter: Payer: Self-pay | Admitting: Internal Medicine

## 2013-11-24 VITALS — BP 172/100 | HR 101 | Temp 97.4°F | Wt 149.2 lb

## 2013-11-24 DIAGNOSIS — E785 Hyperlipidemia, unspecified: Secondary | ICD-10-CM

## 2013-11-24 DIAGNOSIS — N189 Chronic kidney disease, unspecified: Secondary | ICD-10-CM

## 2013-11-24 DIAGNOSIS — E119 Type 2 diabetes mellitus without complications: Secondary | ICD-10-CM

## 2013-11-24 DIAGNOSIS — I10 Essential (primary) hypertension: Secondary | ICD-10-CM

## 2013-11-24 MED ORDER — HYDROCHLOROTHIAZIDE 25 MG PO TABS: 25.0000 mg | ORAL_TABLET | Freq: Every day | ORAL | Status: DC

## 2013-11-24 NOTE — Patient Instructions (Addendum)
Please take all new medication as prescribed - the fluid pill which works for blood pressure too  Please continue all other medications as before, and refills have been done if requested. Please have the pharmacy call with any other refills you may need.  Please continue your efforts at being more active, low cholesterol diet, and weight control.  Please return in 2-3 wks (or soon after that if possible), as you will need follow up blood testing

## 2013-11-24 NOTE — Assessment & Plan Note (Signed)
stable overall by history and exam, recent data reviewed with pt, and pt to continue medical treatment as before,  to f/u any worsening symptoms or concerns Lab Results  Component Value Date   LDLCALC 83 08/11/2013

## 2013-11-24 NOTE — Assessment & Plan Note (Signed)
Lab Results  Component Value Date   HGBA1C 6.7* 08/11/2013   stable overall by history and exam, recent data reviewed with pt, and pt to continue medical treatment as before,  to f/u any worsening symptoms or concerns

## 2013-11-24 NOTE — Assessment & Plan Note (Signed)
Uncontrolled, to add hct 25 qd, cont all other med, cont monitor bp, f/u next visit BP Readings from Last 3 Encounters:  11/24/13 172/100  08/11/13 160/92  02/05/13 140/84

## 2013-11-24 NOTE — Progress Notes (Signed)
Pre visit review using our clinic review tool, if applicable. No additional management support is needed unless otherwise documented below in the visit note. 

## 2013-11-24 NOTE — Assessment & Plan Note (Signed)
stable overall by history and exam, recent data reviewed with pt, and pt to continue medical treatment as before,  to f/u any worsening symptoms or concerns Lab Results  Component Value Date   CREATININE 1.9* 08/11/2013    

## 2013-11-24 NOTE — Progress Notes (Signed)
Subjective:    Patient ID: [name], male    DOB: 12/07/1924, 80 y.o.   MRN: [id number]  HPI  Here to f/u; overall doing ok,  Pt denies chest pain, increased sob or doe, wheezing, orthopnea, PND, increased LE swelling, palpitations, dizziness or syncope.  Pt denies polydipsia, polyuria, or low sugar symptoms such as weakness or confusion improved with po intake.  Pt denies new neurological symptoms such as new headache, or facial or extremity weakness or numbness.   Pt states overall good compliance with meds, has been trying to follow lower cholesterol, diabetic diet, with wt overall stable,  but little exercise however. BP at home per Home health nurse similar to today. Overall good compliance with treatment, and good medicine tolerability. Past Medical History  Diagnosis Date  . DIABETES MELLITUS, TYPE II 12/24/2007  . HYPERLIPIDEMIA 03/24/2007  . ANEMIA-NOS 06/02/2007  . HEARING LOSS, LEFT EAR 01/18/2010  . HYPERTENSION 03/24/2007  . PULMONARY NODULE 06/02/2007  . STRICTURE, ESOPHAGEAL 06/02/2007  . GERD 06/02/2007  . GASTROENTERITIS, ACUTE 05/30/2010  . CONSTIPATION 01/18/2010  . CHOLELITHIASIS 06/02/2007  . RENAL INSUFFICIENCY 06/02/2007  . OSTEOARTHRITIS, KNEES, BILATERAL 01/26/2008  . FEVER UNSPECIFIED 01/18/2010  . Abdominal pain, right upper quadrant 06/12/2007  . EPIGASTRIC TENDERNESS 01/18/2010  . COLON CANCER, HX OF 03/24/2007  . NEPHROLITHIASIS, HX OF 06/02/2007   Past Surgical History  Procedure Laterality Date  . Small intestine surgery      reports that he has quit smoking. He does not have any smokeless tobacco history on file. He reports that he does not drink alcohol or use illicit drugs. family history includes Hypertension in his other. No Known Allergies Current Outpatient Prescriptions on File Prior to Visit  Medication Sig Dispense Refill  . amLODipine (NORVASC) 10 MG tablet TAKE ONE TABLET BY MOUTH ONCE DAILY  30 tablet  5  . cloNIDine (CATAPRES) 0.2 MG tablet Take  0.2 mg by mouth 2 (two) times daily.      . cloNIDine (CATAPRES) 0.2 MG tablet TAKE ONE TABLET BY MOUTH TWICE DAILY  60 tablet  11  . metoprolol (LOPRESSOR) 50 MG tablet TAKE ONE TABLET BY MOUTH TWICE DAILY  60 tablet  5  . omeprazole (PRILOSEC) 20 MG capsule Take 20 mg by mouth daily.      Marland Kitchen. omeprazole (PRILOSEC) 20 MG capsule TAKE ONE CAPSULE BY MOUTH EVERY DAY  90 capsule  1   No current facility-administered medications on file prior to visit.   Review of Systems  Constitutional: Negative for unexpected weight change, or unusual diaphoresis  HENT: Negative for tinnitus.   Eyes: Negative for photophobia and visual disturbance.  Respiratory: Negative for choking and stridor.   Gastrointestinal: Negative for vomiting and blood in stool.  Genitourinary: Negative for hematuria and decreased urine volume.  Musculoskeletal: Negative for acute joint swelling Skin: Negative for color change and wound.  Neurological: Negative for tremors and numbness other than noted  Psychiatric/Behavioral: Negative for decreased concentration or  hyperactivity.       Objective:   Physical Exam BP 172/100  Pulse 101  Temp(Src) 97.4 F (36.3 C) (Oral)  Wt 149 lb 4 oz (67.699 kg)  SpO2 97%  VS noted, NAD Constitutional: Pt appears well-developed and well-nourished.  HENT: Head: NCAT.  Right Ear: External ear normal.  Left Ear: External ear normal.  Eyes: Conjunctivae and EOM are normal. Pupils are equal, round, and reactive to light.  Neck: Normal range of motion. Neck supple.  Cardiovascular: Normal  rate and regular rhythm.   Pulmonary/Chest: Effort normal and breath sounds decreased bilat , no rales or wheezing Neurological: Pt is alert. Not confused  Skin: Skin is warm. No erythema.  Psychiatric: Pt behavior is normal. Thought content normal. .      Assessment & Plan:

## 2013-12-08 ENCOUNTER — Other Ambulatory Visit (INDEPENDENT_AMBULATORY_CARE_PROVIDER_SITE_OTHER): Payer: Medicare Other

## 2013-12-08 ENCOUNTER — Encounter: Payer: Self-pay | Admitting: Internal Medicine

## 2013-12-08 ENCOUNTER — Ambulatory Visit (INDEPENDENT_AMBULATORY_CARE_PROVIDER_SITE_OTHER): Payer: Medicare Other | Admitting: Internal Medicine

## 2013-12-08 VITALS — BP 152/100 | HR 98 | Temp 96.4°F | Wt 146.5 lb

## 2013-12-08 DIAGNOSIS — E119 Type 2 diabetes mellitus without complications: Secondary | ICD-10-CM

## 2013-12-08 DIAGNOSIS — I10 Essential (primary) hypertension: Secondary | ICD-10-CM

## 2013-12-08 DIAGNOSIS — N189 Chronic kidney disease, unspecified: Secondary | ICD-10-CM

## 2013-12-08 LAB — BASIC METABOLIC PANEL
BUN: 23 mg/dL (ref 6–23)
CALCIUM: 8.7 mg/dL (ref 8.4–10.5)
CO2: 29 mEq/L (ref 19–32)
CREATININE: 1.9 mg/dL — AB (ref 0.4–1.5)
Chloride: 100 mEq/L (ref 96–112)
GFR: 44.44 mL/min — ABNORMAL LOW (ref >60.00)
GLUCOSE: 123 mg/dL — AB (ref 70–99)
Potassium: 3.7 mEq/L (ref 3.5–5.1)
SODIUM: 139 meq/L (ref 135–145)

## 2013-12-08 LAB — LIPID PANEL
CHOL/HDL RATIO: 4
Cholesterol: 120 mg/dL (ref 0–200)
HDL: 33.7 mg/dL — ABNORMAL LOW (ref >39.00)
LDL Cholesterol: 60 mg/dL (ref 0–99)
TRIGLYCERIDES: 131 mg/dL (ref 0.0–149.0)
VLDL: 26.2 mg/dL (ref 0.0–40.0)

## 2013-12-08 LAB — HEPATIC FUNCTION PANEL
ALBUMIN: 4.1 g/dL (ref 3.5–5.2)
ALK PHOS: 80 U/L (ref 39–117)
ALT: 10 U/L (ref 0–53)
AST: 21 U/L (ref 0–37)
BILIRUBIN DIRECT: 0.1 mg/dL (ref 0.0–0.3)
Total Bilirubin: 0.7 mg/dL (ref 0.3–1.2)
Total Protein: 8.4 g/dL — ABNORMAL HIGH (ref 6.0–8.3)

## 2013-12-08 LAB — HEMOGLOBIN A1C: Hgb A1c MFr Bld: 6.5 % (ref 4.6–6.5)

## 2013-12-08 NOTE — Patient Instructions (Signed)
OK to stop the metoprolol (lopressor)   Please take all new medication as prescribed - the labetolol 200 mg twice per day  Please continue all other medications as before . Please have the pharmacy call with any other refills you may need.  Please go to the LAB in the Basement (turn left off the elevator) for the tests to be done today  You will be contacted by phone if any changes need to be made immediately.  Otherwise, you will receive a letter about your results with an explanation, but please check with MyChart first.  Please remember to sign up for MyChart if you have not done so, as this will be important to you in the future with finding out test results, communicating by private email, and scheduling acute appointments online when needed.  Please return in 1 month, or sooner if needed

## 2013-12-08 NOTE — Assessment & Plan Note (Signed)
stable overall by history and exam, recent data reviewed with pt, and pt to continue medical treatment as before,  to f/u any worsening symptoms or concerns Lab Results  Component Value Date   HGBA1C 6.7* 08/11/2013

## 2013-12-08 NOTE — Progress Notes (Signed)
Subjective:    Patient ID: Margie Billetlfred Ding, male    DOB: 04/01/1925, 80 y.o.   MRN: [id number]  HPI  Here to f/u; overall doing ok,  Pt denies chest pain, increased sob or doe, wheezing, orthopnea, PND, increased LE swelling, palpitations, dizziness or syncope.  Pt denies polydipsia, polyuria, or low sugar symptoms such as weakness or confusion improved with po intake.  Pt denies new neurological symptoms such as new headache, or facial or extremity weakness or numbness.   Pt states overall good compliance with meds, has been trying to follow lower cholesterol diet, with wt overall stable,  but little exercise however.  Overal feels improved with addition of hct last visit.   Past Medical History  Diagnosis Date  . DIABETES MELLITUS, TYPE II 12/24/2007  . HYPERLIPIDEMIA 03/24/2007  . ANEMIA-NOS 06/02/2007  . HEARING LOSS, LEFT EAR 01/18/2010  . HYPERTENSION 03/24/2007  . PULMONARY NODULE 06/02/2007  . STRICTURE, ESOPHAGEAL 06/02/2007  . GERD 06/02/2007  . GASTROENTERITIS, ACUTE 05/30/2010  . CONSTIPATION 01/18/2010  . CHOLELITHIASIS 06/02/2007  . RENAL INSUFFICIENCY 06/02/2007  . OSTEOARTHRITIS, KNEES, BILATERAL 01/26/2008  . FEVER UNSPECIFIED 01/18/2010  . Abdominal pain, right upper quadrant 06/12/2007  . EPIGASTRIC TENDERNESS 01/18/2010  . COLON CANCER, HX OF 03/24/2007  . NEPHROLITHIASIS, HX OF 06/02/2007   Past Surgical History  Procedure Laterality Date  . Small intestine surgery      reports that he has quit smoking. He does not have any smokeless tobacco history on file. He reports that he does not drink alcohol or use illicit drugs. family history includes Hypertension in his other. No Known Allergies Current Outpatient Prescriptions on File Prior to Visit  Medication Sig Dispense Refill  . amLODipine (NORVASC) 10 MG tablet TAKE ONE TABLET BY MOUTH ONCE DAILY  30 tablet  5  . cloNIDine (CATAPRES) 0.2 MG tablet TAKE ONE TABLET BY MOUTH TWICE DAILY  60 tablet  11  . hydrochlorothiazide  (HYDRODIURIL) 25 MG tablet Take 1 tablet (25 mg total) by mouth daily.  90 tablet  3  . metoprolol (LOPRESSOR) 50 MG tablet TAKE ONE TABLET BY MOUTH TWICE DAILY  60 tablet  5  . omeprazole (PRILOSEC) 20 MG capsule Take 20 mg by mouth daily.       No current facility-administered medications on file prior to visit.    Review of Systems  Constitutional: Negative for unexpected weight change, or unusual diaphoresis  HENT: Negative for tinnitus.   Eyes: Negative for photophobia and visual disturbance.  Respiratory: Negative for choking and stridor.   Gastrointestinal: Negative for vomiting and blood in stool.  Genitourinary: Negative for hematuria and decreased urine volume.  Musculoskeletal: Negative for acute joint swelling Skin: Negative for color change and wound.  Neurological: Negative for tremors and numbness other than noted  Psychiatric/Behavioral: Negative for decreased concentration or  hyperactivity.       Objective:   Physical Exam BP 152/100  Pulse 98  Temp(Src) 96.4 F (35.8 C) (Oral)  Wt 146 lb 8 oz (66.452 kg)  SpO2 98% VS noted,  Constitutional: Pt appears well-developed and well-nourished.  HENT: Head: NCAT.  Right Ear: External ear normal.  Left Ear: External ear normal.  Eyes: Conjunctivae and EOM are normal. Pupils are equal, round, and reactive to light.  Neck: Normal range of motion. Neck supple.  Cardiovascular: Normal rate and regular rhythm.   Pulmonary/Chest: Effort normal and breath sounds normal.  Abd:  Soft, NT, non-distended, + BS Neurological: Pt is alert. Not  confused  Skin: Skin is warm. No erythema. No LE edema Psychiatric: Pt behavior is normal. Thought content normal.     Assessment & Plan:

## 2013-12-08 NOTE — Assessment & Plan Note (Signed)
Improved, but still mild elevated, cont all meds except to change the metoprolol to labetolol 200 bid, f/u 2 wks, for lab eval today r/o k or cr change

## 2013-12-08 NOTE — Assessment & Plan Note (Signed)
stable overall by history and exam, recent data reviewed with pt, and pt to continue medical treatment as before,  to f/u any worsening symptoms or concerns Lab Results  Component Value Date   CREATININE 1.9* 08/11/2013

## 2013-12-08 NOTE — Progress Notes (Signed)
Pre visit review using our clinic review tool, if applicable. No additional management support is needed unless otherwise documented below in the visit note. 

## 2013-12-11 ENCOUNTER — Telehealth: Payer: Self-pay

## 2013-12-11 NOTE — Telephone Encounter (Signed)
Relevant patient education mailed to patient.  

## 2013-12-19 ENCOUNTER — Other Ambulatory Visit: Payer: Self-pay | Admitting: Internal Medicine

## 2014-01-08 ENCOUNTER — Ambulatory Visit (INDEPENDENT_AMBULATORY_CARE_PROVIDER_SITE_OTHER): Payer: Medicare Other | Admitting: Internal Medicine

## 2014-01-08 ENCOUNTER — Encounter: Payer: Self-pay | Admitting: Internal Medicine

## 2014-01-08 VITALS — BP 134/82 | HR 76 | Temp 97.0°F | Wt 145.0 lb

## 2014-01-08 DIAGNOSIS — E785 Hyperlipidemia, unspecified: Secondary | ICD-10-CM

## 2014-01-08 DIAGNOSIS — Z0000 Encounter for general adult medical examination without abnormal findings: Secondary | ICD-10-CM

## 2014-01-08 DIAGNOSIS — I10 Essential (primary) hypertension: Secondary | ICD-10-CM

## 2014-01-08 DIAGNOSIS — E119 Type 2 diabetes mellitus without complications: Secondary | ICD-10-CM

## 2014-01-08 DIAGNOSIS — Z23 Encounter for immunization: Secondary | ICD-10-CM

## 2014-01-08 NOTE — Patient Instructions (Signed)
You had the new Prevnar pneumonia shot today  Please continue all other medications as before, and refills have been done if requested.  Please have the pharmacy call with any other refills you may need.  Please return in 6 months, or sooner if needed, with Lab testing done 3-5 days before

## 2014-01-08 NOTE — Progress Notes (Signed)
Subjective:    Patient ID: [name], male    DOB: [birth year], 80 y.o.   MRN: [id number]  HPI  Here to f/u; overall doing ok,  Pt denies chest pain, increased sob or doe, wheezing, orthopnea, PND, increased LE swelling, palpitations, dizziness or syncope.  Pt denies polydipsia, polyuria, or low sugar symptoms such as weakness or confusion improved with po intake.  Pt denies new neurological symptoms such as new headache, or facial or extremity weakness or numbness.   Pt states overall good compliance with meds, has been trying to follow lower cholesterol, diabetic diet, with wt overall stable,  but little exercise however.Plans to do more at least 2-3 times /wk.   Past Medical History  Diagnosis Date  . DIABETES MELLITUS, TYPE II 12/24/2007  . HYPERLIPIDEMIA 03/24/2007  . ANEMIA-NOS 06/02/2007  . HEARING LOSS, LEFT EAR 01/18/2010  . HYPERTENSION 03/24/2007  . PULMONARY NODULE 06/02/2007  . STRICTURE, ESOPHAGEAL 06/02/2007  . GERD 06/02/2007  . GASTROENTERITIS, ACUTE 05/30/2010  . CONSTIPATION 01/18/2010  . CHOLELITHIASIS 06/02/2007  . RENAL INSUFFICIENCY 06/02/2007  . OSTEOARTHRITIS, KNEES, BILATERAL 01/26/2008  . FEVER UNSPECIFIED 01/18/2010  . Abdominal pain, right upper quadrant 06/12/2007  . EPIGASTRIC TENDERNESS 01/18/2010  . COLON CANCER, HX OF 03/24/2007  . NEPHROLITHIASIS, HX OF 06/02/2007   Past Surgical History  Procedure Laterality Date  . Small intestine surgery      reports that he has quit smoking. He does not have any smokeless tobacco history on file. He reports that he does not drink alcohol or use illicit drugs. family history includes Hypertension in his other. No Known Allergies  Current Outpatient Prescriptions on File Prior to Visit  Medication Sig Dispense Refill  . amLODipine (NORVASC) 10 MG tablet TAKE ONE TABLET BY MOUTH ONCE DAILY  90 tablet  3  . cloNIDine (CATAPRES) 0.2 MG tablet TAKE ONE TABLET BY MOUTH TWICE DAILY  60 tablet  11  . hydrochlorothiazide  (HYDRODIURIL) 25 MG tablet Take 1 tablet (25 mg total) by mouth daily.  90 tablet  3  . metoprolol (LOPRESSOR) 50 MG tablet TAKE ONE TABLET BY MOUTH TWICE DAILY  60 tablet  5  . omeprazole (PRILOSEC) 20 MG capsule Take 20 mg by mouth daily.       No current facility-administered medications on file prior to visit.   Review of Systems  Constitutional: Negative for unusual diaphoresis or other sweats  HENT: Negative for ringing in ear Eyes: Negative for double vision or worsening visual disturbance.  Respiratory: Negative for choking and stridor.   Gastrointestinal: Negative for vomiting or other signifcant bowel change Genitourinary: Negative for hematuria or decreased urine volume.  Musculoskeletal: Negative for other MSK pain or swelling Skin: Negative for color change and worsening wound.  Neurological: Negative for tremors and numbness other than noted  Psychiatric/Behavioral: Negative for decreased concentration or agitation other than above       Objective:   Physical Exam BP 134/82  Pulse 76  Temp(Src) 97 F (36.1 C) (Oral)  Wt 145 lb (65.772 kg)  SpO2 99% VS noted,  Constitutional: Pt appears well-developed, well-nourished.  HENT: Head: NCAT.  Right Ear: External ear normal.  Left Ear: External ear normal.  Eyes: . Pupils are equal, round, and reactive to light. Conjunctivae and EOM are normal Neck: Normal range of motion. Neck supple.  Cardiovascular: Normal rate and regular rhythm.   Pulmonary/Chest: Effort normal and breath sounds normal.  Abd:  Soft, NT, ND, + BS Neurological: Pt is  alert. Not confused , motor grossly intact Skin: Skin is warm. No rash Psychiatric: Pt behavior is normal. No agitation.         Assessment & Plan:

## 2014-01-08 NOTE — Progress Notes (Signed)
Pre visit review using our clinic review tool, if applicable. No additional management support is needed unless otherwise documented below in the visit note. 

## 2014-01-10 NOTE — Assessment & Plan Note (Signed)
stable overall by history and exam, recent data reviewed with pt, and pt to continue medical treatment as before,  to f/u any worsening symptoms or concerns BP Readings from Last 3 Encounters:  01/08/14 134/82  12/08/13 152/100  11/24/13 172/100

## 2014-01-10 NOTE — Assessment & Plan Note (Signed)
stable overall by history and exam, recent data reviewed with pt, and pt to continue medical treatment as before,  to f/u any worsening symptoms or concerns Lab Results  Component Value Date   LDLCALC 60 12/08/2013

## 2014-01-10 NOTE — Assessment & Plan Note (Signed)
stable overall by history and exam, recent data reviewed with pt, and pt to continue medical treatment as before,  to f/u any worsening symptoms or concerns Lab Results  Component Value Date   HGBA1C 6.5 12/08/2013

## 2014-02-11 ENCOUNTER — Ambulatory Visit: Payer: Medicare Other | Admitting: Internal Medicine

## 2014-02-16 ENCOUNTER — Other Ambulatory Visit: Payer: Self-pay | Admitting: Internal Medicine

## 2014-03-15 ENCOUNTER — Other Ambulatory Visit: Payer: Self-pay | Admitting: Internal Medicine

## 2014-04-28 ENCOUNTER — Encounter (HOSPITAL_COMMUNITY): Payer: Self-pay | Admitting: Emergency Medicine

## 2014-04-28 ENCOUNTER — Emergency Department (HOSPITAL_COMMUNITY)
Admission: EM | Admit: 2014-04-28 | Discharge: 2014-04-28 | Discharge status: Against Medical Advice | Payer: Medicare Other | Attending: Emergency Medicine | Admitting: Emergency Medicine

## 2014-04-28 DIAGNOSIS — I10 Essential (primary) hypertension: Secondary | ICD-10-CM | POA: Insufficient documentation

## 2014-04-28 DIAGNOSIS — H9190 Unspecified hearing loss, unspecified ear: Secondary | ICD-10-CM | POA: Diagnosis not present

## 2014-04-28 DIAGNOSIS — E119 Type 2 diabetes mellitus without complications: Secondary | ICD-10-CM | POA: Diagnosis not present

## 2014-04-28 DIAGNOSIS — R1084 Generalized abdominal pain: Secondary | ICD-10-CM | POA: Diagnosis present

## 2014-04-28 NOTE — ED Notes (Signed)
Pt c/o generalized abd pain x 2 weeks with poor PO intake; pt denies N/V/D

## 2014-04-28 NOTE — ED Notes (Signed)
Pt left AMA without signing. 

## 2014-07-13 ENCOUNTER — Encounter: Payer: Self-pay | Admitting: Internal Medicine

## 2014-07-13 ENCOUNTER — Ambulatory Visit (INDEPENDENT_AMBULATORY_CARE_PROVIDER_SITE_OTHER): Payer: Medicare Other | Admitting: Internal Medicine

## 2014-07-13 ENCOUNTER — Other Ambulatory Visit (INDEPENDENT_AMBULATORY_CARE_PROVIDER_SITE_OTHER): Payer: Medicare Other

## 2014-07-13 VITALS — BP 152/82 | HR 80 | Temp 97.4°F | Ht 65.0 in | Wt 140.1 lb

## 2014-07-13 DIAGNOSIS — E785 Hyperlipidemia, unspecified: Secondary | ICD-10-CM

## 2014-07-13 DIAGNOSIS — Z0000 Encounter for general adult medical examination without abnormal findings: Secondary | ICD-10-CM

## 2014-07-13 DIAGNOSIS — E119 Type 2 diabetes mellitus without complications: Secondary | ICD-10-CM

## 2014-07-13 DIAGNOSIS — I10 Essential (primary) hypertension: Secondary | ICD-10-CM

## 2014-07-13 DIAGNOSIS — Z23 Encounter for immunization: Secondary | ICD-10-CM

## 2014-07-13 LAB — URINALYSIS, ROUTINE W REFLEX MICROSCOPIC
Bilirubin Urine: NEGATIVE
Ketones, ur: NEGATIVE
Leukocytes, UA: NEGATIVE
Nitrite: NEGATIVE
Specific Gravity, Urine: 1.02 (ref 1.000–1.030)
Total Protein, Urine: 100 — AB
URINE GLUCOSE: 250 — AB
UROBILINOGEN UA: 1 (ref 0.0–1.0)
pH: 6 (ref 5.0–8.0)

## 2014-07-13 LAB — CBC WITH DIFFERENTIAL/PLATELET
BASOS ABS: 0 10*3/uL (ref 0.0–0.1)
Basophils Relative: 0.4 % (ref 0.0–3.0)
EOS ABS: 0.2 10*3/uL (ref 0.0–0.7)
Eosinophils Relative: 3.2 % (ref 0.0–5.0)
HCT: 37.9 % — ABNORMAL LOW (ref 39.0–52.0)
HEMOGLOBIN: 12.3 g/dL — AB (ref 13.0–17.0)
LYMPHS PCT: 23.5 % (ref 12.0–46.0)
Lymphs Abs: 1.2 10*3/uL (ref 0.7–4.0)
MCHC: 32.4 g/dL (ref 30.0–36.0)
MCV: 98.2 fl (ref 78.0–100.0)
Monocytes Absolute: 0.5 10*3/uL (ref 0.1–1.0)
Monocytes Relative: 10.4 % (ref 3.0–12.0)
NEUTROS ABS: 3.2 10*3/uL (ref 1.4–7.7)
NEUTROS PCT: 62.5 % (ref 43.0–77.0)
PLATELETS: 277 10*3/uL (ref 150.0–400.0)
RBC: 3.86 Mil/uL — ABNORMAL LOW (ref 4.22–5.81)
RDW: 13.7 % (ref 11.5–15.5)
WBC: 5.2 10*3/uL (ref 4.0–10.5)

## 2014-07-13 LAB — HEMOGLOBIN A1C: HEMOGLOBIN A1C: 6.5 % (ref 4.6–6.5)

## 2014-07-13 LAB — TSH: TSH: 4.01 u[IU]/mL (ref 0.35–4.50)

## 2014-07-13 NOTE — Patient Instructions (Signed)

## 2014-07-13 NOTE — Progress Notes (Signed)
Pre visit review using our clinic review tool, if applicable. No additional management support is needed unless otherwise documented below in the visit note. 

## 2014-07-13 NOTE — Progress Notes (Signed)
Subjective:    Patient ID: Margie Billetlfred Labus, male    DOB: 07/27/1925, 80 y.o.   MRN: [id number]  HPI Here for wellness and f/u;  Overall doing ok;  Pt denies CP, worsening SOB, DOE, wheezing, orthopnea, PND, worsening LE edema, palpitations, dizziness or syncope.  Pt denies neurological change such as new headache, facial or extremity weakness.  Pt denies polydipsia, polyuria, or low sugar symptoms. Pt states overall good compliance with treatment and medications, good tolerability, and has been trying to follow lower cholesterol diet.  Pt denies worsening depressive symptoms, suicidal ideation or panic. No fever, night sweats, wt loss, loss of appetite, or other constitutional symptoms.  Pt states good ability with ADL's, has low fall risk, home safety reviewed and adequate, no other significant changes in hearing or vision, and only occasionally active with exercise. No current compalints., Slowing down overall, but no pain Past Medical History  Diagnosis Date  . DIABETES MELLITUS, TYPE II 12/24/2007  . HYPERLIPIDEMIA 03/24/2007  . ANEMIA-NOS 06/02/2007  . HEARING LOSS, LEFT EAR 01/18/2010  . HYPERTENSION 03/24/2007  . PULMONARY NODULE 06/02/2007  . STRICTURE, ESOPHAGEAL 06/02/2007  . GERD 06/02/2007  . GASTROENTERITIS, ACUTE 05/30/2010  . CONSTIPATION 01/18/2010  . CHOLELITHIASIS 06/02/2007  . RENAL INSUFFICIENCY 06/02/2007  . OSTEOARTHRITIS, KNEES, BILATERAL 01/26/2008  . FEVER UNSPECIFIED 01/18/2010  . Abdominal pain, right upper quadrant 06/12/2007  . EPIGASTRIC TENDERNESS 01/18/2010  . COLON CANCER, HX OF 03/24/2007  . NEPHROLITHIASIS, HX OF 06/02/2007   Past Surgical History  Procedure Laterality Date  . Small intestine surgery      reports that he has quit smoking. He does not have any smokeless tobacco history on file. He reports that he does not drink alcohol or use illicit drugs. family history includes Hypertension in his other. No Known Allergies Current Outpatient Prescriptions on File  Prior to Visit  Medication Sig Dispense Refill  . amLODipine (NORVASC) 10 MG tablet TAKE ONE TABLET BY MOUTH ONCE DAILY 90 tablet 3  . cloNIDine (CATAPRES) 0.2 MG tablet TAKE ONE TABLET BY MOUTH TWICE DAILY 60 tablet 11  . hydrochlorothiazide (HYDRODIURIL) 25 MG tablet Take 1 tablet (25 mg total) by mouth daily. 90 tablet 3  . metoprolol (LOPRESSOR) 50 MG tablet TAKE ONE TABLET BY MOUTH TWICE DAILY 60 tablet 11  . omeprazole (PRILOSEC) 20 MG capsule Take 20 mg by mouth daily.    Marland Kitchen. omeprazole (PRILOSEC) 20 MG capsule TAKE ONE CAPSULE BY MOUTH ONCE DAILY 90 capsule 3   No current facility-administered medications on file prior to visit.   Review of Systems Constitutional: Negative for increased diaphoresis, other activity, appetite or other siginficant weight change  HENT: Negative for worsening hearing loss, ear pain, facial swelling, mouth sores and neck stiffness.   Eyes: Negative for other worsening pain, redness or visual disturbance.  Respiratory: Negative for shortness of breath and wheezing.   Cardiovascular: Negative for chest pain and palpitations.  Gastrointestinal: Negative for diarrhea, blood in stool, abdominal distention or other pain Genitourinary: Negative for hematuria, flank pain or change in urine volume.  Musculoskeletal: Negative for myalgias or other joint complaints.  Skin: Negative for color change and wound.  Neurological: Negative for syncope and numbness. other than noted Hematological: Negative for adenopathy. or other swelling Psychiatric/Behavioral: Negative for hallucinations, self-injury, decreased concentration or other worsening agitation.      Objective:   Physical Exam BP 152/82 mmHg  Pulse 80  Temp(Src) 97.4 F (36.3 C) (Oral)  Ht 5\' 5"  (1.651 m)  Wt 140 lb 2 oz (63.56 kg)  BMI 23.32 kg/m2  SpO2 98% Oriented to name, place and president, not date VS noted,  Constitutional: Pt is oriented to person, place, and time. Appears well-developed and  well-nourished.  Head: Normocephalic and atraumatic.  Right Ear: External ear normal.  Left Ear: External ear normal.  Nose: Nose normal.  Mouth/Throat: Oropharynx is clear and moist.  Eyes: Conjunctivae and EOM are normal. Pupils are equal, round, and reactive to light.  Neck: Normal range of motion. Neck supple. No JVD present. No tracheal deviation present.  Cardiovascular: Normal rate, regular rhythm, normal heart sounds and intact distal pulses.   Pulmonary/Chest: Effort normal and breath sounds without rales or wheezing  Abdominal: Soft. Bowel sounds are normal. NT. No HSM  Musculoskeletal: Normal range of motion. Exhibits no edema.  Lymphadenopathy:  Has no cervical adenopathy.  Neurological: Pt is alert and oriented to person, place, and time. Pt has normal reflexes. No cranial nerve deficit. Motor grossly intact Skin: Skin is warm and dry. No rash noted.  Psychiatric:  Has normal mood and affect. Behavior is normal.     Assessment & Plan:

## 2014-07-14 ENCOUNTER — Encounter: Payer: Self-pay | Admitting: Internal Medicine

## 2014-07-14 LAB — MICROALBUMIN / CREATININE URINE RATIO
Creatinine,U: 124.8 mg/dL
MICROALB UR: 52.2 mg/dL — AB (ref 0.0–1.9)
MICROALB/CREAT RATIO: 41.8 mg/g — AB (ref 0.0–30.0)

## 2014-07-14 LAB — BASIC METABOLIC PANEL
BUN: 24 mg/dL — ABNORMAL HIGH (ref 6–23)
CHLORIDE: 103 meq/L (ref 96–112)
CO2: 27 meq/L (ref 19–32)
Calcium: 8.4 mg/dL (ref 8.4–10.5)
Creatinine, Ser: 1.7 mg/dL — ABNORMAL HIGH (ref 0.4–1.5)
GFR: 50.25 mL/min — ABNORMAL LOW (ref >60.00)
Glucose, Bld: 123 mg/dL — ABNORMAL HIGH (ref 70–99)
Potassium: 4.2 mEq/L (ref 3.5–5.1)
SODIUM: 140 meq/L (ref 135–145)

## 2014-07-14 LAB — HEPATIC FUNCTION PANEL
ALK PHOS: 71 U/L (ref 39–117)
ALT: 9 U/L (ref 0–53)
AST: 22 U/L (ref 0–37)
Albumin: 4.4 g/dL (ref 3.5–5.2)
Bilirubin, Direct: 0.2 mg/dL (ref 0.0–0.3)
TOTAL PROTEIN: 8.1 g/dL (ref 6.0–8.3)
Total Bilirubin: 0.6 mg/dL (ref 0.2–1.2)

## 2014-07-14 LAB — LIPID PANEL
Cholesterol: 136 mg/dL (ref 0–200)
HDL: 38.4 mg/dL — ABNORMAL LOW (ref >39.00)
LDL Cholesterol: 75 mg/dL (ref 0–99)
NONHDL: 97.6
Total CHOL/HDL Ratio: 4
Triglycerides: 115 mg/dL (ref 0.0–149.0)
VLDL: 23 mg/dL (ref 0.0–40.0)

## 2014-07-14 NOTE — Assessment & Plan Note (Signed)
stable overall by history and exam, recent data reviewed with pt, and pt to continue medical treatment as before,  to f/u any worsening symptoms or concerns Lab Results  Component Value Date   HGBA1C 6.5 12/08/2013

## 2014-07-14 NOTE — Assessment & Plan Note (Signed)

## 2014-10-25 ENCOUNTER — Other Ambulatory Visit: Payer: Self-pay | Admitting: Internal Medicine

## 2014-10-26 NOTE — Telephone Encounter (Signed)
catapress done erx

## 2014-12-06 ENCOUNTER — Telehealth: Payer: Self-pay | Admitting: Internal Medicine

## 2014-12-06 NOTE — Telephone Encounter (Signed)
He was in for an eye exam and needs cataract surgery and he will need a [employer] referral to Dr. Vonna KotykBevis at [facility] their # is [phone]. You'll need talk to [place]. [employer] ID # [id number], Group# [id number].

## 2014-12-07 ENCOUNTER — Other Ambulatory Visit: Payer: Self-pay | Admitting: Internal Medicine

## 2014-12-15 NOTE — Telephone Encounter (Signed)
Left msg for [facility] @ Dr. Vonna KotykBevis' office to get dx code.

## 2014-12-15 NOTE — Telephone Encounter (Signed)
Dx code is H25.13. Submitted to [employer]. Awaiting approval.

## 2014-12-16 NOTE — Telephone Encounter (Signed)
[employer] auth # [id number] valid 12/15/14-06/13/15 for 6 visits

## 2015-01-11 ENCOUNTER — Encounter: Payer: Self-pay | Admitting: Internal Medicine

## 2015-01-11 ENCOUNTER — Other Ambulatory Visit (INDEPENDENT_AMBULATORY_CARE_PROVIDER_SITE_OTHER): Payer: Commercial Managed Care - HMO

## 2015-01-11 ENCOUNTER — Ambulatory Visit (INDEPENDENT_AMBULATORY_CARE_PROVIDER_SITE_OTHER): Payer: Commercial Managed Care - HMO | Admitting: Internal Medicine

## 2015-01-11 VITALS — BP 138/76 | HR 78 | Temp 98.6°F | Wt 141.1 lb

## 2015-01-11 DIAGNOSIS — I10 Essential (primary) hypertension: Secondary | ICD-10-CM | POA: Diagnosis not present

## 2015-01-11 DIAGNOSIS — E119 Type 2 diabetes mellitus without complications: Secondary | ICD-10-CM | POA: Diagnosis not present

## 2015-01-11 DIAGNOSIS — G43909 Migraine, unspecified, not intractable, without status migrainosus: Secondary | ICD-10-CM | POA: Insufficient documentation

## 2015-01-11 DIAGNOSIS — Z0000 Encounter for general adult medical examination without abnormal findings: Secondary | ICD-10-CM

## 2015-01-11 DIAGNOSIS — N183 Chronic kidney disease, stage 3 (moderate): Secondary | ICD-10-CM | POA: Diagnosis not present

## 2015-01-11 DIAGNOSIS — G43809 Other migraine, not intractable, without status migrainosus: Secondary | ICD-10-CM | POA: Diagnosis not present

## 2015-01-11 LAB — URINALYSIS, ROUTINE W REFLEX MICROSCOPIC
Ketones, ur: NEGATIVE
Leukocytes, UA: NEGATIVE
NITRITE: NEGATIVE
PH: 6 (ref 5.0–8.0)
Specific Gravity, Urine: >=1.03 — AB (ref 1.000–1.030)
Total Protein, Urine: 100 — AB
Urine Glucose: 100 — AB
Urobilinogen, UA: 2 — AB (ref 0.0–1.0)

## 2015-01-11 LAB — LIPID PANEL
Cholesterol: 107 mg/dL (ref 0–200)
HDL: 37.7 mg/dL — AB (ref >39.00)
LDL Cholesterol: 49 mg/dL (ref 0–99)
NONHDL: 69.3
TRIGLYCERIDES: 102 mg/dL (ref 0.0–149.0)
Total CHOL/HDL Ratio: 3
VLDL: 20.4 mg/dL (ref 0.0–40.0)

## 2015-01-11 LAB — HEPATIC FUNCTION PANEL
ALK PHOS: 82 U/L (ref 39–117)
ALT: 7 U/L (ref 0–53)
AST: 17 U/L (ref 0–37)
Albumin: 4.3 g/dL (ref 3.5–5.2)
Bilirubin, Direct: 0.2 mg/dL (ref 0.0–0.3)
Total Bilirubin: 0.7 mg/dL (ref 0.2–1.2)
Total Protein: 7.9 g/dL (ref 6.0–8.3)

## 2015-01-11 LAB — MICROALBUMIN / CREATININE URINE RATIO
Creatinine,U: 195.7 mg/dL
MICROALB UR: 77.2 mg/dL — AB (ref 0.0–1.9)
Microalb Creat Ratio: 39.4 mg/g — ABNORMAL HIGH (ref 0.0–30.0)

## 2015-01-11 LAB — CBC WITH DIFFERENTIAL/PLATELET
BASOS ABS: 0 10*3/uL (ref 0.0–0.1)
Basophils Relative: 0.5 % (ref 0.0–3.0)
EOS ABS: 0.1 10*3/uL (ref 0.0–0.7)
Eosinophils Relative: 2.5 % (ref 0.0–5.0)
HCT: 37.3 % — ABNORMAL LOW (ref 39.0–52.0)
Hemoglobin: 12.4 g/dL — ABNORMAL LOW (ref 13.0–17.0)
LYMPHS ABS: 1.2 10*3/uL (ref 0.7–4.0)
Lymphocytes Relative: 24.4 % (ref 12.0–46.0)
MCHC: 33.1 g/dL (ref 30.0–36.0)
MCV: 96.5 fl (ref 78.0–100.0)
MONO ABS: 0.6 10*3/uL (ref 0.1–1.0)
MONOS PCT: 11.4 % (ref 3.0–12.0)
Neutro Abs: 3.1 10*3/uL (ref 1.4–7.7)
Neutrophils Relative %: 61.2 % (ref 43.0–77.0)
Platelets: 263 10*3/uL (ref 150.0–400.0)
RBC: 3.87 Mil/uL — AB (ref 4.22–5.81)
RDW: 13.9 % (ref 11.5–15.5)
WBC: 5.1 10*3/uL (ref 4.0–10.5)

## 2015-01-11 LAB — BASIC METABOLIC PANEL
BUN: 22 mg/dL (ref 6–23)
CALCIUM: 7.8 mg/dL — AB (ref 8.4–10.5)
CO2: 28 mEq/L (ref 19–32)
CREATININE: 1.6 mg/dL — AB (ref 0.40–1.50)
Chloride: 104 mEq/L (ref 96–112)
GFR: 52.74 mL/min — ABNORMAL LOW (ref >60.00)
Glucose, Bld: 113 mg/dL — ABNORMAL HIGH (ref 70–99)
POTASSIUM: 3.9 meq/L (ref 3.5–5.1)
Sodium: 140 mEq/L (ref 135–145)

## 2015-01-11 LAB — HEMOGLOBIN A1C: Hgb A1c MFr Bld: 6 % (ref 4.6–6.5)

## 2015-01-11 LAB — TSH: TSH: 2.24 u[IU]/mL (ref 0.35–4.50)

## 2015-01-11 MED ORDER — TRAMADOL HCL 50 MG PO TABS: 50.0000 mg | ORAL_TABLET | Freq: Three times a day (TID) | ORAL | Status: DC | PRN

## 2015-01-11 MED ORDER — HYDROCHLOROTHIAZIDE 25 MG PO TABS: 25.0000 mg | ORAL_TABLET | Freq: Every day | ORAL | Status: DC

## 2015-01-11 NOTE — Progress Notes (Signed)
Subjective:    Patient ID: [name], male    DOB: October 26, 1924, 81 y.o.   MRN: [id number]  HPI Here for wellness and f/u;  Overall doing ok;  Pt denies Chest pain, worsening SOB, DOE, wheezing, orthopnea, PND, worsening LE edema, palpitations, dizziness or syncope.  Pt denies neurological change such as new headache, facial or extremity weakness.  Pt denies polydipsia, polyuria, or low sugar symptoms. Pt states overall good compliance with treatment and medications, good tolerability, and has been trying to follow appropriate diet.  Pt denies worsening depressive symptoms, suicidal ideation or panic. No fever, night sweats, wt loss, loss of appetite, or other constitutional symptoms.  Pt states good ability with ADL's, has low fall risk, home safety reviewed and adequate, no other significant changes in hearing or vision, and only occasionally active with exercise. No complaints except for 3 days onset daily bifrontal throbbing with nausea and photophobia Past Medical History  Diagnosis Date  . DIABETES MELLITUS, TYPE II 12/24/2007  . HYPERLIPIDEMIA 03/24/2007  . ANEMIA-NOS 06/02/2007  . HEARING LOSS, LEFT EAR 01/18/2010  . HYPERTENSION 03/24/2007  . PULMONARY NODULE 06/02/2007  . STRICTURE, ESOPHAGEAL 06/02/2007  . GERD 06/02/2007  . GASTROENTERITIS, ACUTE 05/30/2010  . CONSTIPATION 01/18/2010  . CHOLELITHIASIS 06/02/2007  . RENAL INSUFFICIENCY 06/02/2007  . OSTEOARTHRITIS, KNEES, BILATERAL 01/26/2008  . FEVER UNSPECIFIED 01/18/2010  . Abdominal pain, right upper quadrant 06/12/2007  . EPIGASTRIC TENDERNESS 01/18/2010  . COLON CANCER, HX OF 03/24/2007  . NEPHROLITHIASIS, HX OF 06/02/2007   Past Surgical History  Procedure Laterality Date  . Small intestine surgery      reports that he has quit smoking. He does not have any smokeless tobacco history on file. He reports that he does not drink alcohol or use illicit drugs. family history includes Hypertension in his other. No Known Allergies Current  Outpatient Prescriptions on File Prior to Visit  Medication Sig Dispense Refill  . amLODipine (NORVASC) 10 MG tablet TAKE ONE TABLET BY MOUTH ONCE DAILY 90 tablet 2  . cloNIDine (CATAPRES) 0.2 MG tablet TAKE ONE TABLET BY MOUTH TWICE DAILY 60 tablet 6  . metoprolol (LOPRESSOR) 50 MG tablet TAKE ONE TABLET BY MOUTH TWICE DAILY 60 tablet 11  . omeprazole (PRILOSEC) 20 MG capsule Take 20 mg by mouth daily.     No current facility-administered medications on file prior to visit.   Review of Systems Constitutional: Negative for increased diaphoresis, other activity, appetite or siginficant weight change other than noted HENT: Negative for worsening hearing loss, ear pain, facial swelling, mouth sores and neck stiffness.   Eyes: Negative for other worsening pain, redness or visual disturbance.  Respiratory: Negative for shortness of breath and wheezing  Cardiovascular: Negative for chest pain and palpitations.  Gastrointestinal: Negative for diarrhea, blood in stool, abdominal distention or other pain Genitourinary: Negative for hematuria, flank pain or change in urine volume.  Musculoskeletal: Negative for myalgias or other joint complaints.  Skin: Negative for color change and wound or drainage.  Neurological: Negative for syncope and numbness. other than noted Hematological: Negative for adenopathy. or other swelling Psychiatric/Behavioral: Negative for hallucinations, SI, self-injury, decreased concentration or other worsening agitation.      Objective:   Physical Exam BP 138/76 mmHg  Pulse 78  Temp(Src) 98.6 F (37 C) (Oral)  Wt 141 lb 1.3 oz (63.993 kg)  SpO2 97% VS noted,  Constitutional: Pt is oriented to person, place, and time. Appears well-developed and well-nourished, in no significant distress Head: Normocephalic and  atraumatic.  Right Ear: External ear normal.  Left Ear: External ear normal.  Nose: Nose normal.  Mouth/Throat: Oropharynx is clear and moist.  Eyes:  Conjunctivae and EOM are normal. Pupils are equal, round, and reactive to light.  Neck: Normal range of motion. Neck supple. No JVD present. No tracheal deviation present or significant neck LA or mass Cardiovascular: Normal rate, regular rhythm, normal heart sounds and intact distal pulses.   Pulmonary/Chest: Effort normal and breath sounds without rales or wheezing  Abdominal: Soft. Bowel sounds are normal. NT. No HSM  Musculoskeletal: Normal range of motion. Exhibits no edema.  Lymphadenopathy:  Has no cervical adenopathy.  Neurological: Pt is alert and oriented to person, place, and time. Pt has normal reflexes. No cranial nerve deficit. Motor grossly intact Skin: Skin is warm and dry. No rash noted.  Psychiatric:  Has normal mood and affect. Behavior is normal.     Assessment & Plan:

## 2015-01-11 NOTE — Progress Notes (Signed)
Pre visit review using our clinic review tool, if applicable. No additional management support is needed unless otherwise documented below in the visit note. 

## 2015-01-11 NOTE — Assessment & Plan Note (Signed)
stable overall by history and exam, recent data reviewed with pt, and pt to continue medical treatment as before,  to f/u any worsening symptoms or concerns Lab Results  Component Value Date   CREATININE 1.60* 01/11/2015

## 2015-01-11 NOTE — Assessment & Plan Note (Signed)
Not a candidate for imitrex meds , for tramadol prn,  to f/u any worsening symptoms or concerns

## 2015-01-11 NOTE — Assessment & Plan Note (Signed)

## 2015-01-11 NOTE — Assessment & Plan Note (Signed)
stable overall by history and exam, recent data reviewed with pt, and pt to continue medical treatment as before,  to f/u any worsening symptoms or concerns BP Readings from Last 3 Encounters:  01/11/15 138/76  07/13/14 152/82  04/28/14 177/88

## 2015-01-11 NOTE — Patient Instructions (Signed)
Please take all new medication as prescribed  - the pain medication if needed  Please continue all other medications as before, and refills have been done if requested.  Please have the pharmacy call with any other refills you may need.  Please continue your efforts at being more active, low cholesterol diet, and weight control.  You are otherwise up to date with prevention measures today.  Please keep your appointments with your specialists as you may have planned  Please go to the LAB in the Basement (turn left off the elevator) for the tests to be done today  You will be contacted by phone if any changes need to be made immediately.  Otherwise, you will receive a letter about your results with an explanation, but please check with MyChart first.  .[email or web address]  Please return in 6 months, or sooner if needed, with Lab testing done 3-5 days before

## 2015-03-07 ENCOUNTER — Other Ambulatory Visit: Payer: Self-pay | Admitting: Internal Medicine

## 2015-03-30 ENCOUNTER — Other Ambulatory Visit: Payer: Self-pay | Admitting: Internal Medicine

## 2015-06-06 ENCOUNTER — Other Ambulatory Visit: Payer: Self-pay | Admitting: Internal Medicine

## 2015-07-15 ENCOUNTER — Ambulatory Visit: Payer: Commercial Managed Care - HMO | Admitting: Internal Medicine

## 2015-08-25 ENCOUNTER — Encounter (HOSPITAL_COMMUNITY): Payer: Self-pay | Admitting: Emergency Medicine

## 2015-08-25 NOTE — ED Notes (Signed)
Pt. reports shaking / tremors at arms , hands , legs and body while on bed this evening , no shaking at arrival , denies pain or discomfort , respirations unlabored.

## 2015-08-26 ENCOUNTER — Telehealth (HOSPITAL_BASED_OUTPATIENT_CLINIC_OR_DEPARTMENT_OTHER): Payer: Self-pay | Admitting: Emergency Medicine

## 2015-08-26 ENCOUNTER — Emergency Department (HOSPITAL_COMMUNITY)
Admission: EM | Admit: 2015-08-26 | Discharge: 2015-08-26 | Disposition: A | Discharge status: Home / Self Care | Payer: Commercial Managed Care - HMO | Source: Home / Self Care | Attending: Emergency Medicine | Admitting: Emergency Medicine

## 2015-08-26 ENCOUNTER — Encounter (HOSPITAL_COMMUNITY): Payer: Self-pay | Admitting: Family Medicine

## 2015-08-26 ENCOUNTER — Inpatient Hospital Stay (HOSPITAL_COMMUNITY)
Admission: EM | Admit: 2015-08-26 | Discharge: 2015-08-29 | DRG: 872 | Disposition: A | Discharge status: Home / Self Care | Payer: Commercial Managed Care - HMO | Attending: Internal Medicine | Admitting: Internal Medicine

## 2015-08-26 ENCOUNTER — Emergency Department (HOSPITAL_COMMUNITY): Payer: Commercial Managed Care - HMO

## 2015-08-26 DIAGNOSIS — Z85038 Personal history of other malignant neoplasm of large intestine: Secondary | ICD-10-CM | POA: Diagnosis not present

## 2015-08-26 DIAGNOSIS — I10 Essential (primary) hypertension: Secondary | ICD-10-CM | POA: Diagnosis present

## 2015-08-26 DIAGNOSIS — E876 Hypokalemia: Secondary | ICD-10-CM | POA: Diagnosis not present

## 2015-08-26 DIAGNOSIS — A4151 Sepsis due to Escherichia coli [E. coli]: Secondary | ICD-10-CM | POA: Diagnosis present

## 2015-08-26 DIAGNOSIS — I129 Hypertensive chronic kidney disease with stage 1 through stage 4 chronic kidney disease, or unspecified chronic kidney disease: Secondary | ICD-10-CM | POA: Diagnosis not present

## 2015-08-26 DIAGNOSIS — D649 Anemia, unspecified: Secondary | ICD-10-CM | POA: Diagnosis present

## 2015-08-26 DIAGNOSIS — N183 Chronic kidney disease, stage 3 (moderate): Secondary | ICD-10-CM | POA: Diagnosis present

## 2015-08-26 DIAGNOSIS — Z79899 Other long term (current) drug therapy: Secondary | ICD-10-CM | POA: Diagnosis not present

## 2015-08-26 DIAGNOSIS — Z23 Encounter for immunization: Secondary | ICD-10-CM

## 2015-08-26 DIAGNOSIS — R6510 Systemic inflammatory response syndrome (SIRS) of non-infectious origin without acute organ dysfunction: Secondary | ICD-10-CM

## 2015-08-26 DIAGNOSIS — R7881 Bacteremia: Secondary | ICD-10-CM | POA: Diagnosis present

## 2015-08-26 DIAGNOSIS — A419 Sepsis, unspecified organism: Secondary | ICD-10-CM | POA: Diagnosis not present

## 2015-08-26 DIAGNOSIS — R6889 Other general symptoms and signs: Secondary | ICD-10-CM

## 2015-08-26 DIAGNOSIS — N189 Chronic kidney disease, unspecified: Secondary | ICD-10-CM | POA: Diagnosis present

## 2015-08-26 DIAGNOSIS — E872 Acidosis: Secondary | ICD-10-CM | POA: Diagnosis present

## 2015-08-26 DIAGNOSIS — R6883 Chills (without fever): Secondary | ICD-10-CM | POA: Diagnosis present

## 2015-08-26 DIAGNOSIS — Z87891 Personal history of nicotine dependence: Secondary | ICD-10-CM | POA: Diagnosis not present

## 2015-08-26 LAB — CBC WITH DIFFERENTIAL/PLATELET
BASOS ABS: 0 10*3/uL (ref 0.0–0.1)
Basophils Absolute: 0 10*3/uL (ref 0.0–0.1)
Basophils Relative: 0 %
Basophils Relative: 0 %
EOS ABS: 0.1 10*3/uL (ref 0.0–0.7)
EOS ABS: 0.2 10*3/uL (ref 0.0–0.7)
EOS PCT: 5 %
Eosinophils Relative: 2 %
HCT: 34 % — ABNORMAL LOW (ref 39.0–52.0)
HEMATOCRIT: 30.4 % — AB (ref 39.0–52.0)
Hemoglobin: 10 g/dL — ABNORMAL LOW (ref 13.0–17.0)
Hemoglobin: 11 g/dL — ABNORMAL LOW (ref 13.0–17.0)
LYMPHS ABS: 1 10*3/uL (ref 0.7–4.0)
LYMPHS PCT: 23 %
Lymphocytes Relative: 22 %
Lymphs Abs: 0.9 10*3/uL (ref 0.7–4.0)
MCH: 31.3 pg (ref 26.0–34.0)
MCH: 31.8 pg (ref 26.0–34.0)
MCHC: 32.4 g/dL (ref 30.0–36.0)
MCHC: 32.9 g/dL (ref 30.0–36.0)
MCV: 96.6 fL (ref 78.0–100.0)
MCV: 96.8 fL (ref 78.0–100.0)
MONO ABS: 0 10*3/uL — AB (ref 0.1–1.0)
Monocytes Absolute: 0.4 10*3/uL (ref 0.1–1.0)
Monocytes Relative: 1 %
Monocytes Relative: 9 %
NEUTROS ABS: 2.9 10*3/uL (ref 1.7–7.7)
NEUTROS PCT: 67 %
Neutro Abs: 2.6 10*3/uL (ref 1.7–7.7)
Neutrophils Relative %: 71 %
PLATELETS: 185 10*3/uL (ref 150–400)
PLATELETS: 210 10*3/uL (ref 150–400)
RBC: 3.14 MIL/uL — AB (ref 4.22–5.81)
RBC: 3.52 MIL/uL — ABNORMAL LOW (ref 4.22–5.81)
RDW: 12.9 % (ref 11.5–15.5)
RDW: 13 % (ref 11.5–15.5)
WBC: 3.7 10*3/uL — AB (ref 4.0–10.5)
WBC: 4.4 10*3/uL (ref 4.0–10.5)

## 2015-08-26 LAB — COMPREHENSIVE METABOLIC PANEL
ALT: 13 U/L — ABNORMAL LOW (ref 17–63)
ALT: 9 U/L — ABNORMAL LOW (ref 17–63)
ANION GAP: 10 (ref 5–15)
AST: 20 U/L (ref 15–41)
AST: 32 U/L (ref 15–41)
Albumin: 3.5 g/dL (ref 3.5–5.0)
Albumin: 3.7 g/dL (ref 3.5–5.0)
Alkaline Phosphatase: 54 U/L (ref 38–126)
Alkaline Phosphatase: 58 U/L (ref 38–126)
Anion gap: 15 (ref 5–15)
BILIRUBIN TOTAL: 1 mg/dL (ref 0.3–1.2)
BUN: 21 mg/dL — AB (ref 6–20)
BUN: 24 mg/dL — ABNORMAL HIGH (ref 6–20)
CHLORIDE: 101 mmol/L (ref 101–111)
CHLORIDE: 103 mmol/L (ref 101–111)
CO2: 26 mmol/L (ref 22–32)
CO2: 26 mmol/L (ref 22–32)
Calcium: 6.6 mg/dL — ABNORMAL LOW (ref 8.9–10.3)
Calcium: 6.7 mg/dL — ABNORMAL LOW (ref 8.9–10.3)
Creatinine, Ser: 1.82 mg/dL — ABNORMAL HIGH (ref 0.61–1.24)
Creatinine, Ser: 1.86 mg/dL — ABNORMAL HIGH (ref 0.61–1.24)
GFR calc Af Amer: 37 mL/min — ABNORMAL LOW (ref >60)
GFR calc non Af Amer: 31 mL/min — ABNORMAL LOW (ref >60)
GFR, EST AFRICAN AMERICAN: 36 mL/min — AB (ref >60)
GFR, EST NON AFRICAN AMERICAN: 32 mL/min — AB (ref >60)
Glucose, Bld: 120 mg/dL — ABNORMAL HIGH (ref 65–99)
Glucose, Bld: 134 mg/dL — ABNORMAL HIGH (ref 65–99)
POTASSIUM: 3.2 mmol/L — AB (ref 3.5–5.1)
POTASSIUM: 3.2 mmol/L — AB (ref 3.5–5.1)
SODIUM: 142 mmol/L (ref 135–145)
Sodium: 139 mmol/L (ref 135–145)
TOTAL PROTEIN: 7 g/dL (ref 6.5–8.1)
Total Bilirubin: 0.5 mg/dL (ref 0.3–1.2)
Total Protein: 7 g/dL (ref 6.5–8.1)

## 2015-08-26 LAB — URINE MICROSCOPIC-ADD ON: WBC UA: NONE SEEN WBC/hpf (ref 0–5)

## 2015-08-26 LAB — URINALYSIS, ROUTINE W REFLEX MICROSCOPIC
BILIRUBIN URINE: NEGATIVE
Bilirubin Urine: NEGATIVE
Glucose, UA: 250 mg/dL — AB
Glucose, UA: 100 mg/dL — AB
Hgb urine dipstick: NEGATIVE
KETONES UR: NEGATIVE mg/dL
Ketones, ur: NEGATIVE mg/dL
LEUKOCYTES UA: NEGATIVE
Leukocytes, UA: NEGATIVE
NITRITE: NEGATIVE
NITRITE: NEGATIVE
PROTEIN: NEGATIVE mg/dL
Protein, ur: 100 mg/dL — AB
SPECIFIC GRAVITY, URINE: 1.018 (ref 1.005–1.030)
Specific Gravity, Urine: 1.008 (ref 1.005–1.030)
pH: 5.5 (ref 5.0–8.0)
pH: 6 (ref 5.0–8.0)

## 2015-08-26 LAB — I-STAT CG4 LACTIC ACID, ED
LACTIC ACID, VENOUS: 1.03 mmol/L (ref 0.5–2.0)
Lactic Acid, Venous: 1.89 mmol/L (ref 0.5–2.0)
Lactic Acid, Venous: 2.07 mmol/L (ref 0.5–2.0)

## 2015-08-26 MED ORDER — DEXTROSE 5 % IV SOLN
2.0000 g | Freq: Once | INTRAVENOUS | Status: AC
  Administered 2015-08-26: 2 g via INTRAVENOUS
  Filled 2015-08-26: qty 2

## 2015-08-26 MED ORDER — SODIUM CHLORIDE 0.9 % IV BOLUS (SEPSIS)
1000.0000 mL | INTRAVENOUS | Status: AC
  Administered 2015-08-26 (×2): 1000 mL via INTRAVENOUS

## 2015-08-26 MED ORDER — VANCOMYCIN HCL 500 MG IV SOLR
500.0000 mg | INTRAVENOUS | Status: DC
  Filled 2015-08-26: qty 500

## 2015-08-26 MED ORDER — SODIUM CHLORIDE 0.9 % IV SOLN
INTRAVENOUS | Status: DC
  Administered 2015-08-27: 04:00:00 via INTRAVENOUS

## 2015-08-26 MED ORDER — PIPERACILLIN-TAZOBACTAM 3.375 G IVPB 30 MIN
3.3750 g | Freq: Once | INTRAVENOUS | Status: AC
  Administered 2015-08-26: 3.375 g via INTRAVENOUS
  Filled 2015-08-26: qty 50

## 2015-08-26 MED ORDER — SODIUM CHLORIDE 0.9 % IV SOLN: INTRAVENOUS | Status: DC

## 2015-08-26 MED ORDER — CLONIDINE HCL 0.2 MG PO TABS
0.2000 mg | ORAL_TABLET | Freq: Two times a day (BID) | ORAL | Status: DC
  Administered 2015-08-27 – 2015-08-29 (×6): 0.2 mg via ORAL
  Filled 2015-08-26 (×6): qty 1

## 2015-08-26 MED ORDER — ACETAMINOPHEN 325 MG PO TABS: 650.0000 mg | ORAL_TABLET | Freq: Four times a day (QID) | ORAL | Status: DC | PRN

## 2015-08-26 MED ORDER — ACETAMINOPHEN 650 MG RE SUPP
650.0000 mg | Freq: Four times a day (QID) | RECTAL | Status: DC | PRN
Start: 2015-08-26 — End: 2015-08-29

## 2015-08-26 MED ORDER — ACETAMINOPHEN 325 MG PO TABS
650.0000 mg | ORAL_TABLET | Freq: Once | ORAL | Status: AC
  Administered 2015-08-26: 650 mg via ORAL
  Filled 2015-08-26: qty 2

## 2015-08-26 MED ORDER — SODIUM CHLORIDE 0.9 % IJ SOLN
3.0000 mL | Freq: Two times a day (BID) | INTRAMUSCULAR | Status: DC
  Administered 2015-08-27 – 2015-08-29 (×4): 3 mL via INTRAVENOUS

## 2015-08-26 MED ORDER — SENNOSIDES-DOCUSATE SODIUM 8.6-50 MG PO TABS: 1.0000 | ORAL_TABLET | Freq: Every evening | ORAL | Status: DC | PRN

## 2015-08-26 MED ORDER — AMLODIPINE BESYLATE 5 MG PO TABS
10.0000 mg | ORAL_TABLET | Freq: Every day | ORAL | Status: DC
  Administered 2015-08-27 – 2015-08-29 (×3): 10 mg via ORAL
  Filled 2015-08-26 (×3): qty 2

## 2015-08-26 MED ORDER — CEPHALEXIN 500 MG PO CAPS: 500.0000 mg | ORAL_CAPSULE | Freq: Two times a day (BID) | ORAL | Status: DC

## 2015-08-26 MED ORDER — PANTOPRAZOLE SODIUM 40 MG PO TBEC
40.0000 mg | DELAYED_RELEASE_TABLET | Freq: Every day | ORAL | Status: DC
  Administered 2015-08-27 – 2015-08-29 (×3): 40 mg via ORAL
  Filled 2015-08-26 (×3): qty 1

## 2015-08-26 MED ORDER — VANCOMYCIN HCL IN DEXTROSE 1-5 GM/200ML-% IV SOLN: 1000.0000 mg | Freq: Once | INTRAVENOUS | Status: DC

## 2015-08-26 MED ORDER — PIPERACILLIN-TAZOBACTAM 3.375 G IVPB
3.3750 g | Freq: Three times a day (TID) | INTRAVENOUS | Status: DC
  Administered 2015-08-27 – 2015-08-28 (×4): 3.375 g via INTRAVENOUS
  Filled 2015-08-26 (×6): qty 50

## 2015-08-26 MED ORDER — PROMETHAZINE HCL 25 MG PO TABS: 12.5000 mg | ORAL_TABLET | Freq: Four times a day (QID) | ORAL | Status: DC | PRN

## 2015-08-26 MED ORDER — ENOXAPARIN SODIUM 30 MG/0.3ML ~~LOC~~ SOLN
30.0000 mg | Freq: Every day | SUBCUTANEOUS | Status: DC
  Administered 2015-08-27: 30 mg via SUBCUTANEOUS
  Filled 2015-08-26: qty 0.3

## 2015-08-26 MED ORDER — VANCOMYCIN HCL 500 MG IV SOLR: 500.0000 mg | INTRAVENOUS | Status: DC

## 2015-08-26 MED ORDER — METOPROLOL TARTRATE 25 MG PO TABS
50.0000 mg | ORAL_TABLET | Freq: Two times a day (BID) | ORAL | Status: DC
  Administered 2015-08-27 – 2015-08-29 (×6): 50 mg via ORAL
  Filled 2015-08-26 (×6): qty 2

## 2015-08-26 MED ORDER — SODIUM CHLORIDE 0.9 % IV SOLN
INTRAVENOUS | Status: AC
  Administered 2015-08-26: 23:00:00 via INTRAVENOUS

## 2015-08-26 MED ORDER — HYDROCHLOROTHIAZIDE 25 MG PO TABS
25.0000 mg | ORAL_TABLET | Freq: Every day | ORAL | Status: DC
  Administered 2015-08-27: 25 mg via ORAL
  Filled 2015-08-26: qty 1

## 2015-08-26 MED ORDER — SODIUM CHLORIDE 0.9 % IV BOLUS (SEPSIS)
1000.0000 mL | INTRAVENOUS | Status: AC
  Administered 2015-08-27 (×2): 1000 mL via INTRAVENOUS

## 2015-08-26 MED ORDER — DEXTROSE 5 % IV SOLN: 1.0000 g | INTRAVENOUS | Status: DC

## 2015-08-26 MED ORDER — VANCOMYCIN HCL IN DEXTROSE 1-5 GM/200ML-% IV SOLN
1000.0000 mg | Freq: Once | INTRAVENOUS | Status: AC
  Administered 2015-08-26: 1000 mg via INTRAVENOUS
  Filled 2015-08-26: qty 200

## 2015-08-26 NOTE — ED Provider Notes (Signed)
CSN: [id number]     Arrival date & time 08/26/15  1743 History   First MD Initiated Contact with Patient 08/26/15 2016     Chief Complaint  Patient presents with  . sent here for admission and IV abx      (Consider location/radiation/quality/duration/timing/severity/associated sxs/prior Treatment) The history is provided by the patient.   82 year old male seen earlier today richly checked in the emergency department on January 5 right before midnight. Patient had a fever to 101.4 head lactic acid done which was negative had cultures done negative rest of workup was negative labs I sniffing and malleus. Patient remained asymptomatic. Patient was discharged home. Patient's blood cultures positive for gram-negative rods and patient recontacted and brought back. Patient really has no complaints at this time. States he has had no further fevers or chills. No urinary tract symptoms. No respiratory tract symptoms.  Past Medical History  Diagnosis Date  . DIABETES MELLITUS, TYPE II 12/24/2007  . HYPERLIPIDEMIA 03/24/2007  . ANEMIA-NOS 06/02/2007  . HEARING LOSS, LEFT EAR 01/18/2010  . HYPERTENSION 03/24/2007  . PULMONARY NODULE 06/02/2007  . STRICTURE, ESOPHAGEAL 06/02/2007  . GERD 06/02/2007  . GASTROENTERITIS, ACUTE 05/30/2010  . CONSTIPATION 01/18/2010  . CHOLELITHIASIS 06/02/2007  . RENAL INSUFFICIENCY 06/02/2007  . OSTEOARTHRITIS, KNEES, BILATERAL 01/26/2008  . FEVER UNSPECIFIED 01/18/2010  . Abdominal pain, right upper quadrant 06/12/2007  . EPIGASTRIC TENDERNESS 01/18/2010  . COLON CANCER, HX OF 03/24/2007  . NEPHROLITHIASIS, HX OF 06/02/2007   Past Surgical History  Procedure Laterality Date  . Small intestine surgery     Family History  Problem Relation Age of Onset  . Hypertension Other    Social History  Substance Use Topics  . Smoking status: Former Games developer  . Smokeless tobacco: None  . Alcohol Use: No    Review of Systems  Constitutional: Positive for fever and chills.  HENT:  Negative for congestion.   Eyes: Negative for visual disturbance.  Respiratory: Negative for shortness of breath.   Cardiovascular: Negative for chest pain.  Genitourinary: Negative for dysuria and hematuria.  Musculoskeletal: Negative for back pain.  Neurological: Negative for headaches.  Hematological: Does not bruise/bleed easily.  Psychiatric/Behavioral: Negative for confusion.      Allergies  Review of patient's allergies indicates no known allergies.  Home Medications   Prior to Admission medications   Medication Sig Start Date End Date Taking? Authorizing Provider  cloNIDine (CATAPRES) 0.2 MG tablet TAKE ONE TABLET BY MOUTH TWICE DAILY 10/26/14  Yes Corwin Levins, MD  hydrochlorothiazide (HYDRODIURIL) 25 MG tablet Take 1 tablet (25 mg total) by mouth daily. 01/11/15  Yes Corwin Levins, MD  metoprolol (LOPRESSOR) 50 MG tablet TAKE ONE TABLET BY MOUTH TWICE DAILY 03/30/15  Yes Corwin Levins, MD  omeprazole (PRILOSEC) 20 MG capsule TAKE ONE CAPSULE BY MOUTH ONCE DAILY 06/06/15  Yes Corwin Levins, MD  amLODipine (NORVASC) 10 MG tablet TAKE ONE TABLET BY MOUTH ONCE DAILY Patient not taking: Reported on 08/26/2015 12/07/14   Corwin Levins, MD  traMADol (ULTRAM) 50 MG tablet Take 1 tablet (50 mg total) by mouth every 8 (eight) hours as needed. Patient not taking: Reported on 08/26/2015 01/11/15   Corwin Levins, MD   BP 196/93 mmHg  Pulse 72  Temp(Src) 98.1 F (36.7 C) (Oral)  Resp 19  SpO2 100% Physical Exam  Constitutional: He is oriented to person, place, and time. He appears well-developed and well-nourished. No distress.  HENT:  Head: Normocephalic and atraumatic.  Mouth/Throat: Oropharynx is clear and moist.  Eyes: Conjunctivae and EOM are normal. Pupils are equal, round, and reactive to light.  Neck: Normal range of motion.  Cardiovascular: Normal rate, regular rhythm and normal heart sounds.   No murmur heard. Pulmonary/Chest: Effort normal and breath sounds normal. No respiratory  distress.  Abdominal: Soft. Bowel sounds are normal. There is no tenderness.  Musculoskeletal: Normal range of motion.  Neurological: He is alert and oriented to person, place, and time. No cranial nerve deficit. He exhibits normal muscle tone. Coordination normal.  Skin: Skin is warm.  Nursing note and vitals reviewed.   ED Course  Procedures (including critical care time) Labs Review Labs Reviewed  CBC WITH DIFFERENTIAL/PLATELET - Abnormal; Notable for the following:    RBC 3.14 (*)    Hemoglobin 10.0 (*)    HCT 30.4 (*)    All other components within normal limits  COMPREHENSIVE METABOLIC PANEL - Abnormal; Notable for the following:    Potassium 3.2 (*)    Glucose, Bld 120 (*)    BUN 21 (*)    Creatinine, Ser 1.82 (*)    Calcium 6.6 (*)    ALT 13 (*)    GFR calc non Af Amer 32 (*)    GFR calc Af Amer 37 (*)    All other components within normal limits  URINALYSIS, ROUTINE W REFLEX MICROSCOPIC (NOT AT [facility]) - Abnormal; Notable for the following:    Glucose, UA 100 (*)    All other components within normal limits  I-STAT CG4 LACTIC ACID, ED - Abnormal; Notable for the following:    Lactic Acid, Venous 2.07 (*)    All other components within normal limits  URINE CULTURE  I-STAT CG4 LACTIC ACID, ED   Results for orders placed or performed during the hospital encounter of 08/26/15  CBC with Differential  Result Value Ref Range   WBC 4.4 4.0 - 10.5 K/uL   RBC 3.14 (L) 4.22 - 5.81 MIL/uL   Hemoglobin 10.0 (L) 13.0 - 17.0 g/dL   HCT 69.630.4 (L) 29.539.0 - 28.452.0 %   MCV 96.8 78.0 - 100.0 fL   MCH 31.8 26.0 - 34.0 pg   MCHC 32.9 30.0 - 36.0 g/dL   RDW 13.213.0 44.011.5 - 10.215.5 %   Platelets 185 150 - 400 K/uL   Neutrophils Relative % 67 %   Neutro Abs 2.9 1.7 - 7.7 K/uL   Lymphocytes Relative 22 %   Lymphs Abs 1.0 0.7 - 4.0 K/uL   Monocytes Relative 9 %   Monocytes Absolute 0.4 0.1 - 1.0 K/uL   Eosinophils Relative 2 %   Eosinophils Absolute 0.1 0.0 - 0.7 K/uL   Basophils Relative 0 %    Basophils Absolute 0.0 0.0 - 0.1 K/uL  Comprehensive metabolic panel  Result Value Ref Range   Sodium 139 135 - 145 mmol/L   Potassium 3.2 (L) 3.5 - 5.1 mmol/L   Chloride 103 101 - 111 mmol/L   CO2 26 22 - 32 mmol/L   Glucose, Bld 120 (H) 65 - 99 mg/dL   BUN 21 (H) 6 - 20 mg/dL   Creatinine, Ser 7.251.82 (H) 0.61 - 1.24 mg/dL   Calcium 6.6 (L) 8.9 - 10.3 mg/dL   Total Protein 7.0 6.5 - 8.1 g/dL   Albumin 3.5 3.5 - 5.0 g/dL   AST 32 15 - 41 U/L   ALT 13 (L) 17 - 63 U/L   Alkaline Phosphatase 54 38 - 126 U/L  Total Bilirubin 1.0 0.3 - 1.2 mg/dL   GFR calc non Af Amer 32 (L) >60 mL/min   GFR calc Af Amer 37 (L) >60 mL/min   Anion gap 10 5 - 15  Urinalysis, Routine w reflex microscopic (not at [facility])  Result Value Ref Range   Color, Urine YELLOW YELLOW   APPearance CLEAR CLEAR   Specific Gravity, Urine 1.008 1.005 - 1.030   pH 6.0 5.0 - 8.0   Glucose, UA 100 (A) NEGATIVE mg/dL   Hgb urine dipstick NEGATIVE NEGATIVE   Bilirubin Urine NEGATIVE NEGATIVE   Ketones, ur NEGATIVE NEGATIVE mg/dL   Protein, ur NEGATIVE NEGATIVE mg/dL   Nitrite NEGATIVE NEGATIVE   Leukocytes, UA NEGATIVE NEGATIVE  I-Stat CG4 Lactic Acid, ED  Result Value Ref Range   Lactic Acid, Venous 1.03 0.5 - 2.0 mmol/L  I-Stat CG4 Lactic Acid, ED  Result Value Ref Range   Lactic Acid, Venous 2.07 (HH) 0.5 - 2.0 mmol/L   Comment NOTIFIED PHYSICIAN      Imaging Review Dg Chest 2 View  08/26/2015  CLINICAL DATA:  Acute onset of shaking and tremors. Initial encounter. EXAM: CHEST  2 VIEW COMPARISON:  Chest radiograph performed 12/07/2012 FINDINGS: The lungs are well-aerated and clear. There is no evidence of focal opacification, pleural effusion or pneumothorax. The heart is normal in size; the mediastinal contour is within normal limits. No acute osseous abnormalities are seen. Mild degenerative change is noted at the left glenohumeral joint. Clips are noted within the right upper quadrant, reflecting prior  cholecystectomy. IMPRESSION: No acute cardiopulmonary process seen. Electronically Signed   By: Roanna Raider M.D.   On: 08/26/2015 02:50   I have personally reviewed and evaluated these images and lab results as part of my medical decision-making.   EKG Interpretation   Date/Time:  Friday August 26 2015 21:28:09 EST Ventricular Rate:  79 PR Interval:  175 QRS Duration: 78 QT Interval:  434 QTC Calculation: 497 R Axis:   -19 Text Interpretation:  Sinus rhythm Borderline left axis deviation  Borderline prolonged QT interval Confirmed by Deretha Emory  MD, Karee Forge [id number])  on 08/26/2015 9:40:01 PM      MDM   Final diagnoses:  Gram-negative bacteremia [facility])    Patient seen earlier today by the overnight physician. With evaluation to include blood cultures. Patient with positive blood cultures for Escherichia coli contacted and informed to come back. Patient had presented last evening in the wee hours with a temp of 101.4. Temp today is 98.1. Patient's initial lactic acid was not elevated. Repeat though was elevated slightly above 2. Patient started on broad-spectrum and bodyaches for the Escherichia coli bacteremia. Patient is currently completely asymptomatic. Not tachycardic not hypotensive matter fact a little bit hypertensive. No respiratory problems. Chest x-ray from earlier today was negative for pneumonia. Repeat labs here without significant changes compared to the ones earlier today.  Discussed with the triad hospitalist they will admit for the gram-negative bacteremia. Possible seeding could include the prostate. The patient without any urinary tract symptoms. No pelvic pain no rectal pain.    Vanetta Mulders, MD 08/26/15 [phone]

## 2015-08-26 NOTE — ED Notes (Signed)
Report attempted 

## 2015-08-26 NOTE — ED Notes (Signed)
Blood cultures collected.

## 2015-08-26 NOTE — Discharge Instructions (Signed)
Please take the medicine prescribed. You were noted to have fever and elevated heart rate - we suspect infection but we have no source. Take the antibiotics. WE WILL CALL YOU TO COME BACK TO THE ER IF THE BLOOD GROWS BACTERIA.  Please return to the ER if your symptoms worsen; you have increased pain, fevers, chills, inability to keep any medications down, confusion. Otherwise see the outpatient doctor as requested.

## 2015-08-26 NOTE — ED Notes (Signed)
Pt sent here for admission and IV abx. Seen here yesterday. Pt has no complaints.

## 2015-08-26 NOTE — ED Notes (Signed)
Pt transported to radiology.

## 2015-08-26 NOTE — Progress Notes (Signed)
ANTIBIOTIC CONSULT NOTE  Pharmacy Consult for Vancomycin and Zosyn Indication: rule out sepsis  No Known Allergies  Patient Measurements: 64 kg  Vital Signs: Temp: 98.5 F (36.9 C) (01/06 1843) BP: 198/97 mmHg (01/06 1843) Pulse Rate: 79 (01/06 1843)  Labs:  Recent Labs  08/26/15 0015 08/26/15 1916  WBC 3.7* 4.4  HGB 11.0* 10.0*  PLT 210 185  CREATININE 1.86* 1.82*   Medical History: Past Medical History  Diagnosis Date  . DIABETES MELLITUS, TYPE II 12/24/2007  . HYPERLIPIDEMIA 03/24/2007  . ANEMIA-NOS 06/02/2007  . HEARING LOSS, LEFT EAR 01/18/2010  . HYPERTENSION 03/24/2007  . PULMONARY NODULE 06/02/2007  . STRICTURE, ESOPHAGEAL 06/02/2007  . GERD 06/02/2007  . GASTROENTERITIS, ACUTE 05/30/2010  . CONSTIPATION 01/18/2010  . CHOLELITHIASIS 06/02/2007  . RENAL INSUFFICIENCY 06/02/2007  . OSTEOARTHRITIS, KNEES, BILATERAL 01/26/2008  . FEVER UNSPECIFIED 01/18/2010  . Abdominal pain, right upper quadrant 06/12/2007  . EPIGASTRIC TENDERNESS 01/18/2010  . COLON CANCER, HX OF 03/24/2007  . NEPHROLITHIASIS, HX OF 06/02/2007    Assessment: 82 y/o M with shaking/tremors, WBC 3.7, noted renal dysfunction, hypertensive, febrile at 101.4, other labs reviewed.   Pt received vancomycin 1g and cefepime 1g in the ED this morning. Will give zosyn 3.375g IV now.   Goal of Therapy:  Vancomycin trough level 15-20 mcg/ml  Plan:  Vancomycin 500 mg IV q24h Zosyn 3.375g IV q8h Trend WBC, temp, renal function  Drug levels as indicated   Arlean Hoppingorey M. Newman PiesBall, PharmD, BCPS Clinical Pharmacist Pager [phone] 08/26/2015,8:53 PM

## 2015-08-26 NOTE — ED Notes (Addendum)
Pt has no complaints.  States his doctor called him at home this evening and he was told to come to the hospital to be admitted and get antibiotics.  Pt denies and pain, fever, chest discomfort, cough, or SOB.  Ambulates without difficulty.  States he doesn't know why he was told to come.

## 2015-08-26 NOTE — H&P (Signed)
History and Physical  Patient Name: [name]     ZOX:[id number]    DOB: 29-Apr-1925    DOA: 08/26/2015 Referring physician: Vanetta Mulders, MD PCP: Oliver Barre, MD      Chief Complaint: Chills  HPI: [name] is a 82 y.o. male with a past medical history significant for HTN, CKD III, and remote history of colon cancer s/p resection who presents with chills and positive blood culture.  The patient was in his usual state of health until 24 hours ago when he started to feel "droopy" and went to bed. He woke up after that with rigors and fever and came to the ER.  There had been no preceding cough, fever, sputum, dyspnea, dysuria, urinary urgency, hematuria or abdominal pain.  In the ER he had leukopenia, febrile to 101F, and tachycardia. Code sepsis was called and blood cultures were drawn before vancomycin and cefepime were administered (I'm not clear how much of this was administered). He was complaining of prominent left ear pain and so cerumen was debrided from the ear.  At that point, it appears the patient stated he felt completely better, his lactic acid was normal, and so he was discharged home with Keflex (I'm not clear if this was for an otitis media).   Today, the patient's blood cultures grew GNB in 2 to bottles, and so he was called back to the ER.  In the ED, he stated that he felt fine, had adenoma rigors and was his normal self. He was afebrile, not tachycardic, and hypertensive. The WBC was normal, the lactic acid levels initially normal, and he was started on Zosyn. TRH were asked to admit for GNB bacteremia.     Review of Systems:  Pt complains of rigors, fever, droopy, chronic LUTS. Pt denies any cough, sputum, respiratory symptoms.  Worsening urinary symptoms, perineal pain, abdominal pain, nausea, vomiting, cramps, dairrhea.  All other systems negative except as just noted or noted in the history of present illness.   Allergies: No Known Allergies  Home  medications: 1. Clonidine 0.2 mg twice daily 2. HCTZ 25 mg daily 3. Metoprolol titrate 50 mg twice daily 4. Amlodipine 10 mg daily 5. Omeprazole 40 mg daily Tramadol when necessary Cephalexin since yesterday  Past medical history: 1. HTN 2. BPH 3. Diet-controlled diabetes? 4. Colon cancer status post resection     There are old notes referring to multiple lung nodules that were thought to be metastatic disease, but I see no further mention of this and the patient does not currently follow up with oncology 5. Esophageal stricture  Past surgical history: 1. Partial colectomy  Family history:  Hypertension   Social History:  Patient lives with his wife.  He worked at [employer].  He smoked many years ago.  He does not drink.  He does not walk with a cane or walker.  He does not report dementia and is independent with all ADLs and IADLs.      Physical Exam: BP 200/93 mmHg  Pulse 76  Temp(Src) 98.5 F (36.9 C)  Resp 23  SpO2 99% General appearance: Well-developed, adult male, alert and in no acute distress.   Eyes: Anicteric, conjunctiva pink, lids and lashes normal.     ENT: No nasal deformity, discharge, or epistaxis.  OP moist without lesions.  Edentulous Lymph: No cervical, supraclavicular or axillary or groin lymphadenopathy. Skin: Warm and dry.  No suspicious rashes or lesions on scalp, neck, face, back, chest, arms, or legs.  Wart  on left pinky toe.   Cardiac: RRR, nl S1-S2, no murmurs appreciated.  Capillary refill is brisk.  No LE edema.  Radial pulses 2+ and symmetric. Respiratory: Normal respiratory rate and rhythm.  CTAB without rales or wheezes. Abdomen: Abdomen soft without rigidity.  No TTP. No ascites, distension.   GU: Rectal tone normal.  Prostate large but not boggy.  No particular prostate tenderness. MSK: No deformities or effusions. Neuro: Sensorium intact and responding to questions, attention normal.  Speech is fluent.  Moves all extremities equally  and with normal coordination.    Psych: Behavior appropriate.  Affect normal.  No evidence of aural or visual hallucinations or delusions.       Labs on Admission:  The metabolic panel shows hypokalemia, stable renal function.  Transaminases and bilirubin normal. Lactic acid 2.07 mmol/L. The complete blood count shows WBC 4.4 from 3.7K/uL yesterday.  Chronic normocytic anemia. UA completely clear. Blood culture from 08/25/14: 2/2 GNB   Radiological Exams on Admission: Personally reviewed: Dg Chest 2 View 08/26/2015   Clear   EKG: Independently reviewed. Sinus, rate 79.  QTc 497.  No STTW changes.    Assessment/Plan 1. Sepsis:  This is new.  Patient meets criteria given initial presentation yesterday with tachycardia, leukopenia, and now positive blood cultures and organ dysfunction with elevated lactic acid.  Urine cultures drawn.  Lactate exceeds 2 mmol/L and repeat ordered within 6 hours.  MAP > 65 mmHg. -Piperacillin-tazobactam for GNB bacteremia -30 ml/kg bolus, will repeat lactic acid -Telemetry -Vital signs every one hour for the first 4 hours -Acetaminophen for fever -Admit to tele given how stable he appears and hemodynamically he is stable    2. CKD stage III:  Stable  3. HTN:  Hypertensive at admission. Continue home amlodipine, HCTZ, metoprolol, and clonidine   4. Hypokalemia:  Check magnesium Replete K Trend BMP     DVT PPx: Lovenox Diet: Regular Consultants: None Code Status: Full Family Communication: None  Medical decision making: What exists of the patient's previous chart was reviewed in depth and the case was discussed with Dr. Deretha EmoryZackowski. Patient seen 10:33 PM on 08/26/2015.  Disposition Plan:  Admit to inpatient for treatment of GNB bacteremia and mild sepsis.      Alberteen SamChristopher P Danford [facility] Pager [phone]

## 2015-08-26 NOTE — ED Provider Notes (Signed)
CSN: [id number]     Arrival date & time 08/25/15  2331 History  By signing my name below, I, [name], attest that this documentation has been prepared under the direction and in the presence of Chase Kaplan, MD. Electronically Signed: Phillis [name], ED Scribe. 08/26/2015. 1:04 AM.    Chief Complaint  Patient presents with  . Shaking   The history is provided by the patient and a relative. No language interpreter was used.   HPI Comments: [name] is a 82 y.o. male with a hx of Type II DM, left ear hearing loss, HTN, colon cancer, renal insufficiency, and nephrolithiasis who presents to the Emergency Department complaining of tremors/ "shaking" onset 4 hours ago. Pt states that he went to lay down for bed when he began to have generalized tremors. He denies that the shaking felt like chills. Pt did not check his temperature and did not take any medications for his symptoms.Pt states now that his tremor symptoms have since subsided but feels like he is having numbness of his left ear. He reports that his ear symptoms have been occurring for "a long time." Pt reports that he has been using Auro ear drops in his ear to no relief, although it has worked in the past. He denies any recent otalgia, ear drainage, loss of hearing, cough, fever, chills, nausea, vomiting, chest pain, hematuria, shoulder pain, dysuria, or SOB. He denies MI or stroke hx. Pt lives at home with his wife.   Past Medical History  Diagnosis Date  . DIABETES MELLITUS, TYPE II 12/24/2007  . HYPERLIPIDEMIA 03/24/2007  . ANEMIA-NOS 06/02/2007  . HEARING LOSS, LEFT EAR 01/18/2010  . HYPERTENSION 03/24/2007  . PULMONARY NODULE 06/02/2007  . STRICTURE, ESOPHAGEAL 06/02/2007  . GERD 06/02/2007  . GASTROENTERITIS, ACUTE 05/30/2010  . CONSTIPATION 01/18/2010  . CHOLELITHIASIS 06/02/2007  . RENAL INSUFFICIENCY 06/02/2007  . OSTEOARTHRITIS, KNEES, BILATERAL 01/26/2008  . FEVER UNSPECIFIED 01/18/2010  . Abdominal pain, right upper  quadrant 06/12/2007  . EPIGASTRIC TENDERNESS 01/18/2010  . COLON CANCER, HX OF 03/24/2007  . NEPHROLITHIASIS, HX OF 06/02/2007   Past Surgical History  Procedure Laterality Date  . Small intestine surgery     Family History  Problem Relation Age of Onset  . Hypertension Other    Social History  Substance Use Topics  . Smoking status: Former Games developer  . Smokeless tobacco: None  . Alcohol Use: No    Review of Systems 10 Systems reviewed and all are negative for acute change except as noted in the HPI.  Allergies  Review of patient's allergies indicates no known allergies.  Home Medications   Prior to Admission medications   Medication Sig Start Date End Date Taking? Authorizing Provider  amLODipine (NORVASC) 10 MG tablet TAKE ONE TABLET BY MOUTH ONCE DAILY Patient not taking: Reported on 08/26/2015 12/07/14   Corwin Levins, MD  cloNIDine (CATAPRES) 0.2 MG tablet TAKE ONE TABLET BY MOUTH TWICE DAILY 10/26/14   Corwin Levins, MD  hydrochlorothiazide (HYDRODIURIL) 25 MG tablet Take 1 tablet (25 mg total) by mouth daily. 01/11/15   Corwin Levins, MD  metoprolol (LOPRESSOR) 50 MG tablet TAKE ONE TABLET BY MOUTH TWICE DAILY 03/30/15   Corwin Levins, MD  omeprazole (PRILOSEC) 20 MG capsule TAKE ONE CAPSULE BY MOUTH ONCE DAILY 06/06/15   Corwin Levins, MD  traMADol (ULTRAM) 50 MG tablet Take 1 tablet (50 mg total) by mouth every 8 (eight) hours as needed. Patient not taking: Reported on 08/26/2015  01/11/15   Corwin LevinsJames W John, MD   BP 185/90 mmHg  Pulse 78  Temp(Src) 98 F (36.7 C) (Oral)  Resp 22  SpO2 100% Physical Exam  Constitutional: He appears well-developed and well-nourished. No distress.  HENT:  Head: Normocephalic and atraumatic.  Mouth/Throat: Oropharynx is clear and moist. No oropharyngeal exudate.  Left ear canal appears to be impacted with cerumen  Eyes: Conjunctivae and EOM are normal. Pupils are equal, round, and reactive to light. Right eye exhibits no discharge. Left eye exhibits no  discharge. No scleral icterus.  Neck: Normal range of motion. Neck supple. No JVD present. No thyromegaly present.  Cardiovascular: Regular rhythm and intact distal pulses.  Tachycardia present.  Exam reveals no gallop and no friction rub.   Murmur (systolic) heard.  Systolic murmur is present  Pulmonary/Chest: Effort normal and breath sounds normal. No respiratory distress. He has no wheezes. He has no rales.  Abdominal: Soft. Bowel sounds are normal. He exhibits no distension and no mass. There is no tenderness.  Musculoskeletal: Normal range of motion. He exhibits no edema or tenderness.  Lymphadenopathy:    He has no cervical adenopathy.  Neurological: He is alert. No cranial nerve deficit or sensory deficit. Coordination normal.  Cranial nerves 2-12 intact; gross sensory intact for bilateral upper and lower extremities; strength extremities 4/5 of bilateral upper and lower extremities  Skin: Skin is warm and dry. No rash noted. No erythema.  Psychiatric: He has a normal mood and affect. His behavior is normal.  Nursing note and vitals reviewed.   ED Course  Procedures (including critical care time) DIAGNOSTIC STUDIES: Oxygen Saturation is 100% on RA, normal by my interpretation.    COORDINATION OF CARE: 1:04 AM-Discussed treatment plan which includes labs and removal of cerumen impaction with pt at bedside and pt agreed to plan.   2:48 AM- pt had cerumen disimpacted and feels better. Code Sepsis was called because pt had low WBC count, fever, and tachycardia with no source of infection. On reassessment, pt's exam stayed unchanged and pt feels "great" and comfortable going home. Differential diagnosis is occult bacteremia with sepsis vs UTI with a negative UA vs viral syndrome. Blood cultures have been sent out. Pt is reliable and has good social support and we feel comfortable discharging him home at this time. Strict return precautions have been discussed and pt agrees with the plan.  Pt told if blood cultures are positive, he will receive a phone call from the hospital and will have to return for recheck.    Labs Review Labs Reviewed  CBC WITH DIFFERENTIAL/PLATELET - Abnormal; Notable for the following:    WBC 3.7 (*)    RBC 3.52 (*)    Hemoglobin 11.0 (*)    HCT 34.0 (*)    Monocytes Absolute 0.0 (*)    All other components within normal limits  COMPREHENSIVE METABOLIC PANEL - Abnormal; Notable for the following:    Potassium 3.2 (*)    Glucose, Bld 134 (*)    BUN 24 (*)    Creatinine, Ser 1.86 (*)    Calcium 6.7 (*)    ALT 9 (*)    GFR calc non Af Amer 31 (*)    GFR calc Af Amer 36 (*)    All other components within normal limits  URINALYSIS, ROUTINE W REFLEX MICROSCOPIC (NOT AT [facility]) - Abnormal; Notable for the following:    Glucose, UA 250 (*)    Hgb urine dipstick SMALL (*)  Protein, ur 100 (*)    All other components within normal limits  URINE MICROSCOPIC-ADD ON - Abnormal; Notable for the following:    Squamous Epithelial / LPF 0-5 (*)    Bacteria, UA RARE (*)    All other components within normal limits  CULTURE, BLOOD (ROUTINE X 2)  CULTURE, BLOOD (ROUTINE X 2)  URINE CULTURE  I-STAT CG4 LACTIC ACID, ED    Imaging Review Dg Chest 2 View  08/26/2015  CLINICAL DATA:  Acute onset of shaking and tremors. Initial encounter. EXAM: CHEST  2 VIEW COMPARISON:  Chest radiograph performed 12/07/2012 FINDINGS: The lungs are well-aerated and clear. There is no evidence of focal opacification, pleural effusion or pneumothorax. The heart is normal in size; the mediastinal contour is within normal limits. No acute osseous abnormalities are seen. Mild degenerative change is noted at the left glenohumeral joint. Clips are noted within the right upper quadrant, reflecting prior cholecystectomy. IMPRESSION: No acute cardiopulmonary process seen. Electronically Signed   By: Roanna Raider M.D.   On: 08/26/2015 02:50   I have personally reviewed and evaluated these  images and lab results as part of my medical decision-making.   EKG Interpretation None      MDM   Final diagnoses:  Rigors  SIRS (systemic inflammatory response syndrome) (HCC)   I personally performed the services described in this documentation, which was scribed in my presence. The recorded information has been reviewed and is accurate.   Pt comes in with an acute episode of rigors. He reports feeling weak at that time, but now he feels a lot better. His oral temp is 99 - 0 SIRS at arrival - so we got a rectal temp - which is elevated. With a rectal temp elevated and the chills-  Ddx includes sepsis, perforation, bacteremia, UTI. We started code sepsis workup. Pt failed to show any more SIRs criteria - lactate was normal. Fever improved. ROS from head to toe is not indicative of any source of infection. UA is clear. With a high index for suspicion for an occult bacteremia, we decided to observe patient in the ER, start an oral challenge, give ivab. His condition never deteriorated - so he was discharged. Strict ER return precautions discussed. He has been made aware of the blood cultures - if + he is aware that he will have to return to the ER. Wife and pt are reliable. Will d/c with keflex.   RN was able to disimpact the cerumen from the R ear.    Chase Kaplan, MD 08/27/15 [id number]

## 2015-08-26 NOTE — Progress Notes (Signed)
ANTIBIOTIC CONSULT NOTE - INITIAL  Pharmacy Consult for Vancomycin/Cefepime  Indication: rule out sepsis  No Known Allergies  Patient Measurements: 64 kg  Vital Signs: Temp: 101.4 F (38.6 C) (01/06 0109) Temp Source: Rectal (01/06 0109) BP: 209/95 mmHg (01/06 0100) Pulse Rate: 104 (01/06 0100)  Labs:  Recent Labs  08/26/15 0015  WBC 3.7*  HGB 11.0*  PLT 210  CREATININE 1.86*   Medical History: Past Medical History  Diagnosis Date  . DIABETES MELLITUS, TYPE II 12/24/2007  . HYPERLIPIDEMIA 03/24/2007  . ANEMIA-NOS 06/02/2007  . HEARING LOSS, LEFT EAR 01/18/2010  . HYPERTENSION 03/24/2007  . PULMONARY NODULE 06/02/2007  . STRICTURE, ESOPHAGEAL 06/02/2007  . GERD 06/02/2007  . GASTROENTERITIS, ACUTE 05/30/2010  . CONSTIPATION 01/18/2010  . CHOLELITHIASIS 06/02/2007  . RENAL INSUFFICIENCY 06/02/2007  . OSTEOARTHRITIS, KNEES, BILATERAL 01/26/2008  . FEVER UNSPECIFIED 01/18/2010  . Abdominal pain, right upper quadrant 06/12/2007  . EPIGASTRIC TENDERNESS 01/18/2010  . COLON CANCER, HX OF 03/24/2007  . NEPHROLITHIASIS, HX OF 06/02/2007    Assessment: 82 y/o M with shaking/tremors, WBC 3.7, noted renal dysfunction, hypertensive, febrile at 101.4, other labs reviewed.   Goal of Therapy:  Vancomycin trough level 15-20 mcg/ml  Plan:  -Vancomycin 1000 mg IV x 1 in the ED, then 500 mg IV q24h -Cefepime 1g IV q24h -Trend WBC, temp, renal function  -Drug levels as indicated   Abran DukeLedford, Jyl [place] 08/26/2015,2:30 AM

## 2015-08-27 ENCOUNTER — Encounter (HOSPITAL_COMMUNITY): Payer: Self-pay | Admitting: *Deleted

## 2015-08-27 DIAGNOSIS — A4151 Sepsis due to Escherichia coli [E. coli]: Secondary | ICD-10-CM | POA: Diagnosis present

## 2015-08-27 DIAGNOSIS — E876 Hypokalemia: Secondary | ICD-10-CM | POA: Diagnosis present

## 2015-08-27 DIAGNOSIS — R7881 Bacteremia: Secondary | ICD-10-CM

## 2015-08-27 DIAGNOSIS — Z85038 Personal history of other malignant neoplasm of large intestine: Secondary | ICD-10-CM

## 2015-08-27 DIAGNOSIS — N183 Chronic kidney disease, stage 3 (moderate): Secondary | ICD-10-CM

## 2015-08-27 DIAGNOSIS — I10 Essential (primary) hypertension: Secondary | ICD-10-CM

## 2015-08-27 LAB — COMPREHENSIVE METABOLIC PANEL
ALT: 10 U/L — ABNORMAL LOW (ref 17–63)
AST: 25 U/L (ref 15–41)
Albumin: 2.8 g/dL — ABNORMAL LOW (ref 3.5–5.0)
Alkaline Phosphatase: 46 U/L (ref 38–126)
Anion gap: 9 (ref 5–15)
BUN: 18 mg/dL (ref 6–20)
CHLORIDE: 107 mmol/L (ref 101–111)
CO2: 24 mmol/L (ref 22–32)
Calcium: 6 mg/dL — CL (ref 8.9–10.3)
Creatinine, Ser: 1.66 mg/dL — ABNORMAL HIGH (ref 0.61–1.24)
GFR calc Af Amer: 41 mL/min — ABNORMAL LOW (ref >60)
GFR, EST NON AFRICAN AMERICAN: 35 mL/min — AB (ref >60)
Glucose, Bld: 111 mg/dL — ABNORMAL HIGH (ref 65–99)
POTASSIUM: 2.9 mmol/L — AB (ref 3.5–5.1)
Sodium: 140 mmol/L (ref 135–145)
Total Bilirubin: 0.8 mg/dL (ref 0.3–1.2)
Total Protein: 5.8 g/dL — ABNORMAL LOW (ref 6.5–8.1)

## 2015-08-27 LAB — BASIC METABOLIC PANEL
ANION GAP: 11 (ref 5–15)
BUN: 16 mg/dL (ref 6–20)
CHLORIDE: 101 mmol/L (ref 101–111)
CO2: 26 mmol/L (ref 22–32)
Calcium: 7.1 mg/dL — ABNORMAL LOW (ref 8.9–10.3)
Creatinine, Ser: 1.81 mg/dL — ABNORMAL HIGH (ref 0.61–1.24)
GFR calc non Af Amer: 32 mL/min — ABNORMAL LOW (ref >60)
GFR, EST AFRICAN AMERICAN: 37 mL/min — AB (ref >60)
Glucose, Bld: 125 mg/dL — ABNORMAL HIGH (ref 65–99)
POTASSIUM: 3.9 mmol/L (ref 3.5–5.1)
Sodium: 138 mmol/L (ref 135–145)

## 2015-08-27 LAB — CBC
HEMATOCRIT: 30.7 % — AB (ref 39.0–52.0)
HEMOGLOBIN: 9.8 g/dL — AB (ref 13.0–17.0)
MCH: 30.5 pg (ref 26.0–34.0)
MCHC: 31.9 g/dL (ref 30.0–36.0)
MCV: 95.6 fL (ref 78.0–100.0)
Platelets: 172 10*3/uL (ref 150–400)
RBC: 3.21 MIL/uL — ABNORMAL LOW (ref 4.22–5.81)
RDW: 12.9 % (ref 11.5–15.5)
WBC: 4.9 10*3/uL (ref 4.0–10.5)

## 2015-08-27 LAB — LACTIC ACID, PLASMA
LACTIC ACID, VENOUS: 0.9 mmol/L (ref 0.5–2.0)
LACTIC ACID, VENOUS: 1.7 mmol/L (ref 0.5–2.0)

## 2015-08-27 LAB — URINE CULTURE: Culture: 2000

## 2015-08-27 LAB — MAGNESIUM
Magnesium: 0.4 mg/dL — CL (ref 1.7–2.4)
Magnesium: 1.2 mg/dL — ABNORMAL LOW (ref 1.7–2.4)

## 2015-08-27 MED ORDER — INFLUENZA VAC SPLIT QUAD 0.5 ML IM SUSY
0.5000 mL | PREFILLED_SYRINGE | INTRAMUSCULAR | Status: AC
  Administered 2015-08-28: 0.5 mL via INTRAMUSCULAR
  Filled 2015-08-27: qty 0.5

## 2015-08-27 MED ORDER — MAGNESIUM SULFATE 2 GM/50ML IV SOLN
2.0000 g | Freq: Once | INTRAVENOUS | Status: AC
  Administered 2015-08-28: 2 g via INTRAVENOUS
  Filled 2015-08-27: qty 50

## 2015-08-27 MED ORDER — HYDRALAZINE HCL 20 MG/ML IJ SOLN
10.0000 mg | Freq: Four times a day (QID) | INTRAMUSCULAR | Status: DC | PRN
  Administered 2015-08-27: 10 mg via INTRAVENOUS
  Filled 2015-08-27: qty 1

## 2015-08-27 MED ORDER — SODIUM CHLORIDE 0.9 % IV SOLN
1.0000 g | Freq: Once | INTRAVENOUS | Status: AC
  Administered 2015-08-27: 1 g via INTRAVENOUS
  Filled 2015-08-27: qty 10

## 2015-08-27 MED ORDER — POTASSIUM CHLORIDE 10 MEQ/100ML IV SOLN
10.0000 meq | INTRAVENOUS | Status: AC
  Administered 2015-08-27 (×4): 10 meq via INTRAVENOUS
  Filled 2015-08-27: qty 100

## 2015-08-27 MED ORDER — MAGNESIUM SULFATE 2 GM/50ML IV SOLN
2.0000 g | Freq: Once | INTRAVENOUS | Status: AC
  Administered 2015-08-27: 2 g via INTRAVENOUS
  Filled 2015-08-27: qty 50

## 2015-08-27 NOTE — Progress Notes (Signed)
CRITICAL VALUE ALERT  Critical value received:  K+ 2.9/Ca+ 6.0   Date of notification:  08/27/2015  Time of notification:  0545  Critical value read back:Yes.    Nurse who received alert:  Bernie CoveyKimberly Tyreona Panjwani RN  MD notified (1st page):  Everett Graffhomas Callahan NP  Time of first page:  [phone]  MD notified (2nd page):  Time of second page:  Responding MD:  Everett Graffhomas Callahan NP  Time MD responded:  [phone]

## 2015-08-27 NOTE — Progress Notes (Signed)
ANTIBIOTIC CONSULT NOTE  Pharmacy Consult for Zosyn Indication: Bacteremia   No Known Allergies  Patient Measurements: 64 kg  Vital Signs: Temp: 98.7 F (37.1 C) (01/07 0452) Temp Source: Oral (01/07 0452) BP: 163/77 mmHg (01/07 0452) Pulse Rate: 67 (01/07 0452)  Labs:  Recent Labs  08/26/15 0015 08/26/15 1916 08/27/15 0412  WBC 3.7* 4.4 4.9  HGB 11.0* 10.0* 9.8*  PLT 210 185 172  CREATININE 1.86* 1.82* 1.66*   Medical History: Past Medical History  Diagnosis Date  . DIABETES MELLITUS, TYPE II 12/24/2007  . HYPERLIPIDEMIA 03/24/2007  . ANEMIA-NOS 06/02/2007  . HEARING LOSS, LEFT EAR 01/18/2010  . HYPERTENSION 03/24/2007  . PULMONARY NODULE 06/02/2007  . STRICTURE, ESOPHAGEAL 06/02/2007  . GERD 06/02/2007  . GASTROENTERITIS, ACUTE 05/30/2010  . CONSTIPATION 01/18/2010  . CHOLELITHIASIS 06/02/2007  . RENAL INSUFFICIENCY 06/02/2007  . OSTEOARTHRITIS, KNEES, BILATERAL 01/26/2008  . FEVER UNSPECIFIED 01/18/2010  . Abdominal pain, right upper quadrant 06/12/2007  . EPIGASTRIC TENDERNESS 01/18/2010  . COLON CANCER, HX OF 03/24/2007  . NEPHROLITHIASIS, HX OF 06/02/2007    Assessment: 89 YOM admitted 08/26/2015 with chills and 2/2 GNR in blood culture. Previously admitted 1/6 and was discharged before blood culture results resulted.   WBC wnl, afebrile, LA 0.9   1/6>>Vanc>>1/6  1/6>>Zosyn>>  1/7>>cefepime>>1/7   1/6 BCx x2>>2/2 GNR    Goal of Therapy:  Eradication of infection   Plan:  Continue Zosyn 3.375g IV q8h F/U renal fxn (may need to decrease to zosyn 2.25g if worsens)  F/U c/s, abx de-escalation, LOT   Francisco Ostrovsky C. Marvis MoellerMiles, PharmD Pharmacy Resident  Pager: [phone] 08/27/2015 7:48 AM

## 2015-08-27 NOTE — Progress Notes (Signed)
[facility] Progress Note  Patient: [name] ZOX:[id number]   PCP: Oliver Barre, MD DOB: November 10, 1924   DOA: 08/26/2015   DOS: 08/27/2015   Date of Service: the patient was seen and examined on 08/27/2015  Subjective: Patient denies any acute complaints of chest pain and abdominal pain nausea vomiting. No diarrhea. No burning urination. Does complain of some ear pain Nutrition: Able to tolerate oral diet Activity: Ambulating in the room Last BM: 08/26/2015  Assessment and Plan: 1. E. coli sepsis [facility]) Patient presented to ER with low WBC, fever, lactic acidosis with fever and right and earache. Blood cultures positive for Escherichia coli 2 WBC improved, lactic acid improved, urine without evidence of any WBC. Patient does not have any abdominal symptoms at present. Monitor closely and will attempt to identify the source of infection. Continue Zosyn at present  2. Hypokalemia and hypomagnesemia. Etiology unclear replacing and rechecking.  3. Chronic kidney disease. Renal function stable. Continue monitoring.  4. Prior history of colectomy. Prior history of colon cancer. At present patient does not have any abdominal tenderness or intra-abdominal symptom. If patient continues to have bacteremic will need CT of the abdomen to rule out any intra-abdominal source.  DVT Prophylaxis: subcutaneous Heparin Nutrition: Able to tolerate oral diet Advance goals of care discussion: Full code  Brief Summary of Hospitalization:  HPI: As per the H and P dictated on admission, "patient presented with complains of ear ache fever and chills, no abdominal symptoms. Patient was discharged on Keflex and return back to the ER due to ground negative bacteremia" Daily update, Procedures: None Consultants: None Antibiotics: Anti-infectives    Start     Dose/Rate Route Frequency Ordered Stop   08/27/15 0330  piperacillin-tazobactam (ZOSYN) IVPB 3.375 g     3.375 g 12.5 mL/hr over 240 Minutes  Intravenous Every 8 hours 08/26/15 2055     08/27/15 0300  vancomycin (VANCOCIN) 500 mg in sodium chloride 0.9 % 100 mL IVPB  Status:  Discontinued     500 mg 100 mL/hr over 60 Minutes Intravenous Every 24 hours 08/26/15 2052 08/26/15 2157   08/26/15 2100  piperacillin-tazobactam (ZOSYN) IVPB 3.375 g     3.375 g 100 mL/hr over 30 Minutes Intravenous  Once 08/26/15 2049 08/26/15 2208   08/26/15 2100  vancomycin (VANCOCIN) IVPB 1000 mg/200 mL premix  Status:  Discontinued     1,000 mg 200 mL/hr over 60 Minutes Intravenous  Once 08/26/15 2049 08/26/15 2052       Family Communication: No family was present at bedside, at the time of interview.   Disposition:  Expected discharge date: 08/29/2015 Barriers to safe discharge: Bacteremia resolution   Intake/Output Summary (Last 24 hours) at 08/27/15 1517 Last data filed at 08/27/15 1350  Gross per 24 hour  Intake 2943.33 ml  Output   1875 ml  Net 1068.33 ml   Filed Weights   08/27/15 0004  Weight: 57.9 kg (127 lb 10.3 oz)    Objective: Physical Exam: Filed Vitals:   08/27/15 0212 08/27/15 0330 08/27/15 0452 08/27/15 0945  BP: 174/81 150/62 163/77 137/79  Pulse: 71 71 67 72  Temp: 98.2 F (36.8 C) 98.3 F (36.8 C) 98.7 F (37.1 C) 98.2 F (36.8 C)  TempSrc: Oral Oral Oral Oral  Resp: 20 19 19 20   Height:      Weight:      SpO2: 100% 100% 100% 100%     General: Appear in mild distress, no Rash; Oral Mucosa moist. Cardiovascular: S1  and S2 Present, no Murmur, no JVD Respiratory: Bilateral Air entry present and Clear to Auscultation, no Crackles, no wheezes Abdomen: Bowel Sound present, Soft and no tenderness Extremities: no Pedal edema, no calf tenderness Neurology: Grossly no focal neuro deficit.  Data Reviewed: CBC:  Recent Labs Lab 08/26/15 0015 08/26/15 1916 08/27/15 0412  WBC 3.7* 4.4 4.9  NEUTROABS 2.6 2.9  --   HGB 11.0* 10.0* 9.8*  HCT 34.0* 30.4* 30.7*  MCV 96.6 96.8 95.6  PLT 210 185 172   Basic  Metabolic Panel:  Recent Labs Lab 08/26/15 0015 08/26/15 1916 08/27/15 0412 08/27/15 0945  NA 142 139 140  --   K 3.2* 3.2* 2.9*  --   CL 101 103 107  --   CO2 26 26 24   --   GLUCOSE 134* 120* 111*  --   BUN 24* 21* 18  --   CREATININE 1.86* 1.82* 1.66*  --   CALCIUM 6.7* 6.6* 6.0*  --   MG  --   --   --  0.4*   Liver Function Tests:  Recent Labs Lab 08/26/15 0015 08/26/15 1916 08/27/15 0412  AST 20 32 25  ALT 9* 13* 10*  ALKPHOS 58 54 46  BILITOT 0.5 1.0 0.8  PROT 7.0 7.0 5.8*  ALBUMIN 3.7 3.5 2.8*   No results for input(s): LIPASE, AMYLASE in the last 168 hours. No results for input(s): AMMONIA in the last 168 hours.  Cardiac Enzymes: No results for input(s): CKTOTAL, CKMB, CKMBINDEX, TROPONINI in the last 168 hours.  BNP (last 3 results) No results for input(s): BNP in the last 8760 hours.  CBG: No results for input(s): GLUCAP in the last 168 hours.  Recent Results (from the past 240 hour(s))  Blood Culture (routine x 2)     Status: None (Preliminary result)   Collection Time: 08/26/15  1:50 AM  Result Value Ref Range Status   Specimen Description BLOOD RIGHT ARM  Final   Special Requests AEROBIC BOTTLE ONLY 10ML  Final   Culture  Setup Time   Final    GRAM NEGATIVE RODS AEROBIC BOTTLE ONLY CRITICAL RESULT CALLED TO, READ BACK BY AND VERIFIED WITH: L MILLER RN 1621 08/26/15 A BROWNING    Culture ESCHERICHIA COLI  Final   Report Status PENDING  Incomplete  Blood Culture (routine x 2)     Status: None (Preliminary result)   Collection Time: 08/26/15  1:55 AM  Result Value Ref Range Status   Specimen Description BLOOD LEFT ARM  Final   Special Requests AEROBIC BOTTLE ONLY 10ML  Final   Culture  Setup Time   Final    GRAM NEGATIVE RODS AEROBIC BOTTLE ONLY CRITICAL RESULT CALLED TO, READ BACK BY AND VERIFIED WITH: L MILLER RN 1621 08/26/15 A BROWNING    Culture ESCHERICHIA COLI  Final   Report Status PENDING  Incomplete  Urine culture     Status: None    Collection Time: 08/26/15  4:14 AM  Result Value Ref Range Status   Specimen Description URINE, RANDOM  Final   Special Requests NONE  Final   Culture 2,000 COLONIES/mL INSIGNIFICANT GROWTH  Final   Report Status 08/27/2015 FINAL  Final     Studies: No results found.   Scheduled Meds: . amLODipine  10 mg Oral Daily  . cloNIDine  0.2 mg Oral BID  . enoxaparin (LOVENOX) injection  30 mg Subcutaneous Daily  . [START ON 08/28/2015] Influenza vac split quadrivalent PF  0.5 mL  Intramuscular Tomorrow-1000  . metoprolol  50 mg Oral BID  . pantoprazole  40 mg Oral Daily  . piperacillin-tazobactam (ZOSYN)  IV  3.375 g Intravenous Q8H  . sodium chloride  3 mL Intravenous Q12H   Continuous Infusions:  PRN Meds: acetaminophen **OR** acetaminophen, hydrALAZINE, promethazine, senna-docusate  Time spent: 30 minutes  Author: Lynden Oxford, MD [facility] Pager: [phone] 08/27/2015 3:17 PM  If 7PM-7AM, please contact night-coverage at [email or web address], password [email or web address]

## 2015-08-28 DIAGNOSIS — A4151 Sepsis due to Escherichia coli [E. coli]: Secondary | ICD-10-CM | POA: Diagnosis not present

## 2015-08-28 LAB — COMPREHENSIVE METABOLIC PANEL
ALT: 11 U/L — AB (ref 17–63)
ANION GAP: 10 (ref 5–15)
AST: 22 U/L (ref 15–41)
Albumin: 2.8 g/dL — ABNORMAL LOW (ref 3.5–5.0)
Alkaline Phosphatase: 46 U/L (ref 38–126)
BUN: 14 mg/dL (ref 6–20)
CHLORIDE: 102 mmol/L (ref 101–111)
CO2: 27 mmol/L (ref 22–32)
CREATININE: 1.94 mg/dL — AB (ref 0.61–1.24)
Calcium: 6.9 mg/dL — ABNORMAL LOW (ref 8.9–10.3)
GFR calc non Af Amer: 29 mL/min — ABNORMAL LOW (ref >60)
GFR, EST AFRICAN AMERICAN: 34 mL/min — AB (ref >60)
Glucose, Bld: 108 mg/dL — ABNORMAL HIGH (ref 65–99)
Potassium: 3.5 mmol/L (ref 3.5–5.1)
SODIUM: 139 mmol/L (ref 135–145)
Total Bilirubin: 1 mg/dL (ref 0.3–1.2)
Total Protein: 6.4 g/dL — ABNORMAL LOW (ref 6.5–8.1)

## 2015-08-28 LAB — CBC WITH DIFFERENTIAL/PLATELET
BASOS PCT: 0 %
Basophils Absolute: 0 10*3/uL (ref 0.0–0.1)
EOS ABS: 0.3 10*3/uL (ref 0.0–0.7)
EOS PCT: 4 %
HCT: 33.3 % — ABNORMAL LOW (ref 39.0–52.0)
Hemoglobin: 11 g/dL — ABNORMAL LOW (ref 13.0–17.0)
LYMPHS ABS: 0.7 10*3/uL (ref 0.7–4.0)
Lymphocytes Relative: 11 %
MCH: 31.3 pg (ref 26.0–34.0)
MCHC: 33 g/dL (ref 30.0–36.0)
MCV: 94.9 fL (ref 78.0–100.0)
Monocytes Absolute: 0.7 10*3/uL (ref 0.1–1.0)
Monocytes Relative: 11 %
Neutro Abs: 4.8 10*3/uL (ref 1.7–7.7)
Neutrophils Relative %: 74 %
PLATELETS: 189 10*3/uL (ref 150–400)
RBC: 3.51 MIL/uL — AB (ref 4.22–5.81)
RDW: 12.8 % (ref 11.5–15.5)
WBC: 6.4 10*3/uL (ref 4.0–10.5)

## 2015-08-28 LAB — CULTURE, BLOOD (ROUTINE X 2)

## 2015-08-28 LAB — URINE CULTURE: Culture: 2000

## 2015-08-28 LAB — MAGNESIUM: Magnesium: 1.9 mg/dL (ref 1.7–2.4)

## 2015-08-28 MED ORDER — HYDRALAZINE HCL 20 MG/ML IJ SOLN: 10.0000 mg | INTRAMUSCULAR | Status: DC | PRN

## 2015-08-28 MED ORDER — ONDANSETRON HCL 4 MG/2ML IJ SOLN
4.0000 mg | Freq: Four times a day (QID) | INTRAMUSCULAR | Status: DC | PRN
Start: 2015-08-28 — End: 2015-08-29

## 2015-08-28 MED ORDER — HEPARIN SODIUM (PORCINE) 5000 UNIT/ML IJ SOLN
5000.0000 [IU] | Freq: Three times a day (TID) | INTRAMUSCULAR | Status: DC
  Administered 2015-08-28 – 2015-08-29 (×4): 5000 [IU] via SUBCUTANEOUS
  Filled 2015-08-28 (×3): qty 1

## 2015-08-28 MED ORDER — CEFAZOLIN SODIUM-DEXTROSE 2-3 GM-% IV SOLR
2.0000 g | Freq: Two times a day (BID) | INTRAVENOUS | Status: DC
  Administered 2015-08-28 – 2015-08-29 (×3): 2 g via INTRAVENOUS
  Filled 2015-08-28 (×4): qty 50

## 2015-08-28 NOTE — Progress Notes (Signed)
Patient A/O, no noted distress. Tolerated all meds well. Educated spouse of patient reason of admission and medication. Patient notes he is not able to hear out left ear that well. No c/o headaches or dizziness. Staff will continue to monitor and meet needs. Patient slept well throughout the shift.

## 2015-08-28 NOTE — Progress Notes (Signed)
[facility] Progress Note  Patient: [name] ZOX:[id number]   PCP: Oliver Barre, MD DOB: Mar 05, 1925   DOA: 08/26/2015   DOS: 08/28/2015   Date of Service: the patient was seen and examined on 08/28/2015  Subjective: Does not have any acute complaint, no ear pain, no jaw pain, no difficulty swallowing. Complains of occasional dizziness Nutrition: Able to tolerate oral diet Activity: Ambulating in the room Last BM: 08/26/2015  Assessment and Plan: 1. E. coli sepsis [facility]) Patient presented to ER with low WBC, fever, lactic acidosis with fever and right and earache. Blood cultures positive for Escherichia coli 2 sensitive to cefazolin WBC improved, lactic acid improved, urine without evidence of any WBC. Patient does not have any abdominal symptoms at present. Change to cefazolin  2. Hypokalemia and hypomagnesemia. Etiology unclear replacing and rechecking.  3. Chronic kidney disease. Renal function mildly worsened. Continue monitoring.  4. Prior history of colectomy. Prior history of colon cancer. At present patient does not have any abdominal tenderness or intra-abdominal symptom. If patient continues to have bacteremic will need CT of the abdomen to rule out any intra-abdominal source.  DVT Prophylaxis: subcutaneous Heparin Nutrition: Able to tolerate oral diet Advance goals of care discussion: Full code  Brief Summary of Hospitalization:  HPI: As per the H and P dictated on admission, "patient presented with complains of ear ache fever and chills, no abdominal symptoms. Patient was discharged on Keflex and return back to the ER due to ground negative bacteremia" Daily update, Procedures: None Consultants: None Antibiotics: Anti-infectives    Start     Dose/Rate Route Frequency Ordered Stop   08/28/15 1130  ceFAZolin (ANCEF) IVPB 2 g/50 mL premix     2 g 100 mL/hr over 30 Minutes Intravenous Every 12 hours 08/28/15 1041     08/27/15 0330  piperacillin-tazobactam (ZOSYN)  IVPB 3.375 g  Status:  Discontinued     3.375 g 12.5 mL/hr over 240 Minutes Intravenous Every 8 hours 08/26/15 2055 08/28/15 1022   08/27/15 0300  vancomycin (VANCOCIN) 500 mg in sodium chloride 0.9 % 100 mL IVPB  Status:  Discontinued     500 mg 100 mL/hr over 60 Minutes Intravenous Every 24 hours 08/26/15 2052 08/26/15 2157   08/26/15 2100  piperacillin-tazobactam (ZOSYN) IVPB 3.375 g     3.375 g 100 mL/hr over 30 Minutes Intravenous  Once 08/26/15 2049 08/26/15 2208   08/26/15 2100  vancomycin (VANCOCIN) IVPB 1000 mg/200 mL premix  Status:  Discontinued     1,000 mg 200 mL/hr over 60 Minutes Intravenous  Once 08/26/15 2049 08/26/15 2052      Family Communication: family was present at bedside, at the time of interview. All questions answered satisfactorily  Disposition:  Expected discharge date: 08/29/2015 Barriers to safe discharge: Bacteremia resolution   Intake/Output Summary (Last 24 hours) at 08/28/15 1450 Last data filed at 08/28/15 1300  Gross per 24 hour  Intake    650 ml  Output    500 ml  Net    150 ml   Filed Weights   08/27/15 0004  Weight: 57.9 kg (127 lb 10.3 oz)    Objective: Physical Exam: Filed Vitals:   08/28/15 0433 08/28/15 0512 08/28/15 0820 08/28/15 1112  BP: 98/46  153/74   Pulse: 64  67   Temp:  98 F (36.7 C) 98.3 F (36.8 C)   TempSrc:   Oral   Resp: 18  17   Height:      Weight:  SpO2: 95%  100% 98%    No orthostatic drop, General: Appear in mild distress, no Rash; Oral Mucosa moist. Cardiovascular: S1 and S2 Present, no Murmur, no JVD Respiratory: Bilateral Air entry present and Clear to Auscultation, no Crackles, no wheezes Abdomen: Bowel Sound present, Soft and no tenderness Extremities: no Pedal edema, no calf tenderness Neurology: Grossly no focal neuro deficit. Extraocular muscle movement no painful, field of vision present normal  Data Reviewed: CBC:  Recent Labs Lab 08/26/15 0015 08/26/15 1916 08/27/15 0412  08/28/15 0519  WBC 3.7* 4.4 4.9 6.4  NEUTROABS 2.6 2.9  --  4.8  HGB 11.0* 10.0* 9.8* 11.0*  HCT 34.0* 30.4* 30.7* 33.3*  MCV 96.6 96.8 95.6 94.9  PLT 210 185 172 189   Basic Metabolic Panel:  Recent Labs Lab 08/26/15 0015 08/26/15 1916 08/27/15 0412 08/27/15 0945 08/27/15 1718 08/28/15 0519  NA 142 139 140  --  138 139  K 3.2* 3.2* 2.9*  --  3.9 3.5  CL 101 103 107  --  101 102  CO2 26 26 24   --  26 27  GLUCOSE 134* 120* 111*  --  125* 108*  BUN 24* 21* 18  --  16 14  CREATININE 1.86* 1.82* 1.66*  --  1.81* 1.94*  CALCIUM 6.7* 6.6* 6.0*  --  7.1* 6.9*  MG  --   --   --  0.4* 1.2* 1.9   Liver Function Tests:  Recent Labs Lab 08/26/15 0015 08/26/15 1916 08/27/15 0412 08/28/15 0519  AST 20 32 25 22  ALT 9* 13* 10* 11*  ALKPHOS 58 54 46 46  BILITOT 0.5 1.0 0.8 1.0  PROT 7.0 7.0 5.8* 6.4*  ALBUMIN 3.7 3.5 2.8* 2.8*   CBG: No results for input(s): GLUCAP in the last 168 hours.  Recent Results (from the past 240 hour(s))  Blood Culture (routine x 2)     Status: None   Collection Time: 08/26/15  1:50 AM  Result Value Ref Range Status   Specimen Description BLOOD RIGHT ARM  Final   Special Requests AEROBIC BOTTLE ONLY  Final   Culture  Setup Time   Final    GRAM NEGATIVE RODS AEROBIC BOTTLE ONLY CRITICAL RESULT CALLED TO, READ BACK BY AND VERIFIED WITH: L MILLER RN 1621 08/26/15 A BROWNING    Culture ESCHERICHIA COLI  Final   Report Status 08/28/2015 FINAL  Final   Organism ID, Bacteria ESCHERICHIA COLI  Final      Susceptibility   Escherichia coli - MIC*    AMPICILLIN >=32 RESISTANT Resistant     CEFAZOLIN <=4 SENSITIVE Sensitive     CEFEPIME <=1 SENSITIVE Sensitive     CEFTAZIDIME <=1 SENSITIVE Sensitive     CEFTRIAXONE <=1 SENSITIVE Sensitive     CIPROFLOXACIN >=4 RESISTANT Resistant     GENTAMICIN <=1 SENSITIVE Sensitive     IMIPENEM <=0.25 SENSITIVE Sensitive     TRIMETH/SULFA <=20 SENSITIVE Sensitive     AMPICILLIN/SULBACTAM 16 INTERMEDIATE  Intermediate     PIP/TAZO <=4 SENSITIVE Sensitive     * ESCHERICHIA COLI  Blood Culture (routine x 2)     Status: None   Collection Time: 08/26/15  1:55 AM  Result Value Ref Range Status   Specimen Description BLOOD LEFT ARM  Final   Special Requests AEROBIC BOTTLE ONLY  Final   Culture  Setup Time   Final    GRAM NEGATIVE RODS AEROBIC BOTTLE ONLY CRITICAL RESULT CALLED TO, READ BACK BY  AND VERIFIED WITH: L MILLER RN 1621 08/26/15 A BROWNING    Culture   Final    ESCHERICHIA COLI SUSCEPTIBILITIES PERFORMED ON PREVIOUS CULTURE WITHIN THE LAST 5 DAYS.    Report Status 08/28/2015 FINAL  Final  Urine culture     Status: None   Collection Time: 08/26/15  4:14 AM  Result Value Ref Range Status   Specimen Description URINE, RANDOM  Final   Special Requests NONE  Final   Culture 2,000 COLONIES/mL INSIGNIFICANT GROWTH  Final   Report Status 08/27/2015 FINAL  Final  Urine culture     Status: None   Collection Time: 08/26/15 10:05 PM  Result Value Ref Range Status   Specimen Description URINE, CLEAN CATCH  Final   Special Requests NONE  Final   Culture 2,000 COLONIES/mL INSIGNIFICANT GROWTH  Final   Report Status 08/28/2015 FINAL  Final     Studies: No results found.   Scheduled Meds: . amLODipine  10 mg Oral Daily  .  ceFAZolin (ANCEF) IV  2 g Intravenous Q12H  . cloNIDine  0.2 mg Oral BID  . heparin subcutaneous  5,000 Units Subcutaneous 3 times per day  . metoprolol  50 mg Oral BID  . pantoprazole  40 mg Oral Daily  . sodium chloride  3 mL Intravenous Q12H   Continuous Infusions:  PRN Meds: acetaminophen **OR** acetaminophen, ondansetron (ZOFRAN) IV, senna-docusate  Time spent: 30 minutes  Author: Lynden OxfordPranav Alexzandra Bilton, MD [facility] Pager: [phone] 08/28/2015 2:50 PM  If 7PM-7AM, please contact night-coverage at [email or web address], password [email or web address]

## 2015-08-28 NOTE — Progress Notes (Signed)
ANTIBIOTIC CONSULT NOTE - INITIAL  Pharmacy Consult for Ancef Indication: bacteremia  No Known Allergies  Patient Measurements: Height: 5\' 5"  (165.1 cm) Weight: 127 lb 10.3 oz (57.9 kg) IBW/kg (Calculated) : 61.5   Vital Signs: Temp: 98.3 F (36.8 C) (01/08 0820) Temp Source: Oral (01/08 0820) BP: 153/74 mmHg (01/08 0820) Pulse Rate: 67 (01/08 0820) Intake/Output from previous day: 01/07 0701 - 01/08 0700 In: 290 [P.O.:240; IV Piggyback:50] Out: 1400 [Urine:1400] Intake/Output from this shift: Total I/O In: 240 [P.O.:240] Out: 0   Labs:  Recent Labs  08/26/15 1916 08/27/15 0412 08/27/15 1718 08/28/15 0519  WBC 4.4 4.9  --  6.4  HGB 10.0* 9.8*  --  11.0*  PLT 185 172  --  189  CREATININE 1.82* 1.66* 1.81* 1.94*   Estimated Creatinine Clearance: 21.6 mL/min (by C-G formula based on Cr of 1.94). No results for input(s): VANCOTROUGH, VANCOPEAK, VANCORANDOM, GENTTROUGH, GENTPEAK, GENTRANDOM, TOBRATROUGH, TOBRAPEAK, TOBRARND, AMIKACINPEAK, AMIKACINTROU, AMIKACIN in the last 72 hours.   Microbiology: Recent Results (from the past 720 hour(s))  Blood Culture (routine x 2)     Status: None   Collection Time: 08/26/15  1:50 AM  Result Value Ref Range Status   Specimen Description BLOOD RIGHT ARM  Final   Special Requests AEROBIC BOTTLE ONLY 10ML  Final   Culture  Setup Time   Final    GRAM NEGATIVE RODS AEROBIC BOTTLE ONLY CRITICAL RESULT CALLED TO, READ BACK BY AND VERIFIED WITH: L MILLER RN 1621 08/26/15 A BROWNING    Culture ESCHERICHIA COLI  Final   Report Status 08/28/2015 FINAL  Final   Organism ID, Bacteria ESCHERICHIA COLI  Final      Susceptibility   Escherichia coli - MIC*    AMPICILLIN >=32 RESISTANT Resistant     CEFAZOLIN <=4 SENSITIVE Sensitive     CEFEPIME <=1 SENSITIVE Sensitive     CEFTAZIDIME <=1 SENSITIVE Sensitive     CEFTRIAXONE <=1 SENSITIVE Sensitive     CIPROFLOXACIN >=4 RESISTANT Resistant     GENTAMICIN <=1 SENSITIVE Sensitive      IMIPENEM <=0.25 SENSITIVE Sensitive     TRIMETH/SULFA <=20 SENSITIVE Sensitive     AMPICILLIN/SULBACTAM 16 INTERMEDIATE Intermediate     PIP/TAZO <=4 SENSITIVE Sensitive     * ESCHERICHIA COLI  Blood Culture (routine x 2)     Status: None   Collection Time: 08/26/15  1:55 AM  Result Value Ref Range Status   Specimen Description BLOOD LEFT ARM  Final   Special Requests AEROBIC BOTTLE ONLY 10ML  Final   Culture  Setup Time   Final    GRAM NEGATIVE RODS AEROBIC BOTTLE ONLY CRITICAL RESULT CALLED TO, READ BACK BY AND VERIFIED WITH: L MILLER RN 1621 08/26/15 A BROWNING    Culture   Final    ESCHERICHIA COLI SUSCEPTIBILITIES PERFORMED ON PREVIOUS CULTURE WITHIN THE LAST 5 DAYS.    Report Status 08/28/2015 FINAL  Final  Urine culture     Status: None   Collection Time: 08/26/15  4:14 AM  Result Value Ref Range Status   Specimen Description URINE, RANDOM  Final   Special Requests NONE  Final   Culture 2,000 COLONIES/mL INSIGNIFICANT GROWTH  Final   Report Status 08/27/2015 FINAL  Final    Medical History: Past Medical History  Diagnosis Date  . DIABETES MELLITUS, TYPE II 12/24/2007  . HYPERLIPIDEMIA 03/24/2007  . ANEMIA-NOS 06/02/2007  . HEARING LOSS, LEFT EAR 01/18/2010  . HYPERTENSION 03/24/2007  . PULMONARY NODULE 06/02/2007  .  STRICTURE, ESOPHAGEAL 06/02/2007  . GERD 06/02/2007  . GASTROENTERITIS, ACUTE 05/30/2010  . CONSTIPATION 01/18/2010  . CHOLELITHIASIS 06/02/2007  . RENAL INSUFFICIENCY 06/02/2007  . OSTEOARTHRITIS, KNEES, BILATERAL 01/26/2008  . FEVER UNSPECIFIED 01/18/2010  . Abdominal pain, right upper quadrant 06/12/2007  . EPIGASTRIC TENDERNESS 01/18/2010  . COLON CANCER, HX OF 03/24/2007  . NEPHROLITHIASIS, HX OF 06/02/2007    Assessment: 89 YOM admitted 08/26/2015 with chills and 2/2 GNR in blood culture. Previously admitted 1/6 and was discharged before blood culture results resulted.   Day #3 abx for 2/2 GNR bacteremia. WBC wnl, afebrile, LA 0.9   1/6>>Vanc>>1/6   1/6>>Zosyn>>1/8  1/7>>cefepime>>1/7  1/8>>Ancef>>  1/6 BCx x2>>2/2 E. Coli (sens to ancef)  Goal of Therapy:  Eradication of infection   Plan:  Ancef 2g IV Q12h  Zosyn discontinued  F/U c/s, renal fxn, abx de-escalation, LOT   Abiha Lukehart C. Marvis Moeller, PharmD Pharmacy Resident  Pager: [phone] 08/28/2015 10:44 AM

## 2015-08-29 ENCOUNTER — Telehealth (HOSPITAL_BASED_OUTPATIENT_CLINIC_OR_DEPARTMENT_OTHER): Payer: Self-pay | Admitting: Emergency Medicine

## 2015-08-29 ENCOUNTER — Telehealth: Payer: Self-pay

## 2015-08-29 LAB — BASIC METABOLIC PANEL
Anion gap: 11 (ref 5–15)
BUN: 16 mg/dL (ref 6–20)
CHLORIDE: 99 mmol/L — AB (ref 101–111)
CO2: 27 mmol/L (ref 22–32)
Calcium: 7.6 mg/dL — ABNORMAL LOW (ref 8.9–10.3)
Creatinine, Ser: 1.89 mg/dL — ABNORMAL HIGH (ref 0.61–1.24)
GFR calc Af Amer: 35 mL/min — ABNORMAL LOW (ref >60)
GFR calc non Af Amer: 30 mL/min — ABNORMAL LOW (ref >60)
Glucose, Bld: 117 mg/dL — ABNORMAL HIGH (ref 65–99)
POTASSIUM: 3.5 mmol/L (ref 3.5–5.1)
SODIUM: 137 mmol/L (ref 135–145)

## 2015-08-29 MED ORDER — CEPHALEXIN 500 MG PO CAPS: 500.0000 mg | ORAL_CAPSULE | Freq: Four times a day (QID) | ORAL | Status: AC

## 2015-08-29 NOTE — Progress Notes (Signed)
Chase BilletAlfred Emel to be D/C'd Home per MD order.  Discussed prescriptions and follow up appointments with the patient. Prescriptions given to patient, medication list explained in detail. Pt verbalized understanding.    Medication List    STOP taking these medications        hydrochlorothiazide 25 MG tablet  Commonly known as:  HYDRODIURIL      TAKE these medications        amLODipine 10 MG tablet  Commonly known as:  NORVASC  TAKE ONE TABLET BY MOUTH ONCE DAILY     cephALEXin 500 MG capsule  Commonly known as:  KEFLEX  Take 1 capsule (500 mg total) by mouth 4 (four) times daily.     cloNIDine 0.2 MG tablet  Commonly known as:  CATAPRES  TAKE ONE TABLET BY MOUTH TWICE DAILY     metoprolol 50 MG tablet  Commonly known as:  LOPRESSOR  TAKE ONE TABLET BY MOUTH TWICE DAILY     omeprazole 20 MG capsule  Commonly known as:  PRILOSEC  TAKE ONE CAPSULE BY MOUTH ONCE DAILY     traMADol 50 MG tablet  Commonly known as:  ULTRAM  Take 1 tablet (50 mg total) by mouth every 8 (eight) hours as needed.        Filed Vitals:   08/29/15 0500 08/29/15 0908  BP: 110/55 152/76  Pulse: 57 70  Temp: 98.2 F (36.8 C) 97.9 F (36.6 C)  Resp: 18 16    Skin clean, dry and intact without evidence of skin break down, no evidence of skin tears noted. IV catheter discontinued intact. Site without signs and symptoms of complications. Dressing and pressure applied. Pt denies pain at this time. No complaints noted.  An After Visit Summary was printed and given to the patient. Patient escorted via WC, and D/C home via private auto.  Janeann ForehandLuke Randalyn [name] BSN, RN

## 2015-08-29 NOTE — Telephone Encounter (Signed)
Pt is home today from hospital.  Pt dx for Gram Negative bacterial infection. Will be on abx for the next two weeks. Pt spouse stated that pt was unable to speak and she will call back for an appt with PCP after abx are completed.

## 2015-08-30 NOTE — Discharge Summary (Signed)
[facility] Discharge Summary   Patient: [name] ZOX:[id number]   PCP: Oliver Barre, MD DOB: 05-Dec-1924   Date of admission: 08/26/2015   Date of discharge: 08/29/2015    Discharge Diagnoses:  Principal Problem:   E. coli sepsis (HCC) Active Problems:   Essential hypertension   History of malignant neoplasm of large intestine   CKD (chronic kidney disease)   Positive blood culture   Hypokalemia   Hypomagnesemia  Recommendations for Outpatient Follow-up:  1. Please follow up with PCP in 1-2 week   Follow-up Information    Follow up with Oliver Barre, MD. Schedule an appointment as soon as possible for a visit in 2 weeks.   Specialties:  Internal Medicine, Radiology   Why:  Mr. Bufkin needs to call the office for a repeat BMP and hypertension check up. Thank you.   Contact information:   [address] Maggie Schwalbe [facility] [place] [address] [phone]      Diet recommendation: Healthy diet  Activity: The patient is advised to gradually reintroduce usual activities.  Discharge Condition: good  History of present illness: As per the H and P dictated on admission, "patient presented with complains of ear ache fever and chills, no abdominal symptoms. Patient was discharged on Keflex and return back to the ER due to ground negative bacteremia"  Hospital Course:  Summary of his active problems in the hospital is as following. 1. E. coli sepsis [facility]) Patient presented to ER with low WBC, fever, lactic acidosis with fever and right earache. Blood cultures positive for Escherichia coli 2 sensitive to cefazolin. WBC improved, lactic acid improved, urine without evidence of any WBC. Culture showed 2000 colonies insignificant growth. Patient did not have any ongoing complain to identify the source of infection during the hospitalization and improved rapidly with initiation of antibiotic. He may have partially treated UTI based on insignificant growth. Patient does not have any abdominal  symptoms at present. His done this the patient will be discharged on Keflex to complete a total 14 day appropriate antibiotic therapy.  2. Hypokalemia and hypomagnesemia. Etiology unclear Resolved  3. Chronic kidney disease. Renal function remains stable. Next and patient will need a repeat BMP when he has a PCP follow-up.  4. Prior history of colectomy. Prior history of colon cancer. At present patient does not have any abdominal tenderness or intra-abdominal symptom.  5. Essential hypertension. Hydrochlorothiazide was on hold due to renal dysfunction Patient's blood pressure was stable in the hospital despite discontinuation of hydrochlorothiazide. Therefore on discharge the patient's diuretics will be stopped. Continue rest of the medication.  All other chronic medical condition were stable during the hospitalization.  Patient was ambulatory without any assistance. On the day of the discharge the patient's vitals were stable and cultures remain negative, and no other acute medical condition were reported by patient. the patient was felt safe to be discharge at home with family.  Procedures and Results:  none   Consultations:  none  DISCHARGE MEDICATION: Discharge Medication List as of 08/29/2015  1:18 PM    START taking these medications   Details  cephALEXin (KEFLEX) 500 MG capsule Take 1 capsule (500 mg total) by mouth 4 (four) times daily., Starting 08/29/2015, Until Fri 09/09/15, Normal      CONTINUE these medications which have NOT CHANGED   Details  amLODipine (NORVASC) 10 MG tablet TAKE ONE TABLET BY MOUTH ONCE DAILY, Normal    cloNIDine (CATAPRES) 0.2 MG tablet TAKE ONE TABLET BY MOUTH TWICE DAILY,  Normal    metoprolol (LOPRESSOR) 50 MG tablet TAKE ONE TABLET BY MOUTH TWICE DAILY, Normal    omeprazole (PRILOSEC) 20 MG capsule TAKE ONE CAPSULE BY MOUTH ONCE DAILY, Normal    traMADol (ULTRAM) 50 MG tablet Take 1 tablet (50 mg total) by mouth every 8 (eight) hours  as needed., Starting 01/11/2015, Until Discontinued, Print      STOP taking these medications     hydrochlorothiazide (HYDRODIURIL) 25 MG tablet        No Known Allergies Discharge Instructions    Diet - low sodium heart healthy    Complete by:  As directed      Discharge instructions    Complete by:  As directed   It is important that you read following instructions as well as go over your medication list with RN to help you understand your care after this hospitalization.  Discharge Instructions: Please follow up with PCP in 1 week for repeat blood work BMP.   Please request your primary care physician to go over all Hospital Tests and Procedure/Radiological results at the follow up,  Please get all Hospital records sent to your PCP by signing hospital release before you go home.   Do not take more than prescribed Pain, Sleep and Anxiety Medications. You were cared for by a hospitalist during your hospital stay. If you have any questions about your discharge medications or the care you received while you were in the hospital after you are discharged, you can call the unit and ask to speak with the hospitalist on call if the hospitalist that took care of you is not available.  Once you are discharged, your primary care physician will handle any further medical issues. Please note that NO REFILLS for any discharge medications will be authorized once you are discharged, as it is imperative that you return to your primary care physician (or establish a relationship with a primary care physician if you do not have one) for your aftercare needs so that they can reassess your need for medications and monitor your lab values. You Must read complete instructions/literature along with all the possible adverse reactions/side effects for all the Medicines you take and that have been prescribed to you. Take any new Medicines after you have completely understood and accept all the possible adverse  reactions/side effects. Wear Seat belts while driving. If you have smoked or chewed Tobacco in the last 2 yrs please stop smoking and/or stop any Recreational drug use.     Increase activity slowly    Complete by:  As directed           Discharge Exam: Filed Weights   08/27/15 0004 08/28/15 2100  Weight: 57.9 kg (127 lb 10.3 oz) 57.7 kg (127 lb 3.3 oz)   Filed Vitals:   08/29/15 0500 08/29/15 0908  BP: 110/55 152/76  Pulse: 57 70  Temp: 98.2 F (36.8 C) 97.9 F (36.6 C)  Resp: 18 16   General: Appear in no distress, no Rash; Oral Mucosa nomoistnodry. Cardiovascular: S1 and S2 Present, no Murmur, no JVD Respiratory: Bilateral Air entry present and Clear to Auscultation, no Crackles, no wheezes Abdomen: Bowel Sound present, Soft and no tenderness Extremities: no Pedal edema, no calf tenderness Neurology: Grossly no focal neuro deficit.  The results of significant diagnostics from this hospitalization (including imaging, microbiology, ancillary and laboratory) are listed below for reference.    Significant Diagnostic Studies: Dg Chest 2 View  08/26/2015  CLINICAL DATA:  Acute onset  of shaking and tremors. Initial encounter. EXAM: CHEST  2 VIEW COMPARISON:  Chest radiograph performed 12/07/2012 FINDINGS: The lungs are well-aerated and clear. There is no evidence of focal opacification, pleural effusion or pneumothorax. The heart is normal in size; the mediastinal contour is within normal limits. No acute osseous abnormalities are seen. Mild degenerative change is noted at the left glenohumeral joint. Clips are noted within the right upper quadrant, reflecting prior cholecystectomy. IMPRESSION: No acute cardiopulmonary process seen. Electronically Signed   By: Roanna Raider M.D.   On: 08/26/2015 02:50   Microbiology: Recent Results (from the past 240 hour(s))  Blood Culture (routine x 2)     Status: None   Collection Time: 08/26/15  1:50 AM  Result Value Ref Range Status    Specimen Description BLOOD RIGHT ARM  Final   Special Requests AEROBIC BOTTLE ONLY  Final   Culture  Setup Time   Final    GRAM NEGATIVE RODS AEROBIC BOTTLE ONLY CRITICAL RESULT CALLED TO, READ BACK BY AND VERIFIED WITH: L MILLER RN 1621 08/26/15 A BROWNING    Culture ESCHERICHIA COLI  Final   Report Status 08/28/2015 FINAL  Final   Organism ID, Bacteria ESCHERICHIA COLI  Final      Susceptibility   Escherichia coli - MIC*    AMPICILLIN >=32 RESISTANT Resistant     CEFAZOLIN <=4 SENSITIVE Sensitive     CEFEPIME <=1 SENSITIVE Sensitive     CEFTAZIDIME <=1 SENSITIVE Sensitive     CEFTRIAXONE <=1 SENSITIVE Sensitive     CIPROFLOXACIN >=4 RESISTANT Resistant     GENTAMICIN <=1 SENSITIVE Sensitive     IMIPENEM <=0.25 SENSITIVE Sensitive     TRIMETH/SULFA <=20 SENSITIVE Sensitive     AMPICILLIN/SULBACTAM 16 INTERMEDIATE Intermediate     PIP/TAZO <=4 SENSITIVE Sensitive     * ESCHERICHIA COLI  Blood Culture (routine x 2)     Status: None   Collection Time: 08/26/15  1:55 AM  Result Value Ref Range Status   Specimen Description BLOOD LEFT ARM  Final   Special Requests AEROBIC BOTTLE ONLY  Final   Culture  Setup Time   Final    GRAM NEGATIVE RODS AEROBIC BOTTLE ONLY CRITICAL RESULT CALLED TO, READ BACK BY AND VERIFIED WITH: L MILLER RN 1621 08/26/15 A BROWNING    Culture   Final    ESCHERICHIA COLI SUSCEPTIBILITIES PERFORMED ON PREVIOUS CULTURE WITHIN THE LAST 5 DAYS.    Report Status 08/28/2015 FINAL  Final  Urine culture     Status: None   Collection Time: 08/26/15  4:14 AM  Result Value Ref Range Status   Specimen Description URINE, RANDOM  Final   Special Requests NONE  Final   Culture 2,000 COLONIES/mL INSIGNIFICANT GROWTH  Final   Report Status 08/27/2015 FINAL  Final  Urine culture     Status: None   Collection Time: 08/26/15 10:05 PM  Result Value Ref Range Status   Specimen Description URINE, CLEAN CATCH  Final   Special Requests NONE  Final   Culture 2,000  COLONIES/mL INSIGNIFICANT GROWTH  Final   Report Status 08/28/2015 FINAL  Final  Culture, blood (routine x 2)     Status: None (Preliminary result)   Collection Time: 08/28/15  1:15 PM  Result Value Ref Range Status   Specimen Description BLOOD RIGHT ANTECUBITAL  Final   Special Requests BOTTLES DRAWN AEROBIC AND ANAEROBIC 10CC  Final   Culture NO GROWTH 2 DAYS  Final   Report  Status PENDING  Incomplete  Culture, blood (routine x 2)     Status: None (Preliminary result)   Collection Time: 08/28/15  1:30 PM  Result Value Ref Range Status   Specimen Description BLOOD RIGHT HAND  Final   Special Requests BOTTLES DRAWN AEROBIC AND ANAEROBIC 10CC  Final   Culture NO GROWTH 2 DAYS  Final   Report Status PENDING  Incomplete     Labs: CBC:  Recent Labs Lab 08/26/15 0015 08/26/15 1916 08/27/15 0412 08/28/15 0519  WBC 3.7* 4.4 4.9 6.4  NEUTROABS 2.6 2.9  --  4.8  HGB 11.0* 10.0* 9.8* 11.0*  HCT 34.0* 30.4* 30.7* 33.3*  MCV 96.6 96.8 95.6 94.9  PLT 210 185 172 189   Basic Metabolic Panel:  Recent Labs Lab 08/26/15 1916 08/27/15 0412 08/27/15 0945 08/27/15 1718 08/28/15 0519 08/29/15 0827  NA 139 140  --  138 139 137  K 3.2* 2.9*  --  3.9 3.5 3.5  CL 103 107  --  101 102 99*  CO2 26 24  --  26 27 27   GLUCOSE 120* 111*  --  125* 108* 117*  BUN 21* 18  --  16 14 16   CREATININE 1.82* 1.66*  --  1.81* 1.94* 1.89*  CALCIUM 6.6* 6.0*  --  7.1* 6.9* 7.6*  MG  --   --  0.4* 1.2* 1.9  --    Liver Function Tests:  Recent Labs Lab 08/26/15 0015 08/26/15 1916 08/27/15 0412 08/28/15 0519  AST 20 32 25 22  ALT 9* 13* 10* 11*  ALKPHOS 58 54 46 46  BILITOT 0.5 1.0 0.8 1.0  PROT 7.0 7.0 5.8* 6.4*  ALBUMIN 3.7 3.5 2.8* 2.8*   Time spent: 30 minutes  Signed:  PATEL, PRANAV  [facility] 08/29/2015, 3:00 PM

## 2015-09-02 LAB — CULTURE, BLOOD (ROUTINE X 2)
Culture: NO GROWTH
Culture: NO GROWTH

## 2015-09-04 ENCOUNTER — Other Ambulatory Visit: Payer: Self-pay | Admitting: Internal Medicine

## 2015-09-23 ENCOUNTER — Other Ambulatory Visit: Payer: Self-pay | Admitting: Internal Medicine

## 2015-09-23 NOTE — Telephone Encounter (Signed)
Please advise, patient needs refill on medication.  

## 2015-09-30 ENCOUNTER — Ambulatory Visit (INDEPENDENT_AMBULATORY_CARE_PROVIDER_SITE_OTHER): Payer: Commercial Managed Care - HMO | Admitting: Internal Medicine

## 2015-09-30 ENCOUNTER — Other Ambulatory Visit (INDEPENDENT_AMBULATORY_CARE_PROVIDER_SITE_OTHER): Payer: Commercial Managed Care - HMO

## 2015-09-30 ENCOUNTER — Encounter: Payer: Self-pay | Admitting: Internal Medicine

## 2015-09-30 VITALS — BP 142/82 | HR 68 | Temp 98.0°F | Ht 65.0 in | Wt 123.0 lb

## 2015-09-30 DIAGNOSIS — E876 Hypokalemia: Secondary | ICD-10-CM

## 2015-09-30 DIAGNOSIS — I10 Essential (primary) hypertension: Secondary | ICD-10-CM

## 2015-09-30 DIAGNOSIS — R7989 Other specified abnormal findings of blood chemistry: Secondary | ICD-10-CM | POA: Diagnosis not present

## 2015-09-30 DIAGNOSIS — N183 Chronic kidney disease, stage 3 (moderate): Secondary | ICD-10-CM

## 2015-09-30 DIAGNOSIS — A4151 Sepsis due to Escherichia coli [E. coli]: Secondary | ICD-10-CM | POA: Diagnosis not present

## 2015-09-30 LAB — CBC WITH DIFFERENTIAL/PLATELET
BASOS ABS: 0 10*3/uL (ref 0.0–0.1)
Basophils Relative: 0 % (ref 0–1)
Eosinophils Absolute: 0.1 10*3/uL (ref 0.0–0.7)
Eosinophils Relative: 2 % (ref 0–5)
HEMATOCRIT: 35 % — AB (ref 39.0–52.0)
Hemoglobin: 11.5 g/dL — ABNORMAL LOW (ref 13.0–17.0)
LYMPHS ABS: 1 10*3/uL (ref 0.7–4.0)
LYMPHS PCT: 27 % (ref 12–46)
MCH: 31.3 pg (ref 26.0–34.0)
MCHC: 32.9 g/dL (ref 30.0–36.0)
MCV: 95.4 fL (ref 78.0–100.0)
MPV: 9.2 fL (ref 8.6–12.4)
Monocytes Absolute: 0.5 10*3/uL (ref 0.1–1.0)
Monocytes Relative: 13 % — ABNORMAL HIGH (ref 3–12)
Neutro Abs: 2.1 10*3/uL (ref 1.7–7.7)
Neutrophils Relative %: 58 % (ref 43–77)
Platelets: 193 10*3/uL (ref 150–400)
RBC: 3.67 MIL/uL — AB (ref 4.22–5.81)
RDW: 14.2 % (ref 11.5–15.5)
WBC: 3.7 10*3/uL — AB (ref 4.0–10.5)

## 2015-09-30 LAB — URINALYSIS, ROUTINE W REFLEX MICROSCOPIC
Bilirubin Urine: NEGATIVE
Hgb urine dipstick: NEGATIVE
Leukocytes, UA: NEGATIVE
Nitrite: NEGATIVE
PH: 5.5 (ref 5.0–8.0)
SPECIFIC GRAVITY, URINE: 1.025 (ref 1.000–1.030)
URINE GLUCOSE: 250 — AB
UROBILINOGEN UA: 1 (ref 0.0–1.0)

## 2015-09-30 LAB — BASIC METABOLIC PANEL
BUN: 45 mg/dL — ABNORMAL HIGH (ref 6–23)
CALCIUM: 9.1 mg/dL (ref 8.4–10.5)
CHLORIDE: 104 meq/L (ref 96–112)
CO2: 28 mEq/L (ref 19–32)
CREATININE: 2.23 mg/dL — AB (ref 0.40–1.50)
GFR: 35.89 mL/min — AB (ref >60.00)
Glucose, Bld: 141 mg/dL — ABNORMAL HIGH (ref 70–99)
Potassium: 4.7 mEq/L (ref 3.5–5.1)
Sodium: 140 mEq/L (ref 135–145)

## 2015-09-30 NOTE — Progress Notes (Addendum)
Subjective:    Patient ID: [name], male    DOB: [birth year], 82 y.o.   MRN: [id number]  HPI  Here to f/u with family assist, overall doing "ok" s/p recent hospn 1/6 - 1/9 with : Principal Problem:  E. coli sepsis (HCC) Active Problems:  Essential hypertension  History of malignant neoplasm of large intestine  CKD (chronic kidney disease)  Positive blood culture  Hypokalemia  Hypomagnesemia No finished antibx, and has no specific complaints, no source for e coli found apparently, Urine cx neg.  Pt mentions persistent general weakness and small gait instability/off balance more than usual but may be improving albeit slowly.  Has overall lower appetite but no real wt loss.  Has occasional HA but Pt denies new neurological symptoms such as new or facial or extremity weakness or numbness Pt denies chest pain, increased sob or doe, wheezing, orthopnea, PND, increased LE swelling, palpitations, dizziness or syncope.  Pt denies new neurological symptoms such as new headache, or facial or extremity weakness or numbness   Pt denies polydipsia, polyuria.  No falls post d/c.  No other specific complaints and Denies urinary symptoms such as dysuria, frequency, urgency, flank pain, hematuria or n/v, fever, chills.  Denies worsening reflux, abd pain, dysphagia, n/v, bowel change or blood, though did have some mild epigastric discomfort last wk he has had intermittent for several yrs. Past Medical History  Diagnosis Date  . DIABETES MELLITUS, TYPE II 12/24/2007  . HYPERLIPIDEMIA 03/24/2007  . ANEMIA-NOS 06/02/2007  . HEARING LOSS, LEFT EAR 01/18/2010  . HYPERTENSION 03/24/2007  . PULMONARY NODULE 06/02/2007  . STRICTURE, ESOPHAGEAL 06/02/2007  . GERD 06/02/2007  . GASTROENTERITIS, ACUTE 05/30/2010  . CONSTIPATION 01/18/2010  . CHOLELITHIASIS 06/02/2007  . RENAL INSUFFICIENCY 06/02/2007  . OSTEOARTHRITIS, KNEES, BILATERAL 01/26/2008  . FEVER UNSPECIFIED 01/18/2010  . Abdominal pain, right upper quadrant  06/12/2007  . EPIGASTRIC TENDERNESS 01/18/2010  . COLON CANCER, HX OF 03/24/2007  . NEPHROLITHIASIS, HX OF 06/02/2007   Past Surgical History  Procedure Laterality Date  . Small intestine surgery      reports that he has quit smoking. He does not have any smokeless tobacco history on file. He reports that he does not drink alcohol or use illicit drugs. family history includes Hypertension in his other. No Known Allergies Current Outpatient Prescriptions on File Prior to Visit  Medication Sig Dispense Refill  . amLODipine (NORVASC) 10 MG tablet TAKE ONE TABLET BY MOUTH ONCE DAILY 90 tablet 2  . cloNIDine (CATAPRES) 0.2 MG tablet TAKE ONE TABLET BY MOUTH TWICE DAILY 60 tablet 11  . hydrochlorothiazide (HYDRODIURIL) 25 MG tablet TAKE ONE TABLET BY MOUTH DAILY. 90 tablet 0  . metoprolol (LOPRESSOR) 50 MG tablet TAKE ONE TABLET BY MOUTH TWICE DAILY 60 tablet 5  . omeprazole (PRILOSEC) 20 MG capsule TAKE ONE CAPSULE BY MOUTH ONCE DAILY 90 capsule 1  . traMADol (ULTRAM) 50 MG tablet Take 1 tablet (50 mg total) by mouth every 8 (eight) hours as needed. 60 tablet 1   No current facility-administered medications on file prior to visit.   Review of Systems  Constitutional: Negative for unusual diaphoresis or night sweats HENT: Negative for ringing in ear or discharge Eyes: Negative for double vision or worsening visual disturbance.  Respiratory: Negative for choking and stridor.   Gastrointestinal: Negative for vomiting or other signifcant bowel change Genitourinary: Negative for hematuria or change in urine volume.  Musculoskeletal: Negative for other MSK pain or swelling Skin: Negative for color change  and worsening wound.  Neurological: Negative for tremors and numbness other than noted  Psychiatric/Behavioral: Negative for decreased concentration or agitation other than above       Objective:   Physical Exam BP 142/82 mmHg  Pulse 68  Temp(Src) 98 F (36.7 C) (Oral)  Ht  (1.651 m)   Wt 123 lb (55.792 kg)  BMI 20.47 kg/m2  SpO2 98% VS noted,  Constitutional: Pt appears in no significant distress HENT: Head: NCAT.  Right Ear: External ear normal.  Left Ear: External ear normal.  Eyes: . Pupils are equal, round, and reactive to light. Conjunctivae and EOM are normal Neck: Normal range of motion. Neck supple.  Cardiovascular: Normal rate and regular rhythm.   Pulmonary/Chest: Effort normal and breath sounds without rales or wheezing.  Abd:  Soft, NT, ND, + BS Neurological: Pt is alert. Not confused , motor grossly intact Skin: Skin is warm. No rash, no LE edema Psychiatric: Pt behavior is normal. No agitation.     Assessment & Plan:

## 2015-09-30 NOTE — Progress Notes (Signed)
Pre visit review using our clinic review tool, if applicable. No additional management support is needed unless otherwise documented below in the visit note. 

## 2015-09-30 NOTE — Patient Instructions (Addendum)
Please continue all other medications as before, and refills have been done if requested.  Please have the pharmacy call with any other refills you may need.  Please re-start the Ensure at 2 cans per day between meals  You are otherwise up to date with prevention measures today.  Please keep your appointments with your specialists as you may have planned  Please go to the LAB in the Basement (turn left off the elevator) for the tests to be done today  You will be contacted by phone if any changes need to be made immediately.  Otherwise, you will receive a letter about your results with an explanation, but please check with MyChart first.  Please remember to sign up for MyChart if you have not done so, as this will be important to you in the future with finding out test results, communicating by private email, and scheduling acute appointments online when needed.

## 2015-10-01 NOTE — Assessment & Plan Note (Signed)
For f/u lytes today as well,  to f/u any worsening symptoms or concerns, may need K replacement on HCT,  Lab Results  Component Value Date   WBC 3.7* 09/30/2015   HGB 11.5* 09/30/2015   HCT 35.0* 09/30/2015   PLT 193 09/30/2015   GLUCOSE 117* 08/29/2015   CHOL 107 01/11/2015   TRIG 102.0 01/11/2015   HDL 37.70* 01/11/2015   LDLCALC 49 01/11/2015   ALT 11* 08/28/2015   AST 22 08/28/2015   NA 137 08/29/2015   K 3.5 08/29/2015   CL 99* 08/29/2015   CREATININE 1.89* 08/29/2015   BUN 16 08/29/2015   CO2 27 08/29/2015   TSH 2.24 01/11/2015   PSA 1.21 Test Methodology: [employer] PSA 05/20/2007   INR 1.1 05/15/2007   HGBA1C 6.0 01/11/2015   MICROALBUR 77.2* 01/11/2015

## 2015-10-01 NOTE — Assessment & Plan Note (Signed)
stable overall by history and exam, recent data reviewed with pt, and pt to continue medical treatment as before,  to f/u any worsening symptoms or concerns BP Readings from Last 3 Encounters:  09/30/15 142/82  08/29/15 152/76  08/26/15 185/90

## 2015-10-01 NOTE — Assessment & Plan Note (Signed)
Resolved, source unclear, for f/u UA and cbc today, but o/w seems to have typical post illness fatigue and minor gait instability that may take further time than usual for improvement due to advanced age, does not appear to need PT at this time, pt declines.

## 2015-10-01 NOTE — Assessment & Plan Note (Signed)
stable overall by history and exam, recent data reviewed with pt, and pt to continue medical treatment as before,  to f/u any worsening symptoms or concerns Lab Results  Component Value Date   CREATININE 1.89* 08/29/2015

## 2015-10-03 ENCOUNTER — Encounter: Payer: Self-pay | Admitting: Internal Medicine

## 2015-10-03 LAB — URINE CULTURE

## 2015-12-08 ENCOUNTER — Other Ambulatory Visit: Payer: Self-pay

## 2015-12-08 MED ORDER — HYDROCHLOROTHIAZIDE 25 MG PO TABS: 25.0000 mg | ORAL_TABLET | Freq: Every day | ORAL | Status: DC

## 2015-12-08 MED ORDER — OMEPRAZOLE 20 MG PO CPDR: 20.0000 mg | DELAYED_RELEASE_CAPSULE | Freq: Every day | ORAL | Status: DC

## 2015-12-13 ENCOUNTER — Ambulatory Visit (INDEPENDENT_AMBULATORY_CARE_PROVIDER_SITE_OTHER): Payer: Commercial Managed Care - HMO | Admitting: Internal Medicine

## 2015-12-13 ENCOUNTER — Encounter: Payer: Self-pay | Admitting: Internal Medicine

## 2015-12-13 VITALS — BP 140/80 | HR 100 | Temp 98.3°F | Resp 20 | Wt 123.0 lb

## 2015-12-13 DIAGNOSIS — E119 Type 2 diabetes mellitus without complications: Secondary | ICD-10-CM

## 2015-12-13 DIAGNOSIS — E785 Hyperlipidemia, unspecified: Secondary | ICD-10-CM

## 2015-12-13 DIAGNOSIS — R6889 Other general symptoms and signs: Secondary | ICD-10-CM | POA: Diagnosis not present

## 2015-12-13 DIAGNOSIS — N183 Chronic kidney disease, stage 3 (moderate): Secondary | ICD-10-CM | POA: Diagnosis not present

## 2015-12-13 DIAGNOSIS — I10 Essential (primary) hypertension: Secondary | ICD-10-CM | POA: Diagnosis not present

## 2015-12-13 DIAGNOSIS — Z0001 Encounter for general adult medical examination with abnormal findings: Secondary | ICD-10-CM | POA: Diagnosis not present

## 2015-12-13 MED ORDER — METOPROLOL TARTRATE 50 MG PO TABS: 50.0000 mg | ORAL_TABLET | Freq: Two times a day (BID) | ORAL | Status: DC

## 2015-12-13 MED ORDER — CLONIDINE HCL 0.2 MG PO TABS: 0.2000 mg | ORAL_TABLET | Freq: Two times a day (BID) | ORAL | Status: AC

## 2015-12-13 MED ORDER — TRAMADOL HCL 50 MG PO TABS: 50.0000 mg | ORAL_TABLET | Freq: Three times a day (TID) | ORAL | Status: DC | PRN

## 2015-12-13 MED ORDER — OMEPRAZOLE 20 MG PO CPDR: 20.0000 mg | DELAYED_RELEASE_CAPSULE | Freq: Every day | ORAL | Status: AC

## 2015-12-13 MED ORDER — AMLODIPINE BESYLATE 10 MG PO TABS: 10.0000 mg | ORAL_TABLET | Freq: Every day | ORAL | Status: AC

## 2015-12-13 MED ORDER — HYDROCHLOROTHIAZIDE 25 MG PO TABS: 25.0000 mg | ORAL_TABLET | Freq: Every day | ORAL | Status: DC

## 2015-12-13 NOTE — Progress Notes (Signed)
Pre visit review using our clinic review tool, if applicable. No additional management support is needed unless otherwise documented below in the visit note. 

## 2015-12-13 NOTE — Assessment & Plan Note (Signed)
stable overall by history and exam, recent data reviewed with pt, and pt to continue medical treatment as before,  to f/u any worsening symptoms or concerns Lab Results  Component Value Date   CREATININE 2.23* 09/30/2015  for f/u lab

## 2015-12-13 NOTE — Assessment & Plan Note (Addendum)
stable overall by history and exam, recent data reviewed with pt, and pt to continue medical treatment as before,  to f/u any worsening symptoms or concerns Lab Results  Component Value Date   HGBA1C 6.0 01/11/2015   In addition to the time spent performing CPE, I spent an additional 15 minutes face to face,in which greater than 50% of this time was spent in counseling and coordination of care for patient's acute illness as documented.

## 2015-12-13 NOTE — Assessment & Plan Note (Signed)
stable overall by history and exam, recent data reviewed with pt, and pt to continue medical treatment as before,  to f/u any worsening symptoms or concerns BP Readings from Last 3 Encounters:  12/13/15 140/80  09/30/15 142/82  08/29/15 152/76

## 2015-12-13 NOTE — Assessment & Plan Note (Signed)

## 2015-12-13 NOTE — Patient Instructions (Signed)
Please continue all other medications as before, and refills have been done if requested.  Please have the pharmacy call with any other refills you may need.  Please continue your efforts at being more active, low cholesterol diet, and weight control.  You are otherwise up to date with prevention measures today.  Please keep your appointments with your specialists as you may have planned  Please go to the LAB in the Basement (turn left off the elevator) for the tests to be done tomorrow or when you can  You will be contacted by phone if any changes need to be made immediately.  Otherwise, you will receive a letter about your results with an explanation, but please check with MyChart first.  Please remember to sign up for MyChart if you have not done so, as this will be important to you in the future with finding out test results, communicating by private email, and scheduling acute appointments online when needed.  You are given the Parking Application signed today

## 2015-12-13 NOTE — Assessment & Plan Note (Signed)
stable overall by history and exam, recent data reviewed with pt, and pt to continue medical treatment as before,  to f/u any worsening symptoms or concerns Lab Results  Component Value Date   LDLCALC 49 01/11/2015

## 2015-12-13 NOTE — Progress Notes (Signed)
Subjective:    Patient ID: [name], male    DOB: 1924/09/22, 82 y.o.   MRN: [id number]  HPI  Here for wellness and f/u;  Overall doing ok;  Pt denies Chest pain, worsening SOB, DOE, wheezing, orthopnea, PND, worsening LE edema, palpitations, dizziness or syncope.  Pt denies neurological change such as new headache, facial or extremity weakness.  Pt denies polydipsia, polyuria, or low sugar symptoms. Pt states overall good compliance with treatment and medications, good tolerability, and has been trying to follow appropriate diet.  Pt denies worsening depressive symptoms, suicidal ideation or panic. No fever, night sweats, wt loss, loss of appetite, or other constitutional symptoms.  Pt states good ability with ADL's, has low fall risk, home safety reviewed and adequate, no other significant changes in hearing or vision, and only occasionally active with exercise.  Denies urinary symptoms such as dysuria, frequency, urgency, flank pain, hematuria or n/v, fever, chills. Denies worsening reflux, abd pain, dysphagia, n/v, bowel change or blood. Past Medical History  Diagnosis Date  . DIABETES MELLITUS, TYPE II 12/24/2007  . HYPERLIPIDEMIA 03/24/2007  . ANEMIA-NOS 06/02/2007  . HEARING LOSS, LEFT EAR 01/18/2010  . HYPERTENSION 03/24/2007  . PULMONARY NODULE 06/02/2007  . STRICTURE, ESOPHAGEAL 06/02/2007  . GERD 06/02/2007  . GASTROENTERITIS, ACUTE 05/30/2010  . CONSTIPATION 01/18/2010  . CHOLELITHIASIS 06/02/2007  . RENAL INSUFFICIENCY 06/02/2007  . OSTEOARTHRITIS, KNEES, BILATERAL 01/26/2008  . FEVER UNSPECIFIED 01/18/2010  . Abdominal pain, right upper quadrant 06/12/2007  . EPIGASTRIC TENDERNESS 01/18/2010  . COLON CANCER, HX OF 03/24/2007  . NEPHROLITHIASIS, HX OF 06/02/2007   Past Surgical History  Procedure Laterality Date  . Small intestine surgery      reports that he has quit smoking. He does not have any smokeless tobacco history on file. He reports that he does not drink alcohol or use  illicit drugs. family history includes Hypertension in his other. No Known Allergies No current outpatient prescriptions on file prior to visit.   No current facility-administered medications on file prior to visit.   Review of Systems Constitutional: Negative for increased diaphoresis, or other activity, appetite or siginficant weight change other than noted HENT: Negative for worsening hearing loss, ear pain, facial swelling, mouth sores and neck stiffness.   Eyes: Negative for other worsening pain, redness or visual disturbance.  Respiratory: Negative for choking or stridor Cardiovascular: Negative for other chest pain and palpitations.  Gastrointestinal: Negative for worsening diarrhea, blood in stool, or abdominal distention Genitourinary: Negative for hematuria, flank pain or change in urine volume.  Musculoskeletal: Negative for myalgias or other joint complaints.  Skin: Negative for other color change and wound or drainage.  Neurological: Negative for syncope and numbness. other than noted Hematological: Negative for adenopathy. or other swelling Psychiatric/Behavioral: Negative for hallucinations, SI, self-injury, decreased concentration or other worsening agitation.      Objective:   Physical Exam BP 140/80 mmHg  Pulse 100  Temp(Src) 98.3 F (36.8 C) (Oral)  Resp 20  Wt 123 lb (55.792 kg)  SpO2 98% VS noted,  Constitutional: Pt is oriented to person, place, and time. Appears well-developed and well-nourished, in no significant distress Head: Normocephalic and atraumatic  Eyes: Conjunctivae and EOM are normal. Pupils are equal, round, and reactive to light Right Ear: External ear normal.  Left Ear: External ear normal Nose: Nose normal.  Mouth/Throat: Oropharynx is clear and moist  Neck: Normal range of motion. Neck supple. No JVD present. No tracheal deviation present or significant neck LA  or mass Cardiovascular: Normal rate, regular rhythm, normal heart sounds and  intact distal pulses.   Pulmonary/Chest: Effort normal and breath sounds without rales or wheezing  Abdominal: Soft. Bowel sounds are normal. NT. No HSM  Musculoskeletal: Normal range of motion. Exhibits no edema Lymphadenopathy: Has no cervical adenopathy.  Neurological: Pt is alert and oriented to person, place, and time. Pt has normal reflexes. No cranial nerve deficit. Motor grossly intact Skin: Skin is warm and dry. No rash noted or new ulcers Psychiatric:  Has normal mood and affect. Behavior is normal.     Assessment & Plan:

## 2015-12-28 ENCOUNTER — Observation Stay (HOSPITAL_COMMUNITY)
Admission: EM | Admit: 2015-12-28 | Discharge: 2015-12-31 | Disposition: A | Discharge status: Home / Self Care | Payer: Commercial Managed Care - HMO | Attending: Internal Medicine | Admitting: Internal Medicine

## 2015-12-28 ENCOUNTER — Emergency Department (HOSPITAL_COMMUNITY): Payer: Commercial Managed Care - HMO

## 2015-12-28 ENCOUNTER — Encounter (HOSPITAL_COMMUNITY): Payer: Self-pay | Admitting: Emergency Medicine

## 2015-12-28 DIAGNOSIS — M170 Bilateral primary osteoarthritis of knee: Secondary | ICD-10-CM | POA: Insufficient documentation

## 2015-12-28 DIAGNOSIS — E43 Unspecified severe protein-calorie malnutrition: Secondary | ICD-10-CM | POA: Insufficient documentation

## 2015-12-28 DIAGNOSIS — E876 Hypokalemia: Secondary | ICD-10-CM | POA: Diagnosis present

## 2015-12-28 DIAGNOSIS — N183 Chronic kidney disease, stage 3 (moderate): Secondary | ICD-10-CM | POA: Insufficient documentation

## 2015-12-28 DIAGNOSIS — Z85038 Personal history of other malignant neoplasm of large intestine: Secondary | ICD-10-CM | POA: Diagnosis not present

## 2015-12-28 DIAGNOSIS — Z87891 Personal history of nicotine dependence: Secondary | ICD-10-CM | POA: Diagnosis not present

## 2015-12-28 DIAGNOSIS — Z6821 Body mass index (BMI) 21.0-21.9, adult: Secondary | ICD-10-CM | POA: Diagnosis not present

## 2015-12-28 DIAGNOSIS — G9389 Other specified disorders of brain: Secondary | ICD-10-CM | POA: Insufficient documentation

## 2015-12-28 DIAGNOSIS — E785 Hyperlipidemia, unspecified: Secondary | ICD-10-CM | POA: Diagnosis not present

## 2015-12-28 DIAGNOSIS — I4581 Long QT syndrome: Secondary | ICD-10-CM | POA: Insufficient documentation

## 2015-12-28 DIAGNOSIS — K219 Gastro-esophageal reflux disease without esophagitis: Secondary | ICD-10-CM | POA: Insufficient documentation

## 2015-12-28 DIAGNOSIS — E1122 Type 2 diabetes mellitus with diabetic chronic kidney disease: Principal | ICD-10-CM | POA: Insufficient documentation

## 2015-12-28 DIAGNOSIS — Z9119 Patient's noncompliance with other medical treatment and regimen: Secondary | ICD-10-CM | POA: Insufficient documentation

## 2015-12-28 DIAGNOSIS — I129 Hypertensive chronic kidney disease with stage 1 through stage 4 chronic kidney disease, or unspecified chronic kidney disease: Secondary | ICD-10-CM | POA: Insufficient documentation

## 2015-12-28 DIAGNOSIS — R079 Chest pain, unspecified: Secondary | ICD-10-CM | POA: Diagnosis present

## 2015-12-28 DIAGNOSIS — I160 Hypertensive urgency: Secondary | ICD-10-CM | POA: Diagnosis present

## 2015-12-28 DIAGNOSIS — E878 Other disorders of electrolyte and fluid balance, not elsewhere classified: Secondary | ICD-10-CM | POA: Diagnosis not present

## 2015-12-28 DIAGNOSIS — I10 Essential (primary) hypertension: Secondary | ICD-10-CM | POA: Diagnosis present

## 2015-12-28 DIAGNOSIS — Z87442 Personal history of urinary calculi: Secondary | ICD-10-CM | POA: Insufficient documentation

## 2015-12-28 DIAGNOSIS — R197 Diarrhea, unspecified: Secondary | ICD-10-CM | POA: Insufficient documentation

## 2015-12-28 DIAGNOSIS — R42 Dizziness and giddiness: Secondary | ICD-10-CM | POA: Insufficient documentation

## 2015-12-28 DIAGNOSIS — R634 Abnormal weight loss: Secondary | ICD-10-CM | POA: Diagnosis not present

## 2015-12-28 DIAGNOSIS — R51 Headache: Secondary | ICD-10-CM | POA: Diagnosis not present

## 2015-12-28 DIAGNOSIS — R0602 Shortness of breath: Secondary | ICD-10-CM | POA: Insufficient documentation

## 2015-12-28 DIAGNOSIS — Z7982 Long term (current) use of aspirin: Secondary | ICD-10-CM | POA: Insufficient documentation

## 2015-12-28 DIAGNOSIS — N181 Chronic kidney disease, stage 1: Secondary | ICD-10-CM

## 2015-12-28 DIAGNOSIS — N189 Chronic kidney disease, unspecified: Secondary | ICD-10-CM | POA: Insufficient documentation

## 2015-12-28 DIAGNOSIS — E119 Type 2 diabetes mellitus without complications: Secondary | ICD-10-CM

## 2015-12-28 LAB — BASIC METABOLIC PANEL
ANION GAP: 15 (ref 5–15)
BUN: 20 mg/dL (ref 6–20)
CALCIUM: 6.4 mg/dL — AB (ref 8.9–10.3)
CO2: 25 mmol/L (ref 22–32)
Chloride: 102 mmol/L (ref 101–111)
Creatinine, Ser: 1.77 mg/dL — ABNORMAL HIGH (ref 0.61–1.24)
GFR, EST AFRICAN AMERICAN: 38 mL/min — AB (ref >60)
GFR, EST NON AFRICAN AMERICAN: 33 mL/min — AB (ref >60)
GLUCOSE: 109 mg/dL — AB (ref 65–99)
POTASSIUM: 3.1 mmol/L — AB (ref 3.5–5.1)
Sodium: 142 mmol/L (ref 135–145)

## 2015-12-28 LAB — CBC
HEMATOCRIT: 36.1 % — AB (ref 39.0–52.0)
HEMOGLOBIN: 11.6 g/dL — AB (ref 13.0–17.0)
MCH: 31.2 pg (ref 26.0–34.0)
MCHC: 32.1 g/dL (ref 30.0–36.0)
MCV: 97 fL (ref 78.0–100.0)
Platelets: 257 10*3/uL (ref 150–400)
RBC: 3.72 MIL/uL — AB (ref 4.22–5.81)
RDW: 13.1 % (ref 11.5–15.5)
WBC: 7.1 10*3/uL (ref 4.0–10.5)

## 2015-12-28 LAB — I-STAT TROPONIN, ED: TROPONIN I, POC: 0.05 ng/mL (ref 0.00–0.08)

## 2015-12-28 MED ORDER — LABETALOL HCL 5 MG/ML IV SOLN
10.0000 mg | Freq: Once | INTRAVENOUS | Status: AC
  Administered 2015-12-28: 10 mg via INTRAVENOUS
  Filled 2015-12-28: qty 4

## 2015-12-28 MED ORDER — ASPIRIN 81 MG PO CHEW
324.0000 mg | CHEWABLE_TABLET | Freq: Once | ORAL | Status: AC
  Administered 2015-12-28: 324 mg via ORAL
  Filled 2015-12-28: qty 4

## 2015-12-28 MED ORDER — NITROGLYCERIN 0.4 MG SL SUBL: 0.4000 mg | SUBLINGUAL_TABLET | SUBLINGUAL | Status: DC | PRN

## 2015-12-28 NOTE — ED Notes (Signed)
Patient transported to X-ray 

## 2015-12-28 NOTE — ED Provider Notes (Signed)
CSN: [id number]     Arrival date & time 12/28/15  2048 History   First MD Initiated Contact with Patient 12/28/15 2118     Chief Complaint  Patient presents with  . Chest Pain  . Hypertension  . Dizziness     (Consider location/radiation/quality/duration/timing/severity/associated sxs/prior Treatment) HPI  82 year old male with history of DM, HTN, renal insufficiency, presents with chest pain, lightheadedness and intermittent headache. Started about 1 hour prior to arrival. No dyspnea. Chest pain feels like a "thumping" and is described as mild. Felt like he was going to pass out while watching TV. His vision got dark. No room-spinning or feeling off balance. Intermittent headache during this time, none now. Chest pain is middle of chest. No weakness/numbness. No neck pain or back pain. Took his BP meds today but missed 2-3 days worth when his Rx ran out recently.  Past Medical History  Diagnosis Date  . DIABETES MELLITUS, TYPE II 12/24/2007  . HYPERLIPIDEMIA 03/24/2007  . ANEMIA-NOS 06/02/2007  . HEARING LOSS, LEFT EAR 01/18/2010  . HYPERTENSION 03/24/2007  . PULMONARY NODULE 06/02/2007  . STRICTURE, ESOPHAGEAL 06/02/2007  . GERD 06/02/2007  . GASTROENTERITIS, ACUTE 05/30/2010  . CONSTIPATION 01/18/2010  . CHOLELITHIASIS 06/02/2007  . RENAL INSUFFICIENCY 06/02/2007  . OSTEOARTHRITIS, KNEES, BILATERAL 01/26/2008  . FEVER UNSPECIFIED 01/18/2010  . Abdominal pain, right upper quadrant 06/12/2007  . EPIGASTRIC TENDERNESS 01/18/2010  . COLON CANCER, HX OF 03/24/2007  . NEPHROLITHIASIS, HX OF 06/02/2007   Past Surgical History  Procedure Laterality Date  . Small intestine surgery     Family History  Problem Relation Age of Onset  . Hypertension Other    Social History  Substance Use Topics  . Smoking status: Former Games developer  . Smokeless tobacco: None  . Alcohol Use: No    Review of Systems  Constitutional: Negative for fever.  Respiratory: Negative for shortness of breath.    Cardiovascular: Positive for chest pain.  Gastrointestinal: Negative for vomiting and abdominal pain.  Musculoskeletal: Negative for back pain.  Neurological: Positive for light-headedness and headaches. Negative for syncope.  All other systems reviewed and are negative.     Allergies  Review of patient's allergies indicates no known allergies.  Home Medications   Prior to Admission medications   Medication Sig Start Date End Date Taking? Authorizing Provider  cloNIDine (CATAPRES) 0.2 MG tablet Take 1 tablet (0.2 mg total) by mouth 2 (two) times daily. 12/13/15  Yes Corwin Levins, MD  hydrochlorothiazide (HYDRODIURIL) 25 MG tablet Take 1 tablet (25 mg total) by mouth daily. 12/13/15  Yes Corwin Levins, MD  metoprolol (LOPRESSOR) 50 MG tablet Take 1 tablet (50 mg total) by mouth 2 (two) times daily. 12/13/15  Yes Corwin Levins, MD  omeprazole (PRILOSEC) 20 MG capsule Take 1 capsule (20 mg total) by mouth daily. 12/13/15  Yes Corwin Levins, MD  traMADol (ULTRAM) 50 MG tablet Take 1 tablet (50 mg total) by mouth every 8 (eight) hours as needed. 12/13/15  Yes Corwin Levins, MD  amLODipine (NORVASC) 10 MG tablet Take 1 tablet (10 mg total) by mouth daily. 12/13/15   Corwin Levins, MD   BP 229/119 mmHg  Pulse 91  Temp(Src) 98.3 F (36.8 C) (Oral)  Resp 18  Ht 5\' 5"  (1.651 m)  SpO2 100% Physical Exam  Constitutional: He is oriented to person, place, and time. He appears well-developed and well-nourished. No distress.  HENT:  Head: Normocephalic and atraumatic.  Right Ear: External  ear normal.  Left Ear: External ear normal.  Nose: Nose normal.  Eyes: EOM are normal. Pupils are equal, round, and reactive to light. Right eye exhibits no discharge. Left eye exhibits no discharge.  Neck: Normal range of motion. Neck supple.  Cardiovascular: Normal rate, regular rhythm, normal heart sounds and intact distal pulses.   Pulses:      Radial pulses are 2+ on the right side, and 2+ on the left side.   Pulmonary/Chest: Effort normal and breath sounds normal.  Abdominal: Soft. There is no tenderness.  Musculoskeletal: He exhibits no edema.  Neurological: He is alert and oriented to person, place, and time.  CN 2-12 grossly intact. 5/5 strength in all 4 extremities. Grossly normal sensation. Normal finger to nose  Skin: Skin is warm and dry. He is not diaphoretic.  Nursing note and vitals reviewed.   ED Course  Procedures (including critical care time) Labs Review Labs Reviewed  BASIC METABOLIC PANEL - Abnormal; Notable for the following:    Potassium 3.1 (*)    Glucose, Bld 109 (*)    Creatinine, Ser 1.77 (*)    Calcium 6.4 (*)    GFR calc non Af Amer 33 (*)    GFR calc Af Amer 38 (*)    All other components within normal limits  CBC - Abnormal; Notable for the following:    RBC 3.72 (*)    Hemoglobin 11.6 (*)    HCT 36.1 (*)    All other components within normal limits  I-STAT TROPOININ, ED    Imaging Review Dg Chest 2 View  12/28/2015  CLINICAL DATA:  82 year old male with shortness of breath, chest pain, and dizziness EXAM: CHEST  2 VIEW COMPARISON:  Chest radiograph dated 08/26/2015 FINDINGS: Two views of the chest do not demonstrate a focal consolidation. There is no pleural effusion or pneumothorax. A 7 mm nodular appearing density in the right lung base on the frontal projection most likely represents a nipple shadow. The cardiac silhouette is within normal limits. There are degenerative changes of the spine and shoulders. No acute fracture. IMPRESSION: No active cardiopulmonary disease. Electronically Signed   By: Elgie CollardArash  Radparvar M.D.   On: 12/28/2015 21:50   Ct Head Wo Contrast  12/28/2015  CLINICAL DATA:  Central chest pain and dyspnea. Hypertensive. Headache. EXAM: CT HEAD WITHOUT CONTRAST TECHNIQUE: Contiguous axial images were obtained from the base of the skull through the vertex without intravenous contrast. COMPARISON:  12/07/2012 FINDINGS: There is no  intracranial hemorrhage, mass or evidence of acute infarction. There is moderate generalized atrophy. There is moderate chronic microvascular ischemic change. There is encephalomalacia due to prior infarction in the left cerebellar hemisphere. There is no significant extra-axial fluid collection. No acute intracranial findings are evident. The calvarium and skullbase are intact. The visible paranasal sinuses and orbits are unremarkable. IMPRESSION: No acute intracranial findings. There is moderate generalized atrophy and chronic appearing white matter hypodensities which likely represent small vessel ischemic disease. Remote left cerebellar hemisphere infarction. Electronically Signed   By: Ellery Plunkaniel R Mitchell M.D.   On: 12/28/2015 23:02   I have personally reviewed and evaluated these images and lab results as part of my medical decision-making.   EKG Interpretation   Date/Time:  Wednesday Dec 28 2015 20:56:47 EDT Ventricular Rate:  95 PR Interval:  148 QRS Duration: 70 QT Interval:  354 QTC Calculation: 444 R Axis:   -49 Text Interpretation:  Normal sinus rhythm Left axis deviation Nonspecific  ST and T wave  abnormality Abnormal ECG nonspecific ST/T waves similar to  January Confirmed by [facility] MD, Dajour Pierpoint [phone]) on 12/28/2015 9:16:53 PM      MDM   Final diagnoses:  Hypertensive urgency  Nonspecific chest pain    Patient's CP resolved without treatment here. However he is still quite hypertensive. Appears well. Exam unremarkable. Nonspecific intermittent headache, given acute onset and association with HTN a CT was obtained but is negative. Do not feel further workup needed. Initial ECG unchanged and initial trop negative but obtained within a couple hours of onset. Will treat BP with IV labetalol and admit overnight to hospitalist for BP control and serial troponins. Dr. Adela Glimpse to admit.    Pricilla Loveless, MD 12/29/15 [phone]

## 2015-12-28 NOTE — ED Notes (Signed)
Pt. reports central chest pain with SOB , cough and dizziness onset today , denies nausea or diaphoresis . Hypertensive at triage .

## 2015-12-28 NOTE — ED Notes (Signed)
Critical Calcium 6.4 per lab

## 2015-12-28 NOTE — ED Notes (Signed)
RN notified of hypertension

## 2015-12-29 ENCOUNTER — Observation Stay (HOSPITAL_COMMUNITY): Payer: Commercial Managed Care - HMO

## 2015-12-29 ENCOUNTER — Observation Stay (HOSPITAL_BASED_OUTPATIENT_CLINIC_OR_DEPARTMENT_OTHER): Payer: Commercial Managed Care - HMO

## 2015-12-29 ENCOUNTER — Encounter (HOSPITAL_COMMUNITY): Payer: Self-pay | Admitting: Internal Medicine

## 2015-12-29 DIAGNOSIS — I10 Essential (primary) hypertension: Secondary | ICD-10-CM | POA: Diagnosis not present

## 2015-12-29 DIAGNOSIS — N183 Chronic kidney disease, stage 3 (moderate): Secondary | ICD-10-CM

## 2015-12-29 DIAGNOSIS — I129 Hypertensive chronic kidney disease with stage 1 through stage 4 chronic kidney disease, or unspecified chronic kidney disease: Secondary | ICD-10-CM | POA: Diagnosis not present

## 2015-12-29 DIAGNOSIS — R634 Abnormal weight loss: Secondary | ICD-10-CM | POA: Diagnosis not present

## 2015-12-29 DIAGNOSIS — R197 Diarrhea, unspecified: Secondary | ICD-10-CM | POA: Diagnosis not present

## 2015-12-29 DIAGNOSIS — I160 Hypertensive urgency: Secondary | ICD-10-CM

## 2015-12-29 DIAGNOSIS — R079 Chest pain, unspecified: Secondary | ICD-10-CM

## 2015-12-29 DIAGNOSIS — N189 Chronic kidney disease, unspecified: Secondary | ICD-10-CM

## 2015-12-29 DIAGNOSIS — E1122 Type 2 diabetes mellitus with diabetic chronic kidney disease: Secondary | ICD-10-CM | POA: Diagnosis not present

## 2015-12-29 DIAGNOSIS — E876 Hypokalemia: Secondary | ICD-10-CM | POA: Diagnosis not present

## 2015-12-29 LAB — COMPREHENSIVE METABOLIC PANEL
ALBUMIN: 2.9 g/dL — AB (ref 3.5–5.0)
ALT: 11 U/L — AB (ref 17–63)
AST: 27 U/L (ref 15–41)
Alkaline Phosphatase: 40 U/L (ref 38–126)
Anion gap: 13 (ref 5–15)
BILIRUBIN TOTAL: 0.9 mg/dL (ref 0.3–1.2)
BUN: 21 mg/dL — AB (ref 6–20)
CHLORIDE: 103 mmol/L (ref 101–111)
CO2: 27 mmol/L (ref 22–32)
CREATININE: 1.64 mg/dL — AB (ref 0.61–1.24)
Calcium: 6 mg/dL — CL (ref 8.9–10.3)
GFR calc Af Amer: 41 mL/min — ABNORMAL LOW (ref >60)
GFR, EST NON AFRICAN AMERICAN: 36 mL/min — AB (ref >60)
GLUCOSE: 112 mg/dL — AB (ref 65–99)
POTASSIUM: 2.7 mmol/L — AB (ref 3.5–5.1)
Sodium: 143 mmol/L (ref 135–145)
TOTAL PROTEIN: 6 g/dL — AB (ref 6.5–8.1)

## 2015-12-29 LAB — BASIC METABOLIC PANEL
Anion gap: 10 (ref 5–15)
BUN: 20 mg/dL (ref 6–20)
CO2: 27 mmol/L (ref 22–32)
Calcium: 6.6 mg/dL — ABNORMAL LOW (ref 8.9–10.3)
Chloride: 102 mmol/L (ref 101–111)
Creatinine, Ser: 1.5 mg/dL — ABNORMAL HIGH (ref 0.61–1.24)
GFR calc Af Amer: 46 mL/min — ABNORMAL LOW (ref >60)
GFR calc non Af Amer: 40 mL/min — ABNORMAL LOW (ref >60)
Glucose, Bld: 105 mg/dL — ABNORMAL HIGH (ref 65–99)
Potassium: 4 mmol/L (ref 3.5–5.1)
Sodium: 139 mmol/L (ref 135–145)

## 2015-12-29 LAB — URINALYSIS, ROUTINE W REFLEX MICROSCOPIC
BILIRUBIN URINE: NEGATIVE
Bilirubin Urine: NEGATIVE
GLUCOSE, UA: 100 mg/dL — AB
Glucose, UA: 250 mg/dL — AB
Hgb urine dipstick: NEGATIVE
Hgb urine dipstick: NEGATIVE
KETONES UR: NEGATIVE mg/dL
Ketones, ur: NEGATIVE mg/dL
LEUKOCYTES UA: NEGATIVE
Leukocytes, UA: NEGATIVE
NITRITE: NEGATIVE
NITRITE: NEGATIVE
PH: 6.5 (ref 5.0–8.0)
Protein, ur: NEGATIVE mg/dL
Protein, ur: NEGATIVE mg/dL
SPECIFIC GRAVITY, URINE: 1.013 (ref 1.005–1.030)
Specific Gravity, Urine: 1.018 (ref 1.005–1.030)
pH: 6 (ref 5.0–8.0)

## 2015-12-29 LAB — PHOSPHORUS: Phosphorus: 3.2 mg/dL (ref 2.5–4.6)

## 2015-12-29 LAB — POTASSIUM
POTASSIUM: 3.1 mmol/L — AB (ref 3.5–5.1)
Potassium: 4.1 mmol/L (ref 3.5–5.1)

## 2015-12-29 LAB — MAGNESIUM: Magnesium: 0.4 mg/dL — CL (ref 1.7–2.4)

## 2015-12-29 LAB — CBC
HEMATOCRIT: 30 % — AB (ref 39.0–52.0)
Hemoglobin: 9.6 g/dL — ABNORMAL LOW (ref 13.0–17.0)
MCH: 30.3 pg (ref 26.0–34.0)
MCHC: 32 g/dL (ref 30.0–36.0)
MCV: 94.6 fL (ref 78.0–100.0)
PLATELETS: 179 10*3/uL (ref 150–400)
RBC: 3.17 MIL/uL — ABNORMAL LOW (ref 4.22–5.81)
RDW: 12.9 % (ref 11.5–15.5)
WBC: 5.6 10*3/uL (ref 4.0–10.5)

## 2015-12-29 LAB — ECHOCARDIOGRAM COMPLETE
Height: 65 in
Weight: 2006.4 oz

## 2015-12-29 LAB — TROPONIN I
TROPONIN I: 0.04 ng/mL — AB (ref <0.031)
TROPONIN I: 0.05 ng/mL — AB (ref <0.031)
TROPONIN I: 0.03 ng/mL (ref <0.031)

## 2015-12-29 LAB — SODIUM, URINE, RANDOM: Sodium, Ur: 159 mmol/L

## 2015-12-29 LAB — TSH: TSH: 2.939 u[IU]/mL (ref 0.350–4.500)

## 2015-12-29 LAB — CREATININE, URINE, RANDOM: Creatinine, Urine: 114.59 mg/dL

## 2015-12-29 MED ORDER — POTASSIUM CHLORIDE CRYS ER 20 MEQ PO TBCR
30.0000 meq | EXTENDED_RELEASE_TABLET | ORAL | Status: DC
  Administered 2015-12-29: 30 meq via ORAL
  Filled 2015-12-29: qty 1
  Filled 2015-12-29: qty 2

## 2015-12-29 MED ORDER — CARVEDILOL 12.5 MG PO TABS
12.5000 mg | ORAL_TABLET | Freq: Two times a day (BID) | ORAL | Status: DC
  Administered 2015-12-30 – 2015-12-31 (×3): 12.5 mg via ORAL
  Filled 2015-12-29 (×3): qty 1

## 2015-12-29 MED ORDER — TRAMADOL HCL 50 MG PO TABS: 50.0000 mg | ORAL_TABLET | Freq: Four times a day (QID) | ORAL | Status: DC | PRN

## 2015-12-29 MED ORDER — PANTOPRAZOLE SODIUM 40 MG PO TBEC
40.0000 mg | DELAYED_RELEASE_TABLET | Freq: Every day | ORAL | Status: DC
  Administered 2015-12-29 – 2015-12-31 (×3): 40 mg via ORAL
  Filled 2015-12-29 (×3): qty 1

## 2015-12-29 MED ORDER — AMLODIPINE BESYLATE 10 MG PO TABS
10.0000 mg | ORAL_TABLET | Freq: Every day | ORAL | Status: DC
  Administered 2015-12-29 – 2015-12-31 (×3): 10 mg via ORAL
  Filled 2015-12-29 (×3): qty 1

## 2015-12-29 MED ORDER — CARVEDILOL 12.5 MG PO TABS
12.5000 mg | ORAL_TABLET | Freq: Two times a day (BID) | ORAL | Status: DC
  Administered 2015-12-29: 12.5 mg via ORAL
  Filled 2015-12-29: qty 1

## 2015-12-29 MED ORDER — POTASSIUM CHLORIDE 10 MEQ/100ML IV SOLN
10.0000 meq | INTRAVENOUS | Status: DC
  Filled 2015-12-29: qty 100

## 2015-12-29 MED ORDER — HYDROCODONE-ACETAMINOPHEN 5-325 MG PO TABS: 1.0000 | ORAL_TABLET | ORAL | Status: DC | PRN

## 2015-12-29 MED ORDER — ONDANSETRON HCL 4 MG/2ML IJ SOLN: 4.0000 mg | Freq: Four times a day (QID) | INTRAMUSCULAR | Status: DC | PRN

## 2015-12-29 MED ORDER — ENOXAPARIN SODIUM 30 MG/0.3ML ~~LOC~~ SOLN
30.0000 mg | SUBCUTANEOUS | Status: DC
  Administered 2015-12-30: 30 mg via SUBCUTANEOUS
  Filled 2015-12-29: qty 0.3

## 2015-12-29 MED ORDER — ACETAMINOPHEN 325 MG PO TABS
650.0000 mg | ORAL_TABLET | Freq: Four times a day (QID) | ORAL | Status: DC | PRN
Start: 2015-12-29 — End: 2015-12-31

## 2015-12-29 MED ORDER — LABETALOL HCL 5 MG/ML IV SOLN: 10.0000 mg | INTRAVENOUS | Status: DC | PRN

## 2015-12-29 MED ORDER — ASPIRIN EC 81 MG PO TBEC
81.0000 mg | DELAYED_RELEASE_TABLET | Freq: Every day | ORAL | Status: DC
  Administered 2015-12-29 – 2015-12-31 (×3): 81 mg via ORAL
  Filled 2015-12-29 (×3): qty 1

## 2015-12-29 MED ORDER — POTASSIUM CHLORIDE 2 MEQ/ML IV SOLN
INTRAVENOUS | Status: DC
  Administered 2015-12-29: 21:00:00 via INTRAVENOUS
  Filled 2015-12-29: qty 1000

## 2015-12-29 MED ORDER — MAGNESIUM SULFATE 4 GM/100ML IV SOLN
4.0000 g | Freq: Once | INTRAVENOUS | Status: AC
  Administered 2015-12-29: 4 g via INTRAVENOUS
  Filled 2015-12-29: qty 100

## 2015-12-29 MED ORDER — POTASSIUM CHLORIDE CRYS ER 20 MEQ PO TBCR
40.0000 meq | EXTENDED_RELEASE_TABLET | Freq: Once | ORAL | Status: AC
  Administered 2015-12-29: 40 meq via ORAL
  Filled 2015-12-29: qty 2

## 2015-12-29 MED ORDER — SODIUM CHLORIDE 0.9 % IV SOLN
1.0000 g | Freq: Once | INTRAVENOUS | Status: AC
  Administered 2015-12-29: 1 g via INTRAVENOUS
  Filled 2015-12-29: qty 10

## 2015-12-29 MED ORDER — ACETAMINOPHEN 650 MG RE SUPP: 650.0000 mg | Freq: Four times a day (QID) | RECTAL | Status: DC | PRN

## 2015-12-29 MED ORDER — HYDROCHLOROTHIAZIDE 25 MG PO TABS: 25.0000 mg | ORAL_TABLET | Freq: Every day | ORAL | Status: DC

## 2015-12-29 MED ORDER — METOPROLOL TARTRATE 50 MG PO TABS
50.0000 mg | ORAL_TABLET | Freq: Two times a day (BID) | ORAL | Status: DC
  Administered 2015-12-29: 50 mg via ORAL
  Filled 2015-12-29: qty 2

## 2015-12-29 MED ORDER — SODIUM CHLORIDE 0.9% FLUSH
3.0000 mL | Freq: Two times a day (BID) | INTRAVENOUS | Status: DC
  Administered 2015-12-29 – 2015-12-30 (×3): 3 mL via INTRAVENOUS

## 2015-12-29 MED ORDER — ONDANSETRON HCL 4 MG PO TABS: 4.0000 mg | ORAL_TABLET | Freq: Four times a day (QID) | ORAL | Status: DC | PRN

## 2015-12-29 MED ORDER — CLONIDINE HCL 0.2 MG PO TABS
0.2000 mg | ORAL_TABLET | Freq: Two times a day (BID) | ORAL | Status: DC
  Administered 2015-12-29 – 2015-12-31 (×6): 0.2 mg via ORAL
  Filled 2015-12-29 (×6): qty 1

## 2015-12-29 MED ORDER — SODIUM CHLORIDE 0.9 % IV SOLN
INTRAVENOUS | Status: DC
  Administered 2015-12-29: 11:00:00 via INTRAVENOUS

## 2015-12-29 MED ORDER — POTASSIUM CHLORIDE 10 MEQ/100ML IV SOLN
10.0000 meq | INTRAVENOUS | Status: AC
  Administered 2015-12-29 (×2): 10 meq via INTRAVENOUS
  Filled 2015-12-29: qty 100

## 2015-12-29 MED ORDER — POTASSIUM CHLORIDE CRYS ER 20 MEQ PO TBCR
20.0000 meq | EXTENDED_RELEASE_TABLET | Freq: Once | ORAL | Status: AC
  Administered 2015-12-29: 20 meq via ORAL
  Filled 2015-12-29: qty 1

## 2015-12-29 NOTE — ED Notes (Signed)
Patient transported to Ultrasound 

## 2015-12-29 NOTE — ED Notes (Signed)
Hospitalist at bedside at this time 

## 2015-12-29 NOTE — Progress Notes (Signed)
Preliminary results by tech - Renal Duplex Completed. No evidence of renal stenosis noted in the main renal arteries bilaterally. It is noted - technically difficult study due to excessive overlying bowel gas. Marilynne Halstedita Keirra Zeimet, BS, RDMS, RVT

## 2015-12-29 NOTE — Progress Notes (Signed)
PROGRESS NOTE    [name]  UJW:[id number] DOB: [birth year] DOA: 12/28/2015 PCP: Oliver Barre, MD   Outpatient Specialists:   Brief Narrative: 82 y.o. male with medical history significant of hypertension, colon cancer and kidney disease. Patient was admitted with accelerated hypertension and chest pain. Patient ran out of his medication at home. Patient has marked electrolyte abnormality. On further questioning, patient endorsed history of chronic diarrhea and weight loss. History of colon cancer is noted. BP is slowly improving. Will monitor and manage the abnormal electrolytes. Will send stool for analysis. Low threshold to consult GI and/or Oncology team. Goal of care will dictate clinical course and consultations. Patient is a poor historian, and memory may not be optimal.   Assessment & Plan:   Active Problems:   Diabetes (HCC)   Essential hypertension   CKD (chronic kidney disease)   Hypokalemia   Hypertensive urgency   Chest pain  -Accelerated Hypertension - DC HCTZ and Metoprolol. Start coreg and Norvasc. Cautious control of BP. -Chest pain - Cycle cardiac enzymes. -Diarrhea - Stool for analysis -Weight loss - Calorie count. Low threshold to consult GI and/or Oncology. History of colon cancer noted -Hypokalemia - Replete -Hypocalcemia - Replete -Hypomagnesemia - Replete -Malnutrition - Moderate (Guarded prognosis) - CKD 3 - At baseline  DVT prophylaxis: Lovenox Code Status: Full Family Communication:   Disposition Plan: Home eventually.  Consultants:    Subjective: Nil complaints. No chest pain reported. No SOB. No fever or chills.  Objective: Filed Vitals:   12/29/15 0530 12/29/15 0600 12/29/15 0732 12/29/15 0825  BP: 161/91 170/89 171/95 152/91  Pulse: 69 68 81 73  Temp:    97.2 F (36.2 C)  TempSrc:    Oral  Resp: Height:     (1.651 m)  Weight:    56.881 kg (125 lb 6.4 oz)  SpO2: 100% 99% 99% 100%   No intake or output data in the 24  hours ending 12/29/15 1050 Filed Weights   12/29/15 0825  Weight: 56.881 kg (125 lb 6.4 oz)    Examination:  General exam: Appears calm and comfortable  Respiratory system: Clear to auscultation. Respiratory effort normal. Cardiovascular system: S1 & S2 heard, RRR. No JVD, murmurs, rubs, gallops or clicks. No pedal edema. Gastrointestinal system: Abdomen is nondistended, soft and nontender. No organomegaly or masses felt. Normal bowel sounds heard. Central nervous system: Alert and oriented. No focal neurological deficits. Extremities: Symmetric 5 x 5 power. Skin: No rashes, lesions or ulcers Psychiatry: Judgement and insight appear normal. Mood & affect appropriate.     Data Reviewed: I have personally reviewed following labs and imaging studies  CBC:  Recent Labs Lab 12/28/15 2124 12/29/15 0426  WBC 7.1 5.6  HGB 11.6* 9.6*  HCT 36.1* 30.0*  MCV 97.0 94.6  PLT 257 179   Basic Metabolic Panel:  Recent Labs Lab 12/28/15 2124 12/29/15 0426  NA 142 143  K 3.1* 2.7*  CL 102 103  CO2 25 27  GLUCOSE 109* 112*  BUN 20 21*  CREATININE 1.77* 1.64*  CALCIUM 6.4* 6.0*  MG  --  0.4*  PHOS  --  3.2   GFR: Estimated Creatinine Clearance: 25.1 mL/min (by C-G formula based on Cr of 1.64). Liver Function Tests:  Recent Labs Lab 12/29/15 0426  AST 27  ALT 11*  ALKPHOS 40  BILITOT 0.9  PROT 6.0*  ALBUMIN 2.9*   No results for input(s): LIPASE, AMYLASE in the last 168 hours.  No results for input(s): AMMONIA in the last 168 hours. Coagulation Profile: No results for input(s): INR, PROTIME in the last 168 hours. Cardiac Enzymes: No results for input(s): CKTOTAL, CKMB, CKMBINDEX, TROPONINI in the last 168 hours. BNP (last 3 results) No results for input(s): PROBNP in the last 8760 hours. HbA1C: No results for input(s): HGBA1C in the last 72 hours. CBG: No results for input(s): GLUCAP in the last 168 hours. Lipid Profile: No results for input(s): CHOL, HDL,  LDLCALC, TRIG, CHOLHDL, LDLDIRECT in the last 72 hours. Thyroid Function Tests:  Recent Labs  12/29/15 0426  TSH 2.939   Anemia Panel: No results for input(s): VITAMINB12, FOLATE, FERRITIN, TIBC, IRON, RETICCTPCT in the last 72 hours. Urine analysis:    Component Value Date/Time   COLORURINE YELLOW 12/29/2015 0717   APPEARANCEUR CLEAR 12/29/2015 0717   LABSPEC 1.013 12/29/2015 0717   PHURINE 6.5 12/29/2015 0717   GLUCOSEU 100* 12/29/2015 0717   GLUCOSEU 250* 09/30/2015 1711   HGBUR NEGATIVE 12/29/2015 0717   BILIRUBINUR NEGATIVE 12/29/2015 0717   KETONESUR NEGATIVE 12/29/2015 0717   PROTEINUR NEGATIVE 12/29/2015 0717   UROBILINOGEN 1.0 09/30/2015 1711   NITRITE NEGATIVE 12/29/2015 0717   LEUKOCYTESUR NEGATIVE 12/29/2015 0717   Sepsis Labs: @LABRCNTIP (procalcitonin:4,lacticidven:4)  )No results found for this or any previous visit (from the past 240 hour(s)).       Radiology Studies: Dg Chest 2 View  12/28/2015  CLINICAL DATA:  82 year old male with shortness of breath, chest pain, and dizziness EXAM: CHEST  2 VIEW COMPARISON:  Chest radiograph dated 08/26/2015 FINDINGS: Two views of the chest do not demonstrate a focal consolidation. There is no pleural effusion or pneumothorax. A 7 mm nodular appearing density in the right lung base on the frontal projection most likely represents a nipple shadow. The cardiac silhouette is within normal limits. There are degenerative changes of the spine and shoulders. No acute fracture. IMPRESSION: No active cardiopulmonary disease. Electronically Signed   By: Elgie Collard M.D.   On: 12/28/2015 21:50   Ct Head Wo Contrast  12/28/2015  CLINICAL DATA:  Central chest pain and dyspnea. Hypertensive. Headache. EXAM: CT HEAD WITHOUT CONTRAST TECHNIQUE: Contiguous axial images were obtained from the base of the skull through the vertex without intravenous contrast. COMPARISON:  12/07/2012 FINDINGS: There is no intracranial hemorrhage, mass or  evidence of acute infarction. There is moderate generalized atrophy. There is moderate chronic microvascular ischemic change. There is encephalomalacia due to prior infarction in the left cerebellar hemisphere. There is no significant extra-axial fluid collection. No acute intracranial findings are evident. The calvarium and skullbase are intact. The visible paranasal sinuses and orbits are unremarkable. IMPRESSION: No acute intracranial findings. There is moderate generalized atrophy and chronic appearing white matter hypodensities which likely represent small vessel ischemic disease. Remote left cerebellar hemisphere infarction. Electronically Signed   By: Ellery Plunk M.D.   On: 12/28/2015 23:02   US Renal  12/29/2015  CLINICAL DATA:  Chronic kidney disease.  Hypertensive emergency. EXAM: RENAL / URINARY TRACT ULTRASOUND COMPLETE COMPARISON:  None. FINDINGS: Right Kidney: Length: 9.6 cm. Echogenicity within normal limits. No mass or hydronephrosis visualized. Left Kidney: Length: 9.6 cm. Echogenicity within normal limits. No mass or hydronephrosis visualized. Bladder: Nearly empty but no abnormalities are evident. IMPRESSION: Negative for hydronephrosis. Both kidneys exhibit mild parenchymal thinning. Electronically Signed   By: Ellery Plunk M.D.   On: 12/29/2015 02:51        Scheduled Meds: . amLODipine  10 mg Oral Daily  .  aspirin EC  81 mg Oral Daily  . calcium gluconate  1 g Intravenous Once  . carvedilol  12.5 mg Oral BID WC  . cloNIDine  0.2 mg Oral BID  . enoxaparin (LOVENOX) injection  30 mg Subcutaneous Q24H  . pantoprazole  40 mg Oral Daily  . potassium chloride  10 mEq Intravenous Q1 Hr x 2  . sodium chloride flush  3 mL Intravenous Q12H   Continuous Infusions: . sodium chloride          Time spent: 40 Mins    Barnetta ChapelSylvester I Linkoln Alkire, MD [facility] Pager [phone] If 7PM-7AM, please contact night-coverage [email or web address] Password [email or web address] 12/29/2015,  10:50 AM

## 2015-12-29 NOTE — ED Notes (Signed)
[place]

## 2015-12-29 NOTE — ED Notes (Signed)
US requesting patient be NPO for 6 hours for his renal study. Pt. Just ate breakfast. Will pass information on to next nurse.

## 2015-12-29 NOTE — H&P (Signed)
[name] ZOX:[id number] DOB: [birth year] DOA: 12/28/2015    PCP: Oliver Barre, MD   Outpatient Specialists: [facility] [place] Patient coming from: home  Chief Complaint: chest pain  HPI: [name] is a 82 y.o. male with medical history significant of hypertension, colon cancer and kidney disease    Presented with worsening shortness of breath associated with pounding chest pain for the past 24 h but coming and going. This was associated with lightheadedness Today he was sitting down watching TV and felt like he was having tunnel vision. Not associated nausea or vomiting. Patient reports not taking his BP meds for the past few days because his pharmacy did not let him know they got the meds. Currently chest pain free   Regarding pertinent Chronic problems: Colon cancer 20 years ago sp resection and chemotherapy now in remmision.  Has DM on records but denies that he has it not on any medications.  His HTN has been poorly controlled.   IN ER: Noted blood pressure elevated 220/120 heart rate 86 WBC 7.1 hemoglobin 11.6 platelets 257 potassium 3.1 creatinine 1.7 Chest x-ray was unremarkable CT of the head non acute He was given nitroglycerin sublingual labetalol 10 mg IV and aspirin 324 with pressure went down to 185/96 currently asymptomatic chest pain has resolved    Hospitalist was called for admission for hypertensive urgency  Review of Systems:    Pertinent positives include: chest pain,  dizziness, palpitations  Constitutional:  No weight loss, night sweats, Fevers, chills, fatigue, weight loss  HEENT:  No headaches, Difficulty swallowing,Tooth/dental problems,Sore throat,  No sneezing, itching, ear ache, nasal congestion, post nasal drip,  Cardio-vascular:  No Orthopnea, PND, anasarca,.no Bilateral lower extremity swelling  GI:  No heartburn, indigestion, abdominal pain, nausea, vomiting, diarrhea, change in bowel habits, loss of appetite, melena, blood in stool,  hematemesis Resp:  no shortness of breath at rest. No dyspnea on exertion, No excess mucus, no productive cough, No non-productive cough, No coughing up of blood.No change in color of mucus.No wheezing. Skin:  no rash or lesions. No jaundice GU:  no dysuria, change in color of urine, no urgency or frequency. No straining to urinate.  No flank pain.  Musculoskeletal:  No joint pain or no joint swelling. No decreased range of motion. No back pain.  Psych:  No change in mood or affect. No depression or anxiety. No memory loss.  Neuro: no localizing neurological complaints, no tingling, no weakness, no double vision, no gait abnormality, no slurred speech, no confusion  As per HPI otherwise 10 point review of systems negative.   Past Medical History: Past Medical History  Diagnosis Date  . DIABETES MELLITUS, TYPE II 12/24/2007  . HYPERLIPIDEMIA 03/24/2007  . ANEMIA-NOS 06/02/2007  . HEARING LOSS, LEFT EAR 01/18/2010  . HYPERTENSION 03/24/2007  . PULMONARY NODULE 06/02/2007  . STRICTURE, ESOPHAGEAL 06/02/2007  . GERD 06/02/2007  . GASTROENTERITIS, ACUTE 05/30/2010  . CONSTIPATION 01/18/2010  . CHOLELITHIASIS 06/02/2007  . RENAL INSUFFICIENCY 06/02/2007  . OSTEOARTHRITIS, KNEES, BILATERAL 01/26/2008  . FEVER UNSPECIFIED 01/18/2010  . Abdominal pain, right upper quadrant 06/12/2007  . EPIGASTRIC TENDERNESS 01/18/2010  . COLON CANCER, HX OF 03/24/2007  . NEPHROLITHIASIS, HX OF 06/02/2007   Past Surgical History  Procedure Laterality Date  . Small intestine surgery       Social History:  Ambulatory  independently   Lives at home With family     reports that he has quit smoking. He does not have any smokeless tobacco  history on file. He reports that he does not drink alcohol or use illicit drugs.  Allergies:  No Known Allergies     Family History:  Family History  Problem Relation Age of Onset  . Hypertension Other     Medications: Prior to Admission medications   Medication Sig  Start Date End Date Taking? Authorizing Provider  cloNIDine (CATAPRES) 0.2 MG tablet Take 1 tablet (0.2 mg total) by mouth 2 (two) times daily. 12/13/15  Yes Corwin LevinsJames W John, MD  hydrochlorothiazide (HYDRODIURIL) 25 MG tablet Take 1 tablet (25 mg total) by mouth daily. 12/13/15  Yes Corwin LevinsJames W John, MD  metoprolol (LOPRESSOR) 50 MG tablet Take 1 tablet (50 mg total) by mouth 2 (two) times daily. 12/13/15  Yes Corwin LevinsJames W John, MD  omeprazole (PRILOSEC) 20 MG capsule Take 1 capsule (20 mg total) by mouth daily. 12/13/15  Yes Corwin LevinsJames W John, MD  traMADol (ULTRAM) 50 MG tablet Take 1 tablet (50 mg total) by mouth every 8 (eight) hours as needed. 12/13/15  Yes Corwin LevinsJames W John, MD  amLODipine (NORVASC) 10 MG tablet Take 1 tablet (10 mg total) by mouth daily. 12/13/15   Corwin LevinsJames W John, MD    Physical Exam: Patient Vitals for the past 24 hrs:  BP Temp Temp src Pulse Resp SpO2 Height  12/28/15 2345 (!) 187/103 mmHg - - 86 - 100 % -  12/28/15 2330 (!) 203/116 mmHg - - 83 - 100 % -  12/28/15 2315 (!) 199/104 mmHg - - 85 - 100 % -  12/28/15 2215 (!) 220/128 mmHg - - 75 24 96 % -  12/28/15 2200 (!) 218/109 mmHg - - 84 23 99 % -  12/28/15 2130 (!) 223/113 mmHg - - 83 19 97 % -  12/28/15 2115 (!) 217/128 mmHg - - 82 17 98 % -  12/28/15 2101 (!) 229/119 mmHg - - - - - -  12/28/15 2058 (!) 222/110 mmHg 98.3 F (36.8 C) Oral 91 18 100 % 5\' 5"  (1.651 m)    1. General:  in No Acute distress 2. Psychological: Alert and  Oriented 3. Head/ENT:   Moist  Mucous Membranes                          Head Non traumatic, neck supple                          NormalDentition 4. SKIN: normal  Skin turgor,  Skin clean Dry and intact no rash 5. Heart: Regular rate and rhythm systolic  Murmur no Rub or gallop 6. Lungs:  Clear to auscultation bilaterally, no wheezes or crackles   7. Abdomen: Soft, non-tender, Non distended 8. Lower extremities: no clubbing, cyanosis, or edema 9. Neurologically Grossly intact, moving all 4 extremities  equally 10. MSK: Normal range of motion   body mass index is unknown because there is no weight on file.  Labs on Admission:   Labs on Admission: I have personally reviewed following labs and imaging studies  CBC:  Recent Labs Lab 12/28/15 2124  WBC 7.1  HGB 11.6*  HCT 36.1*  MCV 97.0  PLT 257   Basic Metabolic Panel:  Recent Labs Lab 12/28/15 2124  NA 142  K 3.1*  CL 102  CO2 25  GLUCOSE 109*  BUN 20  CREATININE 1.77*  CALCIUM 6.4*   GFR: CrCl cannot be calculated (Unknown ideal weight.). Liver Function Tests:  No results for input(s): AST, ALT, ALKPHOS, BILITOT, PROT, ALBUMIN in the last 168 hours. No results for input(s): LIPASE, AMYLASE in the last 168 hours. No results for input(s): AMMONIA in the last 168 hours. Coagulation Profile: No results for input(s): INR, PROTIME in the last 168 hours. Cardiac Enzymes: No results for input(s): CKTOTAL, CKMB, CKMBINDEX, TROPONINI in the last 168 hours. BNP (last 3 results) No results for input(s): PROBNP in the last 8760 hours. HbA1C: No results for input(s): HGBA1C in the last 72 hours. CBG: No results for input(s): GLUCAP in the last 168 hours. Lipid Profile: No results for input(s): CHOL, HDL, LDLCALC, TRIG, CHOLHDL, LDLDIRECT in the last 72 hours. Thyroid Function Tests: No results for input(s): TSH, T4TOTAL, FREET4, T3FREE, THYROIDAB in the last 72 hours. Anemia Panel: No results for input(s): VITAMINB12, FOLATE, FERRITIN, TIBC, IRON, RETICCTPCT in the last 72 hours. Urine analysis:    Component Value Date/Time   COLORURINE YELLOW 09/30/2015 1711   APPEARANCEUR CLEAR 09/30/2015 1711   LABSPEC 1.025 09/30/2015 1711   PHURINE 5.5 09/30/2015 1711   GLUCOSEU 250* 09/30/2015 1711   GLUCOSEU 100* 08/26/2015 2205   HGBUR NEGATIVE 09/30/2015 1711   BILIRUBINUR NEGATIVE 09/30/2015 1711   KETONESUR TRACE* 09/30/2015 1711   PROTEINUR NEGATIVE 08/26/2015 2205   UROBILINOGEN 1.0 09/30/2015 1711   NITRITE  NEGATIVE 09/30/2015 1711   LEUKOCYTESUR NEGATIVE 09/30/2015 1711   Sepsis Labs: @LABRCNTIP (procalcitonin:4,lacticidven:4) )No results found for this or any previous visit (from the past 240 hour(s)).     UA ordered  Lab Results  Component Value Date   HGBA1C 6.0 01/11/2015    CrCl cannot be calculated (Unknown ideal weight.).  BNP (last 3 results) No results for input(s): PROBNP in the last 8760 hours.   ECG REPORT  Independently reviewed Rate:95  Rhythm: NSR ST&T Change: Modest depressions in lateral leads QTC 444  There were no vitals filed for this visit.   Cultures:    Component Value Date/Time   SDES BLOOD RIGHT HAND 08/28/2015 1330   SPECREQUEST BOTTLES DRAWN AEROBIC AND ANAEROBIC 10CC 08/28/2015 1330   CULT NO GROWTH 5 DAYS 08/28/2015 1330   REPTSTATUS 09/02/2015 FINAL 08/28/2015 1330     Radiological Exams on Admission: Dg Chest 2 View  12/28/2015  CLINICAL DATA:  82 year old male with shortness of breath, chest pain, and dizziness EXAM: CHEST  2 VIEW COMPARISON:  Chest radiograph dated 08/26/2015 FINDINGS: Two views of the chest do not demonstrate a focal consolidation. There is no pleural effusion or pneumothorax. A 7 mm nodular appearing density in the right lung base on the frontal projection most likely represents a nipple shadow. The cardiac silhouette is within normal limits. There are degenerative changes of the spine and shoulders. No acute fracture. IMPRESSION: No active cardiopulmonary disease. Electronically Signed   By: Elgie Collard M.D.   On: 12/28/2015 21:50   Ct Head Wo Contrast  12/28/2015  CLINICAL DATA:  Central chest pain and dyspnea. Hypertensive. Headache. EXAM: CT HEAD WITHOUT CONTRAST TECHNIQUE: Contiguous axial images were obtained from the base of the skull through the vertex without intravenous contrast. COMPARISON:  12/07/2012 FINDINGS: There is no intracranial hemorrhage, mass or evidence of acute infarction. There is moderate  generalized atrophy. There is moderate chronic microvascular ischemic change. There is encephalomalacia due to prior infarction in the left cerebellar hemisphere. There is no significant extra-axial fluid collection. No acute intracranial findings are evident. The calvarium and skullbase are intact. The visible paranasal sinuses and orbits are unremarkable. IMPRESSION:  No acute intracranial findings. There is moderate generalized atrophy and chronic appearing white matter hypodensities which likely represent small vessel ischemic disease. Remote left cerebellar hemisphere infarction. Electronically Signed   By: Ellery Plunk M.D.   On: 12/28/2015 23:02    Chart has been reviewed    Assessment/Plan   82 y.o. male with medical history significant of hypertension, colon cancer and kidney disease  here with hypertensive urgency secondary to poor to control hypertension as well as medical noncompliance associated chest pain   Present on Admission:   . Hypertensive urgency restart home medications, given poorly controlled HTN at baseline will get renal artery [place] . Hypokalemia - will replace, magnesium level in AM . Chest pain - given risk factors will admit, monitor on telemetry, cycle cardiac enzymes, obtain serial ECG. Further risk stratify with lipid panel, hgA1C, obtain TSH. Make sure patient is on Aspirin. Further treatment based on the currently pending results.  . CKD (chronic kidney disease) - obtain renal ultrasound and urine electrolytes evaluate for renal artery stenosis Other plan as per orders.  DVT prophylaxis:   Lovenox     Code Status:  FULL CODE  as per patient    Family Communication:   Family  at  Bedside  plan of care was discussed with Niece Philipp Deputy  Disposition Plan:     To home once workup is complete and patient is stable   Consults called: none  Admission status:  obs    Level of care    tele       I have spent a total of on this admission     Shaquisha Wynn 12/29/2015, 1:03 AM    [facility]  Pager [phone]   after 2 AM please page floor coverage PA If 7AM-7PM, please contact the day team taking care of the patient  [email or web address]  Password [email or web address]

## 2015-12-29 NOTE — Progress Notes (Signed)
  Echocardiogram 2D Echocardiogram has been performed.  Delcie RochENNINGTON, Haileigh Pitz 12/29/2015, 1:10 PM

## 2015-12-30 DIAGNOSIS — N183 Chronic kidney disease, stage 3 (moderate): Secondary | ICD-10-CM | POA: Diagnosis not present

## 2015-12-30 DIAGNOSIS — E876 Hypokalemia: Secondary | ICD-10-CM

## 2015-12-30 DIAGNOSIS — I10 Essential (primary) hypertension: Secondary | ICD-10-CM | POA: Diagnosis not present

## 2015-12-30 DIAGNOSIS — R634 Abnormal weight loss: Secondary | ICD-10-CM | POA: Diagnosis not present

## 2015-12-30 DIAGNOSIS — E43 Unspecified severe protein-calorie malnutrition: Secondary | ICD-10-CM

## 2015-12-30 LAB — COMPREHENSIVE METABOLIC PANEL
ALT: 8 U/L — ABNORMAL LOW (ref 17–63)
AST: 23 U/L (ref 15–41)
Albumin: 2.5 g/dL — ABNORMAL LOW (ref 3.5–5.0)
Alkaline Phosphatase: 37 U/L — ABNORMAL LOW (ref 38–126)
Anion gap: 8 (ref 5–15)
BUN: 20 mg/dL (ref 6–20)
CO2: 24 mmol/L (ref 22–32)
Calcium: 6.2 mg/dL — CL (ref 8.9–10.3)
Chloride: 106 mmol/L (ref 101–111)
Creatinine, Ser: 1.53 mg/dL — ABNORMAL HIGH (ref 0.61–1.24)
GFR calc Af Amer: 45 mL/min — ABNORMAL LOW (ref >60)
GFR calc non Af Amer: 39 mL/min — ABNORMAL LOW (ref >60)
Glucose, Bld: 113 mg/dL — ABNORMAL HIGH (ref 65–99)
Potassium: 4.1 mmol/L (ref 3.5–5.1)
Sodium: 138 mmol/L (ref 135–145)
Total Bilirubin: 1 mg/dL (ref 0.3–1.2)
Total Protein: 4.9 g/dL — ABNORMAL LOW (ref 6.5–8.1)

## 2015-12-30 LAB — MAGNESIUM: Magnesium: 1.4 mg/dL — ABNORMAL LOW (ref 1.7–2.4)

## 2015-12-30 LAB — POTASSIUM: Potassium: 4 mmol/L (ref 3.5–5.1)

## 2015-12-30 LAB — HEMOGLOBIN A1C
Hgb A1c MFr Bld: 6.6 % — ABNORMAL HIGH (ref 4.8–5.6)
MEAN PLASMA GLUCOSE: 143 mg/dL

## 2015-12-30 LAB — PHOSPHORUS: Phosphorus: 2.9 mg/dL (ref 2.5–4.6)

## 2015-12-30 MED ORDER — ADULT MULTIVITAMIN W/MINERALS CH
1.0000 | ORAL_TABLET | Freq: Every day | ORAL | Status: DC
  Administered 2015-12-30 – 2015-12-31 (×2): 1 via ORAL
  Filled 2015-12-30 (×2): qty 1

## 2015-12-30 MED ORDER — MAGNESIUM SULFATE 2 GM/50ML IV SOLN
2.0000 g | Freq: Once | INTRAVENOUS | Status: AC
  Administered 2015-12-30: 2 g via INTRAVENOUS
  Filled 2015-12-30: qty 50

## 2015-12-30 MED ORDER — MEGESTROL ACETATE 400 MG/10ML PO SUSP
400.0000 mg | Freq: Two times a day (BID) | ORAL | Status: DC
  Administered 2015-12-30 – 2015-12-31 (×2): 400 mg via ORAL
  Filled 2015-12-30 (×2): qty 10

## 2015-12-30 MED ORDER — SODIUM CHLORIDE 0.9 % IV SOLN
INTRAVENOUS | Status: DC
  Filled 2015-12-30 (×2): qty 1000

## 2015-12-30 NOTE — Consult Note (Addendum)
   [facility] CM Inpatient Consult   12/30/2015  [name] 08/13/1925 [id number]   Patient screened for potential [place] Management services. Patient is eligible for [facility] Care Management services under patient's [employer]/Medicare ACO Registry plan. Met with the patient at bedside regarding post hospital needs.  Patient states he is not sure of his current plan because his wife is in a rehab facility because she fell and broke her leg in two places. Will follow up with inpatient RNCM regarding patient's concerns. Spoke with inpatient RNCM and patient will be staying with his daughter.   Please place a [facility] Care Management consult or for questions contact:   Natividad Brood, [facility] Liaison  [phone] business mobile phone Toll free office [phone]   Addendem:  A brochure and contact information was given to share with his daughter.

## 2015-12-30 NOTE — Progress Notes (Signed)
PROGRESS NOTE    [name]  ZOX:[id number] DOB: 14-Sep-1924 DOA: 12/28/2015 PCP: Oliver Barre, MD   Outpatient Specialists:   Brief Narrative: 82 y.o. male with medical history significant of hypertension, colon cancer and kidney disease. Patient was admitted with accelerated hypertension and chest pain. Patient ran out of his medication at home. Patient has marked electrolyte abnormality. On further questioning, patient endorsed history of chronic diarrhea and weight loss. History of colon cancer is noted. BP is slowly improving. Will monitor and manage the abnormal electrolytes. Stool analysis still pending as patient has not had bowel movement since yesterday. Will consult PT/OT/Case management. Calorie count and dietician consult. For trial of megace. Assessment & Plan:   Active Problems:   Diabetes (HCC)   Essential hypertension   CKD (chronic kidney disease)   Hypokalemia   Hypertensive urgency   Chest pain   Accelerated hypertension   Diarrhea   Loss of weight   Chronic kidney disease   Protein-calorie malnutrition, severe  -Accelerated Hypertension - Continues to improve. Will keep SBP around due to patient's age. -Chest pain - Negative cardiac enzymes. -Diarrhea - Resolved. No sample for testing till date. -Weight loss - Calorie count. Start Megace -Hypokalemia - Repleted -Hypocalcemia - Check ionized calcium -Hypomagnesemia - Replete -Malnutrition - Moderate (Guarded prognosis) - CKD 3 - At baseline  DVT prophylaxis: Lovenox Code Status: Full Family Communication:   Disposition Plan: Home eventually.  Consultants:    Subjective: Nil complaints. No chest pain reported. No SOB. No fever or chills.  Objective: Filed Vitals:   12/29/15 1934 12/30/15 0643 12/30/15 1013 12/30/15 1152  BP: 124/69 130/63 159/85 155/80  Pulse: 70 65 77 71  Temp: 97.5 F (36.4 C) 97.9 F (36.6 C) 97.5 F (36.4 C) 97.9 F (36.6 C)  TempSrc: Oral Oral Oral Oral  Resp: 16 18  18 18   Height:      Weight:  57.788 kg (127 lb 6.4 oz)    SpO2: 99% 99% 100% 100%    Intake/Output Summary (Last 24 hours) at 12/30/15 1619 Last data filed at 12/30/15 1100  Gross per 24 hour  Intake 1195.5 ml  Output   1025 ml  Net  170.5 ml   Filed Weights   12/29/15 0825 12/30/15 0643  Weight: 56.881 kg (125 lb 6.4 oz) 57.788 kg (127 lb 6.4 oz)    Examination:  General exam: Appears calm and comfortable  Respiratory system: Clear to auscultation. Respiratory effort normal. Cardiovascular system: S1 & S2 heard, RRR. No JVD, murmurs, rubs, gallops or clicks. No pedal edema. Gastrointestinal system: Abdomen is nondistended, soft and nontender. No organomegaly or masses felt. Normal bowel sounds heard. Central nervous system: Alert and oriented. No focal neurological deficits. Extremities: Symmetric 5 x 5 power. Skin: No rashes, lesions or ulcers Psychiatry: Judgement and insight appear normal. Mood & affect appropriate.     Data Reviewed: I have personally reviewed following labs and imaging studies  CBC:  Recent Labs Lab 12/28/15 2124 12/29/15 0426  WBC 7.1 5.6  HGB 11.6* 9.6*  HCT 36.1* 30.0*  MCV 97.0 94.6  PLT 257 179   Basic Metabolic Panel:  Recent Labs Lab 12/28/15 2124 12/29/15 0426 12/29/15 0908 12/29/15 1634 12/29/15 2017 12/30/15 0145 12/30/15 1100  NA 142 143  --   --  139  --  138  K 3.1* 2.7* 3.1* 4.1 4.0 4.0 4.1  CL 102 103  --   --  102  --  106  CO2 25  27  --   --  27  --  24  GLUCOSE 109* 112*  --   --  105*  --  113*  BUN 20 21*  --   --  20  --  20  CREATININE 1.77* 1.64*  --   --  1.50*  --  1.53*  CALCIUM 6.4* 6.0*  --   --  6.6*  --  6.2*  MG  --  0.4*  --   --   --  1.4*  --   PHOS  --  3.2  --   --   --   --  2.9   GFR: Estimated Creatinine Clearance: 27.3 mL/min (by C-G formula based on Cr of 1.53). Liver Function Tests:  Recent Labs Lab 12/29/15 0426 12/30/15 1100  AST 27 23  ALT 11* 8*  ALKPHOS 40 37*  BILITOT  0.9 1.0  PROT 6.0* 4.9*  ALBUMIN 2.9* 2.5*   No results for input(s): LIPASE, AMYLASE in the last 168 hours. No results for input(s): AMMONIA in the last 168 hours. Coagulation Profile: No results for input(s): INR, PROTIME in the last 168 hours. Cardiac Enzymes:  Recent Labs Lab 12/29/15 0908 12/29/15 1444 12/29/15 2025  TROPONINI 0.04* 0.05* 0.03   BNP (last 3 results) No results for input(s): PROBNP in the last 8760 hours. HbA1C:  Recent Labs  12/29/15 0426  HGBA1C 6.6*   CBG: No results for input(s): GLUCAP in the last 168 hours. Lipid Profile: No results for input(s): CHOL, HDL, LDLCALC, TRIG, CHOLHDL, LDLDIRECT in the last 72 hours. Thyroid Function Tests:  Recent Labs  12/29/15 0426  TSH 2.939   Anemia Panel: No results for input(s): VITAMINB12, FOLATE, FERRITIN, TIBC, IRON, RETICCTPCT in the last 72 hours. Urine analysis:    Component Value Date/Time   COLORURINE YELLOW 12/29/2015 0717   APPEARANCEUR CLEAR 12/29/2015 0717   LABSPEC 1.013 12/29/2015 0717   PHURINE 6.5 12/29/2015 0717   GLUCOSEU 100* 12/29/2015 0717   GLUCOSEU 250* 09/30/2015 1711   HGBUR NEGATIVE 12/29/2015 0717   BILIRUBINUR NEGATIVE 12/29/2015 0717   KETONESUR NEGATIVE 12/29/2015 0717   PROTEINUR NEGATIVE 12/29/2015 0717   UROBILINOGEN 1.0 09/30/2015 1711   NITRITE NEGATIVE 12/29/2015 0717   LEUKOCYTESUR NEGATIVE 12/29/2015 0717   Sepsis Labs: (procalcitonin:4,lacticidven:4)  )No results found for this or any previous visit (from the past 240 hour(s)).       Radiology Studies: Dg Chest 2 View  12/28/2015  CLINICAL DATA:  82 year old male with shortness of breath, chest pain, and dizziness EXAM: CHEST  2 VIEW COMPARISON:  Chest radiograph dated 08/26/2015 FINDINGS: Two views of the chest do not demonstrate a focal consolidation. There is no pleural effusion or pneumothorax. A 7 mm nodular appearing density in the right lung base on the frontal projection most likely  represents a nipple shadow. The cardiac silhouette is within normal limits. There are degenerative changes of the spine and shoulders. No acute fracture. IMPRESSION: No active cardiopulmonary disease. Electronically Signed   By: Elgie Collard M.D.   On: 12/28/2015 21:50   Ct Head Wo Contrast  12/28/2015  CLINICAL DATA:  Central chest pain and dyspnea. Hypertensive. Headache. EXAM: CT HEAD WITHOUT CONTRAST TECHNIQUE: Contiguous axial images were obtained from the base of the skull through the vertex without intravenous contrast. COMPARISON:  12/07/2012 FINDINGS: There is no intracranial hemorrhage, mass or evidence of acute infarction. There is moderate generalized atrophy. There is moderate chronic microvascular ischemic change. There is encephalomalacia due to  prior infarction in the left cerebellar hemisphere. There is no significant extra-axial fluid collection. No acute intracranial findings are evident. The calvarium and skullbase are intact. The visible paranasal sinuses and orbits are unremarkable. IMPRESSION: No acute intracranial findings. There is moderate generalized atrophy and chronic appearing white matter hypodensities which likely represent small vessel ischemic disease. Remote left cerebellar hemisphere infarction. Electronically Signed   By: Ellery Plunkaniel R Mitchell M.D.   On: 12/28/2015 23:02   [place] Renal  12/29/2015  CLINICAL DATA:  Chronic kidney disease.  Hypertensive emergency. EXAM: RENAL / URINARY TRACT ULTRASOUND COMPLETE COMPARISON:  None. FINDINGS: Right Kidney: Length: 9.6 cm. Echogenicity within normal limits. No mass or hydronephrosis visualized. Left Kidney: Length: 9.6 cm. Echogenicity within normal limits. No mass or hydronephrosis visualized. Bladder: Nearly empty but no abnormalities are evident. IMPRESSION: Negative for hydronephrosis. Both kidneys exhibit mild parenchymal thinning. Electronically Signed   By: Ellery Plunkaniel R Mitchell M.D.   On: 12/29/2015 02:51        Scheduled  Meds: . amLODipine  10 mg Oral Daily  . aspirin EC  81 mg Oral Daily  . carvedilol  12.5 mg Oral BID WC  . cloNIDine  0.2 mg Oral BID  . enoxaparin (LOVENOX) injection  30 mg Subcutaneous Q24H  . multivitamin with minerals  1 tablet Oral Daily  . pantoprazole  40 mg Oral Daily  . sodium chloride flush  3 mL Intravenous Q12H   Continuous Infusions: . sodium chloride 0.9 % with KCl Pediatric custom IV fluid 50 mL/hr at 12/29/15 2100        Time spent: 40 Mins    Barnetta ChapelSylvester I Ogbata, MD [facility] Pager [phone] If 7PM-7AM, please contact night-coverage [email or web address] Password [email or web address] 12/30/2015, 4:19 PM

## 2015-12-30 NOTE — Care Management Note (Signed)
Case Management Note  Patient Details  Name: [name] MRN: [id number] Date of Birth: 10/23/1924  Subjective/Objective:       Admitted with CKD             Action/Plan: Patient will be staying with his daughter [name] [phone]) at discharge. His wife fell and fractured her knee and [facility]. She has been there for a week so far. Patient gave CM permission to talk to [name]; [employer]; she stated that he is independent at home, cooks and performs his ADL's. She takes him to his apts. HHC offered, patient does not want it at this time, also [name] stated that he will be fine for now at home. PCP is Dr Oliver BarreJames John with [facility]; Private insurance with [employer] with prescription drug coverage; pharmacy of choice is [facility]-daughter reports no problems with medication at this time. CM will continue to follow for DC:   Expected Discharge Date:    possibly 12/31/2015              Expected Discharge Plan:  Home/Self Care  Discharge planning Services  CM Consult   Choice offered to:  Patient, Adult Children  Status of Service:  In process, will continue to follow  Chase MosherChandler, Carolene Gitto L, RN,MHA,BSN [phone] 12/30/2015, 2:14 PM

## 2015-12-30 NOTE — Progress Notes (Signed)
Nurse discontinued enteric precaution orders per MD.  Patient has not had BM since admission.

## 2015-12-30 NOTE — Progress Notes (Addendum)
Initial Nutrition Assessment  DOCUMENTATION CODES:   Severe malnutrition in context of chronic illness  INTERVENTION:  Calorie Count x 48 hours (no meal tickets to review today, re-start 48 hours today); RD to follow-up Monday to review calorie count Add Dysphagia 2 (soft chopped foods) to diet order Recommend liberalizing diet to Low Sodium Provide Magic Cup BID with meals, each supplement provides 290 kcal and 9 grams of protein Provide HS snack Multivitamin with minerals daily   NUTRITION DIAGNOSIS:   Malnutrition related to chronic illness, poor appetite as evidenced by severe depletion of body fat, severe depletion of muscle mass, energy intake < or equal to 75% for > or equal to 1 month.   GOAL:   Patient will meet greater than or equal to 90% of their needs   MONITOR:   PO intake, Labs, Weight trends, Supplement acceptance, Skin, I & O's  REASON FOR ASSESSMENT:   Consult Calorie Count  ASSESSMENT:   82 y.o. male with medical history significant of hypertension, colon cancer and kidney disease   Pt states that he has had a poor appetite and has been losing weight over the past few years. He states that he used to weigh 160-170 lbs a long time ago. He reports eating 2 meals daily, amount similar to one sandwich at each meal. He denies any nausea, abdominal, pain, reflux, or indigestion contributing to poor appetite. He states that he does not like and cannot drink Ensure/Boost supplements. He states that he is able to cook at home and has no issues obtaining food. Pt has severe muscle and fat wasting per nutrition focused physical exam. Weight history shows 13% weight loss in the past year- not significant for time frame.  RD emphasized the importance of adequate nutrition intake and weight maintenance/gain. Pt agreeable to receiving Magic Cup ice cream with meals and an evening snack.  Per nursing notes, pt at 100% of his breakfast this morning.  Labs:  Low calcium,  low magnesium, low protein  Diet Order:  Diet Heart Room service appropriate?: Yes; Fluid consistency:: Thin  Skin:  Reviewed, no issues  Last BM:  5/10  Height:   Ht Readings from Last 1 Encounters:  12/29/15 5\' 5"  (1.651 m)    Weight:   Wt Readings from Last 1 Encounters:  12/30/15 127 lb 6.4 oz (57.788 kg)    Ideal Body Weight:  61.8 kg  BMI:  Body mass index is 21.2 kg/(m^2).  Estimated Nutritional Needs:   Kcal:  1700-1900  Protein:  70-80 grams  Fluid:  >/= 1.7 L/day  EDUCATION NEEDS:   No education needs identified at this time  [name] RD, LDN Inpatient Clinical Dietitian Pager: [phone] After Hours Pager: [phone]

## 2015-12-30 NOTE — Care Management Obs Status (Signed)
MEDICARE OBSERVATION STATUS NOTIFICATION   Patient Details  Name: [name] MRN: [id number] Date of Birth: 05/14/1925   Medicare Observation Status Notification Given:  Yes    Cherrie DistanceChandler, Dorlis Judice L, RN 12/30/2015, 2:14 PM

## 2015-12-30 NOTE — Progress Notes (Signed)
Repeat Mag level = 1.4. MD paged. Will continue to monitor.

## 2015-12-30 NOTE — Progress Notes (Signed)
CRITICAL VALUE ALERT  Critical value received:  Calcium  Date of notification:  12/30/15  Time of notification:  11:55a  Critical value read back: yes  Nurse who received alert:  Eligah Eastenequal Nathanyal Ashmead RN  MD notified (1st page):  Dartha Lodgegbata, MD  Time of first page:  MD notified in person while on unit  MD notified (2nd page): n/a  Time of second page: n/a  Responding MD:  n/a  Time MD responded:  N/a  Nurse given orders from MD; will follow as directed.

## 2015-12-31 DIAGNOSIS — E876 Hypokalemia: Secondary | ICD-10-CM | POA: Diagnosis not present

## 2015-12-31 DIAGNOSIS — I10 Essential (primary) hypertension: Secondary | ICD-10-CM | POA: Diagnosis not present

## 2015-12-31 DIAGNOSIS — R079 Chest pain, unspecified: Secondary | ICD-10-CM | POA: Diagnosis not present

## 2015-12-31 DIAGNOSIS — R634 Abnormal weight loss: Secondary | ICD-10-CM | POA: Diagnosis not present

## 2015-12-31 LAB — COMPREHENSIVE METABOLIC PANEL
ALT: 12 U/L — ABNORMAL LOW (ref 17–63)
AST: 22 U/L (ref 15–41)
Albumin: 2.4 g/dL — ABNORMAL LOW (ref 3.5–5.0)
Alkaline Phosphatase: 40 U/L (ref 38–126)
Anion gap: 9 (ref 5–15)
BUN: 21 mg/dL — ABNORMAL HIGH (ref 6–20)
CO2: 25 mmol/L (ref 22–32)
Calcium: 7.1 mg/dL — ABNORMAL LOW (ref 8.9–10.3)
Chloride: 105 mmol/L (ref 101–111)
Creatinine, Ser: 1.47 mg/dL — ABNORMAL HIGH (ref 0.61–1.24)
GFR calc Af Amer: 47 mL/min — ABNORMAL LOW (ref >60)
GFR calc non Af Amer: 41 mL/min — ABNORMAL LOW (ref >60)
Glucose, Bld: 114 mg/dL — ABNORMAL HIGH (ref 65–99)
Potassium: 4.4 mmol/L (ref 3.5–5.1)
Sodium: 139 mmol/L (ref 135–145)
Total Bilirubin: 0.6 mg/dL (ref 0.3–1.2)
Total Protein: 5.4 g/dL — ABNORMAL LOW (ref 6.5–8.1)

## 2015-12-31 LAB — PHOSPHORUS: Phosphorus: 2.6 mg/dL (ref 2.5–4.6)

## 2015-12-31 LAB — MAGNESIUM: Magnesium: 1.7 mg/dL (ref 1.7–2.4)

## 2015-12-31 MED ORDER — ASPIRIN 81 MG PO TBEC: 81.0000 mg | DELAYED_RELEASE_TABLET | Freq: Every day | ORAL | Status: AC

## 2015-12-31 MED ORDER — CARVEDILOL 12.5 MG PO TABS: 12.5000 mg | ORAL_TABLET | Freq: Two times a day (BID) | ORAL | Status: AC

## 2015-12-31 MED ORDER — ADULT MULTIVITAMIN W/MINERALS CH: 1.0000 | ORAL_TABLET | Freq: Every day | ORAL | Status: AC

## 2015-12-31 MED ORDER — MEGESTROL ACETATE 400 MG/10ML PO SUSP: 400.0000 mg | Freq: Two times a day (BID) | ORAL | Status: AC

## 2015-12-31 NOTE — Progress Notes (Signed)
Nurse reviewed discharge instructions with the patient's daughter Fara BorosLisa, Lisa verbalized understanding and the family refused short term rehab.  IV and tele discontinued and CCMD notified.

## 2015-12-31 NOTE — Discharge Summary (Signed)
Physician Discharge Summary  Patient ID: Margie Billetlfred Munce MRN: [id number] DOB/AGE: 80/11/1924 80 y.o.  Admit date: 12/28/2015 Discharge date: 12/31/2015  Admission Diagnoses:  Discharge Diagnoses:  Active Problems:   Diabetes (HCC)   Essential hypertension   CKD (chronic kidney disease)   Hypokalemia   Hypertensive urgency   Chest pain   Accelerated hypertension   Diarrhea   Loss of weight   Chronic kidney disease   Protein-calorie malnutrition, severe   Discharged Condition: stable  Hospital Course: 82 y.o. male with medical history significant of hypertension, colon cancer and kidney disease. Patient was admitted with accelerated hypertension and chest pain. Patient ran out of his medication at home. Patient had marked electrolyte abnormality, specifically, hypokalemia, hypomagnesemia and hypocalcemia. On further questioning, patient endorsed history of chronic diarrhea and weight loss, with associated poor appetite. History of colon cancer is noted. Blood pressure has been optimized during the hospital stay. Need to comply with medication was discussed with the patient. Patient's daughter, Misty StanleyLisa was also updated. Abnormal electrolytes were corrected. Patient was also volume resuscitated. No significant diarrhea was noted during the hospital. Patient is back to his baseline and will be discharged back home today to the care of the PCP.    Discharge medication - Please see discharge Med rec.  Discharge Exam: Blood pressure 122/71, pulse 70, temperature 97.8 F (36.6 C), temperature source Oral, resp. rate 18, height 5\' 5"  (1.651 m), weight 58.605 kg (129 lb 3.2 oz), SpO2 98 %.    Disposition: 01-Home or Self Care  Discharge Instructions    Diet - low sodium heart healthy    Complete by:  As directed      Increase activity slowly    Complete by:  As directed             Medication List    STOP taking these medications        hydrochlorothiazide 25 MG tablet  Commonly known  as:  HYDRODIURIL     metoprolol 50 MG tablet  Commonly known as:  LOPRESSOR     traMADol 50 MG tablet  Commonly known as:  ULTRAM      TAKE these medications        amLODipine 10 MG tablet  Commonly known as:  NORVASC  Take 1 tablet (10 mg total) by mouth daily.     aspirin 81 MG EC tablet  Take 1 tablet (81 mg total) by mouth daily.     carvedilol 12.5 MG tablet  Commonly known as:  COREG  Take 1 tablet (12.5 mg total) by mouth 2 (two) times daily with a meal.     cloNIDine 0.2 MG tablet  Commonly known as:  CATAPRES  Take 1 tablet (0.2 mg total) by mouth 2 (two) times daily.     megestrol 400 MG/10ML suspension  Commonly known as:  MEGACE  Take 10 mLs (400 mg total) by mouth 2 (two) times daily.     multivitamin with minerals Tabs tablet  Take 1 tablet by mouth daily.     omeprazole 20 MG capsule  Commonly known as:  PRILOSEC  Take 1 capsule (20 mg total) by mouth daily.           Follow-up Information    Follow up with Oliver BarreJames John, MD In 1 week.   Specialties:  Internal Medicine, Radiology   Contact information:   [address] [place] [address] [phone]       Signed: Barnetta ChapelSylvester I Ogbata  12/31/2015, 9:01 AM

## 2015-12-31 NOTE — Evaluation (Signed)
Physical Therapy Evaluation Patient Details Name: [name] MRN: [id number] DOB: [birth year] Today's Date: 12/31/2015   History of Present Illness  Pt is a 82 y/o M who presented w/ HTN, SOB, and chest pain, he had ran out of his medicine at home.  Pt's PMH includes colon cancer, CKD, anemia, hearing loss Lt ear, Bil knee osteoarthritis, small intestine surgery.    Clinical Impression  Pt admitted with above diagnosis. Pt currently with functional limitations due to the deficits listed below (see PT Problem List). [name] presents w/ impaired cognition, decreased safety awareness, and is currently living alone as his wife is at a SNF due to recent fall.  He is a high fall risk, requiring min assist while ambulating, especially w/ high level balance activities as documented below.  Son in room and reports that no decisions have been made regarding pt's plan at d/c.  Son and pt to speak w/ pt's wife and make a decision as a family w/ this PTs recommendations in mind.  Given pt's impaired cognition and high fall risk, pt will need 24/7 assist/supervision at d/c.  Therefore, recommending SNF unless family able to provide this level of care.  Pt will benefit from skilled PT to increase their independence and safety with mobility to allow discharge to the venue listed below.      Follow Up Recommendations SNF;Home health PT;Supervision/Assistance - 24 hour    Equipment Recommendations  Rolling walker with 5" wheels    Recommendations for Other Services OT consult     Precautions / Restrictions Precautions Precautions: Fall Restrictions Weight Bearing Restrictions: No      Mobility  Bed Mobility Overal bed mobility: Needs Assistance Bed Mobility: Supine to Sit;Sit to Supine     Supine to sit: Supervision Sit to supine: Min guard   General bed mobility comments: Supervision for safety w/ supine>sit.  Min guard for safety for sit>supine as pt stands up several times for positioning and  scooting HOB.  Transfers Overall transfer level: Needs assistance Equipment used: 1 person hand held assist;None Transfers: Sit to/from Stand Sit to Stand: Min assist         General transfer comment: Min assist due to instability  Ambulation/Gait Ambulation/Gait assistance: Min assist Ambulation Distance (Feet): 100 Feet Assistive device: None;1 person hand held assist Gait Pattern/deviations: Step-through pattern;Decreased stride length;Wide base of support;Staggering right;Staggering left   Gait velocity interpretation: at or above normal speed for age/gender General Gait Details: Pt staggering Lt and Rt even w/ 1 person HHA, especially w/ high level balance activities as documented below.    Stairs            Wheelchair Mobility    Modified Rankin (Stroke Patients Only)       Balance Overall balance assessment: Needs assistance Sitting-balance support: No upper extremity supported;Feet supported Sitting balance-Leahy Scale: Fair     Standing balance support: Single extremity supported;During functional activity Standing balance-Leahy Scale: Poor Standing balance comment: Requires 1 person HHA and min assist at times to steady                 Standardized Balance Assessment Standardized Balance Assessment : Dynamic Gait Index   Dynamic Gait Index Level Surface: Mild Impairment Change in Gait Speed: Mild Impairment Gait with Horizontal Head Turns: Moderate Impairment Gait with Vertical Head Turns: Moderate Impairment Gait and Pivot Turn: Moderate Impairment Step Over Obstacle: Severe Impairment (assist to steady)       Pertinent Vitals/Pain Pain Assessment: No/denies pain  Home Living Family/patient expects to be discharged to:: Private residence Living Arrangements: Alone Available Help at Discharge: Family;Available PRN/intermittently Type of Home: House Home Access: Level entry     Home Layout: One level Home Equipment: Bedside  commode;Shower seat;Cane - single point Additional Comments: Pt's wife fell last week and fractured her knee and [facility]  Pt is currently living alone.    Prior Function Level of Independence: Independent with assistive device(s)         Comments: Uses cane at all times and denies h/o falls in the past 6 months.  Pt is a poor historian due to cognitive impairments at baseline (confirmed by son in room).  Pt reports he is able to don/doff his own shoes and socks at home on his low couch but requires assist today from this PT.  Pt says he sits on his shower chair for bathing and uses his BSC over his toilet.     Hand Dominance        Extremity/Trunk Assessment   Upper Extremity Assessment: Overall WFL for tasks assessed           Lower Extremity Assessment: Overall WFL for tasks assessed      Cervical / Trunk Assessment: Kyphotic  Communication   Communication: HOH  Cognition Arousal/Alertness: Awake/alert Behavior During Therapy: WFL for tasks assessed/performed Overall Cognitive Status: History of cognitive impairments - at baseline                      General Comments General comments (skin integrity, edema, etc.): Son in room and reports that no decision have been made regarding pt's plan at d/c.  Son and pt to speak w/ pt's wife and make a decision as a family w/ this PTs recommendations in mind (documented below).    Exercises        Assessment/Plan    PT Assessment Patient needs continued PT services  PT Diagnosis Difficulty walking;Abnormality of gait;Altered mental status   PT Problem List Decreased balance;Decreased mobility;Decreased cognition;Decreased safety awareness;Decreased knowledge of use of DME  PT Treatment Interventions DME instruction;Gait training;Functional mobility training;Therapeutic activities;Therapeutic exercise;Balance training;Cognitive remediation;Patient/family education   PT Goals (Current  goals can be found in the Care Plan section) Acute Rehab PT Goals Patient Stated Goal: to go home PT Goal Formulation: With patient/family Time For Goal Achievement: 01/14/16 Potential to Achieve Goals: Good    Frequency Min 3X/week   Barriers to discharge Decreased caregiver support lives alone    Co-evaluation               End of Session Equipment Utilized During Treatment: Gait belt Activity Tolerance: Patient tolerated treatment well Patient left: in bed;with call bell/phone within reach;with family/visitor present (son to notify RN if leaving room so bed alarm can be set) Nurse Communication: Mobility status;Other (comment) (d/c recommendations)    Functional Assessment Tool Used: Clinical Judgement Functional Limitation: Mobility: Walking and moving around Mobility: Walking and Moving Around Current Status [phone]([id number]): At least 20 percent but less than 40 percent impaired, limited or restricted Mobility: Walking and Moving Around Goal Status [phone]): At least 1 percent but less than 20 percent impaired, limited or restricted    Time: 0918-0940 PT Time Calculation (min) (ACUTE ONLY): 22 min   Charges:   PT Evaluation $PT Eval Low Complexity: 1 Procedure     PT G Codes:   PT G-Codes **NOT FOR INPATIENT CLASS** Functional Assessment Tool Used: Clinical Judgement Functional  Limitation: Mobility: Walking and moving around Mobility: Walking and Moving Around Current Status [phone]): At least 20 percent but less than 40 percent impaired, limited or restricted Mobility: Walking and Moving Around Goal Status [phone]): At least 1 percent but less than 20 percent impaired, limited or restricted   Encarnacion Chu PT, DPT  Pager: [phone] Phone: [phone] 12/31/2015, 9:57 AM

## 2016-01-01 LAB — CALCIUM, IONIZED: Calcium, Ionized, Serum: 3.6 mg/dL — ABNORMAL LOW (ref 4.5–5.6)

## 2016-01-02 ENCOUNTER — Telehealth: Payer: Self-pay | Admitting: *Deleted

## 2016-01-02 NOTE — Telephone Encounter (Signed)
[facility]  No answer LMOM RTC...[name]/lmb

## 2016-01-04 ENCOUNTER — Inpatient Hospital Stay (HOSPITAL_COMMUNITY): Payer: Commercial Managed Care - HMO

## 2016-01-04 ENCOUNTER — Inpatient Hospital Stay (HOSPITAL_COMMUNITY)
Admission: EM | Admit: 2016-01-04 | Discharge: 2016-01-19 | DRG: 551 | Disposition: E | Payer: Commercial Managed Care - HMO | Attending: Surgery | Admitting: Surgery

## 2016-01-04 ENCOUNTER — Encounter (HOSPITAL_COMMUNITY): Payer: Self-pay | Admitting: *Deleted

## 2016-01-04 ENCOUNTER — Emergency Department (HOSPITAL_COMMUNITY): Payer: Commercial Managed Care - HMO

## 2016-01-04 DIAGNOSIS — I10 Essential (primary) hypertension: Secondary | ICD-10-CM | POA: Diagnosis present

## 2016-01-04 DIAGNOSIS — W1830XA Fall on same level, unspecified, initial encounter: Secondary | ICD-10-CM | POA: Diagnosis present

## 2016-01-04 DIAGNOSIS — Y92000 Kitchen of unspecified non-institutional (private) residence as  the place of occurrence of the external cause: Secondary | ICD-10-CM | POA: Diagnosis not present

## 2016-01-04 DIAGNOSIS — D649 Anemia, unspecified: Secondary | ICD-10-CM | POA: Diagnosis not present

## 2016-01-04 DIAGNOSIS — Z66 Do not resuscitate: Secondary | ICD-10-CM | POA: Diagnosis present

## 2016-01-04 DIAGNOSIS — H9192 Unspecified hearing loss, left ear: Secondary | ICD-10-CM | POA: Diagnosis not present

## 2016-01-04 DIAGNOSIS — R32 Unspecified urinary incontinence: Secondary | ICD-10-CM | POA: Diagnosis not present

## 2016-01-04 DIAGNOSIS — Z79899 Other long term (current) drug therapy: Secondary | ICD-10-CM

## 2016-01-04 DIAGNOSIS — D638 Anemia in other chronic diseases classified elsewhere: Secondary | ICD-10-CM | POA: Diagnosis not present

## 2016-01-04 DIAGNOSIS — J9600 Acute respiratory failure, unspecified whether with hypoxia or hypercapnia: Secondary | ICD-10-CM | POA: Diagnosis not present

## 2016-01-04 DIAGNOSIS — D500 Iron deficiency anemia secondary to blood loss (chronic): Secondary | ICD-10-CM | POA: Diagnosis present

## 2016-01-04 DIAGNOSIS — M4802 Spinal stenosis, cervical region: Secondary | ICD-10-CM | POA: Diagnosis not present

## 2016-01-04 DIAGNOSIS — J988 Other specified respiratory disorders: Secondary | ICD-10-CM | POA: Insufficient documentation

## 2016-01-04 DIAGNOSIS — S129XXA Fracture of neck, unspecified, initial encounter: Secondary | ICD-10-CM | POA: Diagnosis present

## 2016-01-04 DIAGNOSIS — S0181XA Laceration without foreign body of other part of head, initial encounter: Secondary | ICD-10-CM | POA: Diagnosis present

## 2016-01-04 DIAGNOSIS — S12500A Unspecified displaced fracture of sixth cervical vertebra, initial encounter for closed fracture: Secondary | ICD-10-CM | POA: Diagnosis present

## 2016-01-04 DIAGNOSIS — I7100 Dissection of unspecified site of aorta: Secondary | ICD-10-CM | POA: Diagnosis not present

## 2016-01-04 DIAGNOSIS — M5030 Other cervical disc degeneration, unspecified cervical region: Secondary | ICD-10-CM | POA: Diagnosis present

## 2016-01-04 DIAGNOSIS — K219 Gastro-esophageal reflux disease without esophagitis: Secondary | ICD-10-CM | POA: Diagnosis present

## 2016-01-04 DIAGNOSIS — R609 Edema, unspecified: Secondary | ICD-10-CM

## 2016-01-04 DIAGNOSIS — E1122 Type 2 diabetes mellitus with diabetic chronic kidney disease: Secondary | ICD-10-CM | POA: Diagnosis not present

## 2016-01-04 DIAGNOSIS — S15111A Minor laceration of right vertebral artery, initial encounter: Secondary | ICD-10-CM | POA: Diagnosis present

## 2016-01-04 DIAGNOSIS — Z7982 Long term (current) use of aspirin: Secondary | ICD-10-CM | POA: Diagnosis not present

## 2016-01-04 DIAGNOSIS — Z23 Encounter for immunization: Secondary | ICD-10-CM

## 2016-01-04 DIAGNOSIS — S15112A Minor laceration of left vertebral artery, initial encounter: Secondary | ICD-10-CM | POA: Diagnosis present

## 2016-01-04 DIAGNOSIS — N189 Chronic kidney disease, unspecified: Secondary | ICD-10-CM | POA: Diagnosis not present

## 2016-01-04 DIAGNOSIS — Z01818 Encounter for other preprocedural examination: Secondary | ICD-10-CM

## 2016-01-04 DIAGNOSIS — E785 Hyperlipidemia, unspecified: Secondary | ICD-10-CM | POA: Diagnosis present

## 2016-01-04 DIAGNOSIS — I129 Hypertensive chronic kidney disease with stage 1 through stage 4 chronic kidney disease, or unspecified chronic kidney disease: Secondary | ICD-10-CM | POA: Diagnosis present

## 2016-01-04 DIAGNOSIS — Z87891 Personal history of nicotine dependence: Secondary | ICD-10-CM | POA: Diagnosis not present

## 2016-01-04 DIAGNOSIS — N183 Chronic kidney disease, stage 3 (moderate): Secondary | ICD-10-CM | POA: Diagnosis not present

## 2016-01-04 DIAGNOSIS — I959 Hypotension, unspecified: Secondary | ICD-10-CM | POA: Diagnosis present

## 2016-01-04 DIAGNOSIS — Z978 Presence of other specified devices: Secondary | ICD-10-CM

## 2016-01-04 LAB — CBC WITH DIFFERENTIAL/PLATELET
BASOS ABS: 0 10*3/uL (ref 0.0–0.1)
Basophils Relative: 0 %
Eosinophils Absolute: 0 10*3/uL (ref 0.0–0.7)
Eosinophils Relative: 0 %
HEMATOCRIT: 28.6 % — AB (ref 39.0–52.0)
Hemoglobin: 9.6 g/dL — ABNORMAL LOW (ref 13.0–17.0)
LYMPHS ABS: 0.7 10*3/uL (ref 0.7–4.0)
LYMPHS PCT: 15 %
MCH: 31 pg (ref 26.0–34.0)
MCHC: 33.6 g/dL (ref 30.0–36.0)
MCV: 92.3 fL (ref 78.0–100.0)
MONO ABS: 0.5 10*3/uL (ref 0.1–1.0)
MONOS PCT: 11 %
NEUTROS ABS: 3.5 10*3/uL (ref 1.7–7.7)
Neutrophils Relative %: 74 %
Platelets: 195 10*3/uL (ref 150–400)
RBC: 3.1 MIL/uL — ABNORMAL LOW (ref 4.22–5.81)
RDW: 12.8 % (ref 11.5–15.5)
WBC: 4.7 10*3/uL (ref 4.0–10.5)

## 2016-01-04 LAB — BASIC METABOLIC PANEL
ANION GAP: 8 (ref 5–15)
BUN: 29 mg/dL — AB (ref 6–20)
CO2: 21 mmol/L — AB (ref 22–32)
Calcium: 8.4 mg/dL — ABNORMAL LOW (ref 8.9–10.3)
Chloride: 103 mmol/L (ref 101–111)
Creatinine, Ser: 1.12 mg/dL (ref 0.61–1.24)
GFR calc Af Amer: >60 mL/min (ref >60)
GFR, EST NON AFRICAN AMERICAN: 57 mL/min — AB (ref >60)
GLUCOSE: 140 mg/dL — AB (ref 65–99)
Potassium: 4.6 mmol/L (ref 3.5–5.1)
Sodium: 132 mmol/L — ABNORMAL LOW (ref 135–145)

## 2016-01-04 LAB — I-STAT ARTERIAL BLOOD GAS, ED
ACID-BASE DEFICIT: 3 mmol/L — AB (ref 0.0–2.0)
Bicarbonate: 21.9 mEq/L (ref 20.0–24.0)
O2 SAT: 100 %
PH ART: 7.394 (ref 7.350–7.450)
TCO2: 23 mmol/L (ref 0–100)
pCO2 arterial: 36 mmHg (ref 35.0–45.0)
pO2, Arterial: 402 mmHg — ABNORMAL HIGH (ref 80.0–100.0)

## 2016-01-04 LAB — PROTIME-INR
INR: 1.42 (ref 0.00–1.49)
Prothrombin Time: 17.5 seconds — ABNORMAL HIGH (ref 11.6–15.2)

## 2016-01-04 LAB — TYPE AND SCREEN
ABO/RH(D): A POS
Antibody Screen: NEGATIVE

## 2016-01-04 LAB — APTT: aPTT: 31 seconds (ref 24–37)

## 2016-01-04 LAB — ABO/RH: ABO/RH(D): A POS

## 2016-01-04 MED ORDER — FENTANYL CITRATE (PF) 100 MCG/2ML IJ SOLN: 50.0000 ug | INTRAMUSCULAR | Status: DC | PRN

## 2016-01-04 MED ORDER — FENTANYL CITRATE (PF) 100 MCG/2ML IJ SOLN
50.0000 ug | INTRAMUSCULAR | Status: DC | PRN
  Administered 2016-01-05: 50 ug via INTRAVENOUS
  Filled 2016-01-04: qty 2

## 2016-01-04 MED ORDER — PANTOPRAZOLE SODIUM 40 MG IV SOLR
40.0000 mg | Freq: Every day | INTRAVENOUS | Status: DC
  Administered 2016-01-04: 40 mg via INTRAVENOUS
  Filled 2016-01-04 (×2): qty 40

## 2016-01-04 MED ORDER — IOPAMIDOL (ISOVUE-370) INJECTION 76%
50.0000 mL | Freq: Once | INTRAVENOUS | Status: AC | PRN
  Administered 2016-01-04: 50 mL via INTRAVENOUS

## 2016-01-04 MED ORDER — TETANUS-DIPHTH-ACELL PERTUSSIS 5-2.5-18.5 LF-MCG/0.5 IM SUSP
0.5000 mL | Freq: Once | INTRAMUSCULAR | Status: AC
  Administered 2016-01-04: 0.5 mL via INTRAMUSCULAR
  Filled 2016-01-04: qty 0.5

## 2016-01-04 MED ORDER — ROCURONIUM BROMIDE 50 MG/5ML IV SOLN
INTRAVENOUS | Status: AC | PRN
  Administered 2016-01-04: 80 mg via INTRAVENOUS

## 2016-01-04 MED ORDER — POTASSIUM CHLORIDE IN NACL 20-0.9 MEQ/L-% IV SOLN
INTRAVENOUS | Status: DC
  Administered 2016-01-04: 22:00:00 via INTRAVENOUS
  Filled 2016-01-04 (×4): qty 1000

## 2016-01-04 MED ORDER — ETOMIDATE 2 MG/ML IV SOLN
INTRAVENOUS | Status: AC | PRN
  Administered 2016-01-04: 20 mg via INTRAVENOUS

## 2016-01-04 MED ORDER — PROPOFOL 1000 MG/100ML IV EMUL
INTRAVENOUS | Status: AC
  Filled 2016-01-04: qty 100

## 2016-01-04 MED ORDER — PANTOPRAZOLE SODIUM 40 MG PO TBEC: 40.0000 mg | DELAYED_RELEASE_TABLET | Freq: Every day | ORAL | Status: DC

## 2016-01-04 MED ORDER — MIDAZOLAM HCL 2 MG/2ML IJ SOLN: 1.0000 mg | INTRAMUSCULAR | Status: DC | PRN

## 2016-01-04 MED ORDER — FENTANYL CITRATE (PF) 100 MCG/2ML IJ SOLN
50.0000 ug | INTRAMUSCULAR | Status: DC | PRN
  Administered 2016-01-04 – 2016-01-05 (×2): 50 ug via INTRAVENOUS
  Filled 2016-01-04 (×3): qty 2

## 2016-01-04 MED ORDER — LIDOCAINE-EPINEPHRINE 1 %-1:100000 IJ SOLN
10.0000 mL | Freq: Once | INTRAMUSCULAR | Status: AC
  Administered 2016-01-04: 10 mL
  Filled 2016-01-04: qty 1

## 2016-01-04 MED ORDER — ONDANSETRON HCL 4 MG PO TABS: 4.0000 mg | ORAL_TABLET | Freq: Four times a day (QID) | ORAL | Status: DC | PRN

## 2016-01-04 MED ORDER — SODIUM CHLORIDE 0.9 % IV BOLUS (SEPSIS)
500.0000 mL | Freq: Once | INTRAVENOUS | Status: AC
  Administered 2016-01-04: 500 mL via INTRAVENOUS

## 2016-01-04 MED ORDER — ONDANSETRON HCL 4 MG/2ML IJ SOLN: 4.0000 mg | Freq: Four times a day (QID) | INTRAMUSCULAR | Status: DC | PRN

## 2016-01-04 MED ORDER — ESMOLOL HCL-SODIUM CHLORIDE 2000 MG/100ML IV SOLN
25.0000 ug/kg/min | Freq: Once | INTRAVENOUS | Status: DC
  Filled 2016-01-04: qty 100

## 2016-01-04 MED ORDER — ANTISEPTIC ORAL RINSE SOLUTION (CORINZ)
7.0000 mL | Freq: Four times a day (QID) | OROMUCOSAL | Status: DC
  Administered 2016-01-05 (×2): 7 mL via OROMUCOSAL

## 2016-01-04 MED ORDER — MORPHINE SULFATE (PF) 4 MG/ML IV SOLN
6.0000 mg | Freq: Once | INTRAVENOUS | Status: AC
  Administered 2016-01-04: 6 mg via INTRAVENOUS
  Filled 2016-01-04: qty 2

## 2016-01-04 MED ORDER — PROPOFOL 1000 MG/100ML IV EMUL: 0.0000 ug/kg/min | INTRAVENOUS | Status: DC

## 2016-01-04 MED ORDER — PROPOFOL 1000 MG/100ML IV EMUL
5.0000 ug/kg/min | INTRAVENOUS | Status: DC
  Administered 2016-01-04: 5 ug/kg/min via INTRAVENOUS

## 2016-01-04 MED ORDER — CHLORHEXIDINE GLUCONATE 0.12% ORAL RINSE (MEDLINE KIT)
15.0000 mL | Freq: Two times a day (BID) | OROMUCOSAL | Status: DC
  Administered 2016-01-04: 15 mL via OROMUCOSAL

## 2016-01-04 NOTE — Consult Note (Signed)
Reason for Consult:  C6 avulsion chance fracture  Referring Physician:  Dr. Virgel Manifold  [name] is an 82 y.o. black male.  HPI: Patient was at his home this afternoon in his kitchen, and fell. He suffered a right frontal scalp laceration and was brought to the [facility] emergency room by EMS for evaluation. Patient was evaluated by Dr. Wilson Singer.  CT of the brain revealed a scalp hematoma, but CT of the cervical spine showed extensive cervical spondylosis and degenerative disc disease, with thick ventral bridging calcification of the anterior longitudinal ligament as well as evidence of OPLL, with essentially a bamboo spine.  However most significantly it shows a C6 evulsion chance fracture, that fractures through the anterior osteophyte over the C6 vertebral body and as well the ventral aspect of the inferior endplate of C6 with angulation, but also fractures through the lamina and facets of C6. There is certainly underlying cervical stenosis with the extent of OPLL.  Neurosurgical consultation was requested by Dr. Wilson Singer, the case and images were reviewed, and I have recommended admission to the medical service for overall comprehensive care, and that I will follow the patient as a neurosurgical consultant. He has been seen already by Dr. Maryland Pink in the [facility], who is admitting the patient.  Past Medical History:  Past Medical History  Diagnosis Date  . DIABETES MELLITUS, TYPE II 12/24/2007  . HYPERLIPIDEMIA 03/24/2007  . ANEMIA-NOS 06/02/2007  . HEARING LOSS, LEFT EAR 01/18/2010  . HYPERTENSION 03/24/2007  . PULMONARY NODULE 06/02/2007  . STRICTURE, ESOPHAGEAL 06/02/2007  . GERD 06/02/2007  . GASTROENTERITIS, ACUTE 05/30/2010  . CONSTIPATION 01/18/2010  . CHOLELITHIASIS 06/02/2007  . RENAL INSUFFICIENCY 06/02/2007  . OSTEOARTHRITIS, KNEES, BILATERAL 01/26/2008  . FEVER UNSPECIFIED 01/18/2010  . Abdominal pain, right upper quadrant 06/12/2007  . EPIGASTRIC TENDERNESS  01/18/2010  . COLON CANCER, HX OF 03/24/2007  . NEPHROLITHIASIS, HX OF 06/02/2007    Past Surgical History:  Past Surgical History  Procedure Laterality Date  . Small intestine surgery      Family History:  Family History  Problem Relation Age of Onset  . Hypertension Other   . Hypertension Father     Social History:  reports that he has quit smoking. He does not have any smokeless tobacco history on file. He reports that he does not drink alcohol or use illicit drugs.  Allergies: No Known Allergies  Medications: I have reviewed the patient's current medications.  ROS:  Patient is complaining of pain into the shoulders bilaterally, as well as posterior neck pain. He is immobilized in a trauma cervical collar, and is to be changed over to a Aspen cervical collar. He also describes some headache. He does not describe other current complaints.  Physical Examination: Patient is an elderly black male, immobilized in a trauma cervical collar, in no acute distress.  Blood pressure 156/86, pulse 67, temperature 97.9 F (36.6 C), temperature source Oral, resp. rate 16, SpO2 98 %. Lungs:  Clear to auscultation, symmetrical respiratory excursion.  Heart:  Regular rate and rhythm, no murmur.  Abdomen:  Soft, nondistended, bowel sounds diminished.  Extremity:  No clubbing, cyanosis, or edema.   Neurological Examination: Mental Status Examination:  Awake and alert, following commands.  Cranial Nerve Examination:  EOMI. Facial movements symmetrical.  Motor Examination:  Right deltoid and biceps 4/5. Right triceps, intrinsics, and grip 5/5. Left deltoid, biceps, triceps, intrinsics, and grip 5/5. Iliopsoas, quadriceps, dorsiflexor, and plantar flexor 5/5 bilaterally.  Sensory Examination:  Sensation intact to pinprick in the digits of the upper extremities as well as in the distal lower extremities.  Reflex Examination:   Absent at the biceps, brachioradialis, triceps, quadriceps, gastrocnemius  bilaterally. Toes upgoing bilaterally.  Gait and Stance Examination:  Not tested due to the nature the patient's condition.   Results for orders placed or performed during the hospital encounter of 01/07/2016 (from the past 48 hour(s))  CBC with Differential     Status: Abnormal   Collection Time: 01/02/2016  3:23 PM  Result Value Ref Range   WBC 4.7 4.0 - 10.5 K/uL   RBC 3.10 (L) 4.22 - 5.81 MIL/uL   Hemoglobin 9.6 (L) 13.0 - 17.0 g/dL   HCT 28.6 (L) 39.0 - 52.0 %   MCV 92.3 78.0 - 100.0 fL   MCH 31.0 26.0 - 34.0 pg   MCHC 33.6 30.0 - 36.0 g/dL   RDW 12.8 11.5 - 15.5 %   Platelets 195 150 - 400 K/uL   Neutrophils Relative % 74 %   Neutro Abs 3.5 1.7 - 7.7 K/uL   Lymphocytes Relative 15 %   Lymphs Abs 0.7 0.7 - 4.0 K/uL   Monocytes Relative 11 %   Monocytes Absolute 0.5 0.1 - 1.0 K/uL   Eosinophils Relative 0 %   Eosinophils Absolute 0.0 0.0 - 0.7 K/uL   Basophils Relative 0 %   Basophils Absolute 0.0 0.0 - 0.1 K/uL  Basic metabolic panel     Status: Abnormal   Collection Time: 01/08/2016  3:23 PM  Result Value Ref Range   Sodium 132 (L) 135 - 145 mmol/L   Potassium 4.6 3.5 - 5.1 mmol/L   Chloride 103 101 - 111 mmol/L   CO2 21 (L) 22 - 32 mmol/L   Glucose, Bld 140 (H) 65 - 99 mg/dL   BUN 29 (H) 6 - 20 mg/dL   Creatinine, Ser 1.12 0.61 - 1.24 mg/dL   Calcium 8.4 (L) 8.9 - 10.3 mg/dL   GFR calc non Af Amer 57 (L) >60 mL/min   GFR calc Af Amer >60 >60 mL/min    Comment: (NOTE) The eGFR has been calculated using the CKD EPI equation. This calculation has not been validated in all clinical situations. eGFR's persistently <60 mL/min signify possible Chronic Kidney Disease.    Anion gap 8 5 - 15    Dg Shoulder Right  01/08/2016  CLINICAL DATA:  Acute right shoulder pain after fall at home today. Initial encounter. EXAM: RIGHT SHOULDER - 2+ VIEW COMPARISON:  None. FINDINGS: There is no evidence of fracture or dislocation. There is no evidence of arthropathy or other focal bone  abnormality. Soft tissues are unremarkable. IMPRESSION: No significant abnormality seen in the right shoulder. Electronically Signed   By: Marijo Conception, M.D.   On: 01/15/2016 16:02   Ct Head Wo Contrast  01/03/2016  CLINICAL DATA:  Recent fall with right-sided head and neck pain, initial encounter EXAM: CT HEAD WITHOUT CONTRAST CT CERVICAL SPINE WITHOUT CONTRAST TECHNIQUE: Multidetector CT imaging of the head and cervical spine was performed following the standard protocol without intravenous contrast. Multiplanar CT image reconstructions of the cervical spine were also generated. COMPARISON:  12/28/2015, 12/07/2012 FINDINGS: CT HEAD FINDINGS Bony calvarium is intact. A right frontal and supraorbital scalp hematoma is noted. No underlying fracture is seen. Mild atrophic changes are noted. Chronic white matter ischemic change is seen. No findings to suggest acute hemorrhage, acute infarction or space-occupying mass lesion is identified. CT CERVICAL SPINE  FINDINGS Seven cervical segments are well visualized. Large anterior flowing osteophytes are again seen from C2 to C7. At the C6 level there is fracture through the anterior bridging osteophytes as well as the inferior aspect of the C6 vertebral body with evidence of increased lordosis at this level. Fractures through the C6 facets noted bilaterally. This also extends into the lamina at C6 bilaterally. Surrounding soft tissues show soft tissue density at the anterior aspect of C6 consistent with local soft tissue hemorrhage. Multilevel posterior osteophytes are noted particularly at C3-4 and C5-6. These contribute to central canal stenosis at these levels. IMPRESSION: CT of the head: No acute intracranial abnormality. Atrophy and chronic white matter ischemic changes are seen. Right frontal and supraorbital hematoma. CT of the cervical spine: Fracture through the inferior aspect of the C6 vertebral body and as well as large anterior bridging osteophytes  anteriorly at that level. Fractures are also noted through the facets and the lamina of C6. Increased lordosis at this level is seen. No other fractures are noted. Critical Value/emergent results were called by telephone at the time of interpretation on 01/13/2016 at 4:23 pm to Dr. Wilson Singer, who verbally acknowledged these results. Electronically Signed   By: Inez Catalina M.D.   On: 01/06/2016 16:26   Ct Cervical Spine Wo Contrast  12/21/2015  CLINICAL DATA:  Recent fall with right-sided head and neck pain, initial encounter EXAM: CT HEAD WITHOUT CONTRAST CT CERVICAL SPINE WITHOUT CONTRAST TECHNIQUE: Multidetector CT imaging of the head and cervical spine was performed following the standard protocol without intravenous contrast. Multiplanar CT image reconstructions of the cervical spine were also generated. COMPARISON:  12/28/2015, 12/07/2012 FINDINGS: CT HEAD FINDINGS Bony calvarium is intact. A right frontal and supraorbital scalp hematoma is noted. No underlying fracture is seen. Mild atrophic changes are noted. Chronic white matter ischemic change is seen. No findings to suggest acute hemorrhage, acute infarction or space-occupying mass lesion is identified. CT CERVICAL SPINE FINDINGS Seven cervical segments are well visualized. Large anterior flowing osteophytes are again seen from C2 to C7. At the C6 level there is fracture through the anterior bridging osteophytes as well as the inferior aspect of the C6 vertebral body with evidence of increased lordosis at this level. Fractures through the C6 facets noted bilaterally. This also extends into the lamina at C6 bilaterally. Surrounding soft tissues show soft tissue density at the anterior aspect of C6 consistent with local soft tissue hemorrhage. Multilevel posterior osteophytes are noted particularly at C3-4 and C5-6. These contribute to central canal stenosis at these levels. IMPRESSION: CT of the head: No acute intracranial abnormality. Atrophy and chronic  white matter ischemic changes are seen. Right frontal and supraorbital hematoma. CT of the cervical spine: Fracture through the inferior aspect of the C6 vertebral body and as well as large anterior bridging osteophytes anteriorly at that level. Fractures are also noted through the facets and the lamina of C6. Increased lordosis at this level is seen. No other fractures are noted. Critical Value/emergent results were called by telephone at the time of interpretation on 01/07/2016 at 4:23 pm to Dr. Wilson Singer, who verbally acknowledged these results. Electronically Signed   By: Inez Catalina M.D.   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  I reviewed his case and films with my partners Drs.  Kristeen Miss and [facility].  The overall consensus is that the fracture is not stable, but that surgical intervention has significant risks of failure any fusion construct due to the severity of the bony disruption and the poor quality of the underlying bone. Therefore we all feel that it would be best to treat this injury with nonsurgical management with immobilization in a Aspen cervical collar for an extended period of time. This fracture may take 6-12 months to heal.  I discussed the nature of the fracture and reviewed the CT images with the patient's daughter and son-in-law, [name] and [name].  We discussed the severity of the injury, and the options of nonsurgical and surgical management and the risks and concerns associated with each. Their questions were answered for them.  Hosie Spangle, MD 01/02/2016, 5:51 PM   Addendum: Subsequent to my assessment of the patient and discussing his situation with his family, the patient began to complain of difficulty catching his breath. Dr. Wilson Singer reassessed the  patient and found diaphragmatic breathing, and felt he was respiration and airway were unstable, and the patient was intubated by Dr. Wilson Singer and placed on a ventilator for mechanical ventilatory support. I anticipate the [facility] having the critical care medicine service take over his acute care.  Spoke again with the patient's daughter and son-in-law, regarding the patient's respiratory insufficiency and requirement for intubation and ventilatory support. I explained that this is unfortunately a poor prognostic sign, and that we'll need to closely monitor how he progresses over the next several days. Again their questions were answered for them.  Hosie Spangle, MD 12/23/2015. 6:34 PM

## 2016-01-04 NOTE — Consult Note (Signed)
[name]       ,[name] [place] Medical Record #[id number] Date of Birth: 27-Feb-1925  Referring: No ref. provider found Primary Care: Cathlean Cower, MD  Chief Complaint:    Chief Complaint  Patient presents with  . Fall  Multiple possible vascular injuries following a fall and C6 cervical spine injury-fracture  History of Present Illness:     [name] is a 82 y.o. male who presents for fall at home. The patient was in hospital few days ago with hypertensive crisis. He stayed several days and was d/c home About 1:30 pm today while trying to put plate in sink he fell unwitnesed.Marland KitchenHe  was brought to the emergency department and evaluation determined that he did have a unstable cervical spine fracture at the C6 level. He was evaluated by Dr. Sherwood Gambler and it was felt that at least initially nonoperative treatment would be the best plan. Apparently he was neurologically intact and then suddenly developed respiratory stridor and had endotracheal intubation performed. Heis now   sedated on the ventilator . He was found to have multiple vascular injuries on CT angiogram of the neck .  bilateral vertebral artery disruptions at the level of the cervical spine fracture, extensive hematoma in the mediastinum retroesophageal which likely led to the intubation, possible compression of the right common carotid the level of the sternum. What is less clear is the question of dissection of the arch aorta, may be streaming effect.   Current Activity/ Functional Status: Patient is not independent with mobility/ambulation, transfers, ADL's, IADL's.   Zubrod Score: At the time of surgery this patient's most appropriate activity status/level should be described as: []     0    Normal activity, no symptoms []     1    Restricted in physical strenuous activity but ambulatory, able to do out light work []     2    Ambulatory and  capable of self care, unable to do work activities, up and about                 more than 50%  Of the time                            []     3    Only limited self care, in bed greater than 50% of waking hours []     4    Completely disabled, no self care, confined to bed or chair [x]     5    Moribund  Past Medical History  Diagnosis Date  . DIABETES MELLITUS, TYPE II 12/24/2007  . HYPERLIPIDEMIA 03/24/2007  . ANEMIA-NOS 06/02/2007  . HEARING LOSS, LEFT EAR 01/18/2010  . HYPERTENSION 03/24/2007  . PULMONARY NODULE 06/02/2007  . STRICTURE, ESOPHAGEAL 06/02/2007  . GERD 06/02/2007  . GASTROENTERITIS, ACUTE 05/30/2010  . CONSTIPATION 01/18/2010  . CHOLELITHIASIS 06/02/2007  . RENAL INSUFFICIENCY 06/02/2007  . OSTEOARTHRITIS, KNEES, BILATERAL 01/26/2008  . FEVER UNSPECIFIED 01/18/2010  . Abdominal pain, right upper quadrant 06/12/2007  . EPIGASTRIC TENDERNESS 01/18/2010  . COLON CANCER, HX OF 03/24/2007  . NEPHROLITHIASIS, HX OF 06/02/2007    Past Surgical History  Procedure Laterality Date  . Small intestine surgery      History  Smoking status  . Former Smoker  Smokeless  tobacco  . Not on file   History  Alcohol Use No    Social History   Social History  . Marital Status: Married    Spouse Name: N/A  . Number of Children: 1  . Years of Education: N/A   Occupational History  . retired [employer]    Social History Main Topics  . Smoking status: Former Research scientist (life sciences)  . Smokeless tobacco: Not on file  . Alcohol Use: No  . Drug Use: No  . Sexual Activity: Not on file   Other Topics Concern  . Not on file   Social History Narrative    No Known Allergies  Current Facility-Administered Medications  Medication Dose Route Frequency Provider Last Rate Last Dose  . 0.9 % NaCl with KCl 20 mEq/ L  infusion   Intravenous Continuous Coralie Keens, MD      . Derrill Memo ON January 21, 2016] antiseptic oral rinse solution (CORINZ)  7 mL Mouth Rinse QID Coralie Keens, MD      . chlorhexidine  gluconate (SAGE KIT) (PERIDEX) 0.12 % solution 15 mL  15 mL Mouth Rinse BID Coralie Keens, MD      . esmolol (BREVIBLOC) 2000 mg / 100 mL (20 mg/mL) infusion  25-300 mcg/kg/min Intravenous Once Virgel Manifold, MD      . fentaNYL (SUBLIMAZE) injection 50 mcg  50 mcg Intravenous Q1H PRN Virgel Manifold, MD   50 mcg at 12/19/2015 2015  . ondansetron (ZOFRAN) tablet 4 mg  4 mg Oral Q6H PRN Coralie Keens, MD       Or  . ondansetron [facility]) injection 4 mg  4 mg Intravenous Q6H PRN Coralie Keens, MD      . pantoprazole (PROTONIX) EC tablet 40 mg  40 mg Oral Daily Coralie Keens, MD       Or  . pantoprazole (PROTONIX) injection 40 mg  40 mg Intravenous Daily Coralie Keens, MD      . propofol (DIPRIVAN) 1000 MG/100ML infusion  5-80 mcg/kg/min Intravenous Continuous Virgel Manifold, MD 3.5 mL/hr at 12/22/2015 1904 10 mcg/kg/min at 12/31/2015 1904  . propofol (DIPRIVAN) 1000 MG/100ML infusion            Current Outpatient Prescriptions  Medication Sig Dispense Refill  . aspirin EC 81 MG EC tablet Take 1 tablet (81 mg total) by mouth daily. 30 tablet 1  . carvedilol (COREG) 12.5 MG tablet Take 1 tablet (12.5 mg total) by mouth 2 (two) times daily with a meal. 60 tablet 1  . cloNIDine (CATAPRES) 0.2 MG tablet Take 1 tablet (0.2 mg total) by mouth 2 (two) times daily. 180 tablet 3  . megestrol (MEGACE) 400 MG/10ML suspension Take 10 mLs (400 mg total) by mouth 2 (two) times daily. 240 mL 0  . Multiple Vitamin (MULTIVITAMIN WITH MINERALS) TABS tablet Take 1 tablet by mouth daily. 30 tablet 1  . omeprazole (PRILOSEC) 20 MG capsule Take 1 capsule (20 mg total) by mouth daily. 90 capsule 3  . amLODipine (NORVASC) 10 MG tablet Take 1 tablet (10 mg total) by mouth daily. 90 tablet 3  . hydrochlorothiazide (HYDRODIURIL) 25 MG tablet Take 25 mg by mouth daily.    . metoprolol (LOPRESSOR) 50 MG tablet Take 50 mg by mouth 2 (two) times daily.    . traMADol (ULTRAM) 50 MG tablet Take 50 mg by mouth every 12  (twelve) hours as needed for moderate pain.       (Not in a hospital admission)  Family History  Problem Relation Age of  Onset  . Hypertension Other   . Hypertension Father      Review of Systems:  Sedated on vent    Cardiac Review of Systems: Y or N  Chest Pain [    ]  Resting SOB [   ] Exertional SOB  [  ]  Orthopnea [  ]   Pedal Edema [   ]    Palpitations [  ] Syncope  [  ]   Presyncope [   ]  General Review of Systems: [Y] = yes [  ]=no Constitional: recent weight change [  ]; anorexia [  ]; fatigue [  ]; nausea [  ]; night sweats [  ]; fever [  ]; or chills [  ]                                                               Dental: poor dentition[  ]; Last Dentist visit:   Eye : blurred vision [  ]; diplopia [   ]; vision changes [  ];  Amaurosis fugax[  ]; Resp: cough [  ];  wheezing[  ];  hemoptysis[  ]; shortness of breath[  ]; paroxysmal nocturnal dyspnea[  ]; dyspnea on exertion[  ]; or orthopnea[  ];  GI:  gallstones[  ], vomiting[  ];  dysphagia[  ]; melena[  ];  hematochezia [  ]; heartburn[  ];   Hx of  Colonoscopy[  ]; GU: kidney stones [  ]; hematuria[  ];   dysuria [  ];  nocturia[  ];  history of     obstruction [  ]; urinary frequency [  ]             Skin: rash, swelling[  ];, hair loss[  ];  peripheral edema[  ];  or itching[  ]; Musculosketetal: myalgias[  ];  joint swelling[  ];  joint erythema[  ];  joint pain[  ];  back pain[  ];  Heme/Lymph: bruising[  ];  bleeding[  ];  anemia[  ];  Neuro: TIA[  ];  headaches[  ];  stroke[  ];  vertigo[  ];  seizures[  ];   paresthesias[  ];  difficulty walking[  ];  Psych:depression[  ]; anxiety[  ];  Endocrine: diabetes[  ];  thyroid dysfunction[  ];  Immunizations: Flu [  ]; Pneumococcal[  ];  Other:  Physical Exam: BP 199/125 mmHg  Pulse 68  Temp(Src) 97.9 F (36.6 C) (Oral)  Resp 14  SpO2 55%   General appearance: sedated Head: Normocephalic, without obvious abnormality, atraumatic, scalp contusion, scalp  lesions, cutt over right eye and bruising around right eye Neck: swollen right neck Lymph nodes: Cervical, supraclavicular, and axillary nodes not able to be examined . and neck swollen esp on right, collar in place  Resp: diminished breath sounds bilaterally Back: symmetric, no curvature. ROM normal. No CVA tenderness. Cardio: regular rate and rhythm, S1, S2 normal, no murmur, click, rub or gallop GI: soft, non-tender; bowel sounds normal; no masses,  no organomegaly Extremities: varicose veins noted and venous stasis dermatitis noted Neurologic: unresponsive Palpable dp pt ,radial brachial arteries bilaterial Diagnostic Studies & Laboratory data:     Recent Radiology Findings:   Dg Shoulder Right  01/03/2016  CLINICAL DATA:  Acute right shoulder pain after fall at home today. Initial encounter. EXAM: RIGHT SHOULDER - 2+ VIEW COMPARISON:  None. FINDINGS: There is no evidence of fracture or dislocation. There is no evidence of arthropathy or other focal bone abnormality. Soft tissues are unremarkable. IMPRESSION: No significant abnormality seen in the right shoulder. Electronically Signed   By: Marijo Conception, M.D.   On: 12/28/2015 16:02   Ct Head Wo Contrast  01/16/2016  CLINICAL DATA:  Recent fall with right-sided head and neck pain, initial encounter EXAM: CT HEAD WITHOUT CONTRAST CT CERVICAL SPINE WITHOUT CONTRAST TECHNIQUE: Multidetector CT imaging of the head and cervical spine was performed following the standard protocol without intravenous contrast. Multiplanar CT image reconstructions of the cervical spine were also generated. COMPARISON:  12/28/2015, 12/07/2012 FINDINGS: CT HEAD FINDINGS Bony calvarium is intact. A right frontal and supraorbital scalp hematoma is noted. No underlying fracture is seen. Mild atrophic changes are noted. Chronic white matter ischemic change is seen. No findings to suggest acute hemorrhage, acute infarction or space-occupying mass lesion is identified. CT  CERVICAL SPINE FINDINGS Seven cervical segments are well visualized. Large anterior flowing osteophytes are again seen from C2 to C7. At the C6 level there is fracture through the anterior bridging osteophytes as well as the inferior aspect of the C6 vertebral body with evidence of increased lordosis at this level. Fractures through the C6 facets noted bilaterally. This also extends into the lamina at C6 bilaterally. Surrounding soft tissues show soft tissue density at the anterior aspect of C6 consistent with local soft tissue hemorrhage. Multilevel posterior osteophytes are noted particularly at C3-4 and C5-6. These contribute to central canal stenosis at these levels. IMPRESSION: CT of the head: No acute intracranial abnormality. Atrophy and chronic white matter ischemic changes are seen. Right frontal and supraorbital hematoma. CT of the cervical spine: Fracture through the inferior aspect of the C6 vertebral body and as well as large anterior bridging osteophytes anteriorly at that level. Fractures are also noted through the facets and the lamina of C6. Increased lordosis at this level is seen. No other fractures are noted. Critical Value/emergent results were called by telephone at the time of interpretation on 12/31/2015 at 4:23 pm to Dr. Wilson Singer, who verbally acknowledged these results. Electronically Signed   By: Inez Catalina M.D.   On: 12/31/2015 16:26   Ct Angio Neck W/cm &/or Wo/cm  12/31/2015  CLINICAL DATA:  Cervical spine fracture, initial encounter. Progressive neck swelling and intubation. EXAM: CT ANGIOGRAPHY NECK TECHNIQUE: Multidetector CT imaging of the neck was performed using the standard protocol during bolus administration of intravenous contrast. Multiplanar CT image reconstructions and MIPs were obtained to evaluate the vascular anatomy. Carotid stenosis measurements (when applicable) are obtained utilizing NASCET criteria, using the distal internal carotid diameter as the denominator.  CONTRAST:  50 mL Isovue 370 COMPARISON:  CT of the head and cervical spine from the same day. FINDINGS: Aortic arch: There is diffuse regularity of contrast along the concave aspect of the aortic arch with abnormal attenuation extending into the innominate artery compatible with an acute aortic arch injury and traumatic dissection. The aortic dissection extends into the descending thoracic aorta. Right carotid system: The right common carotid artery is narrowed to 2.3 mm just beyond its origin compared with 5.9 mm more distally. This appears to be secondary to extrinsic compression. The more distal right common carotid artery is within normal limits. A posterior calcified plaque is present at the carotid bifurcation.  The cervical ICA demonstrates additional distal posterior mural plaque, likely related to remote traumatic injury. No other focal stenosis is present. Left carotid system: The left common carotid artery is within normal limits. Noncalcified plaque is seen posteriorly at the bifurcation and proximal left ICA. The lumen and is narrowed to 2.5 mm. Additional calcified and noncalcified plaque is seen in the distal left ICA, again suggesting remote trauma. Vertebral arteries:The vertebral arteries are not visualized. The fracture extends through the foramen transversarium likely reflecting dissection of the vertebral arteries bilaterally. Skeleton: The oblique fracture of the C6 vertebral body and bilateral foramen transversarium is again noted. The fracture and increased lordosis is stable. Diffuse ankylosis of the cervical spine is noted otherwise. Other neck: There is a progressive retropharyngeal and prevertebral hematoma which dissects into the superior mediastinum. The inferior extent of the hematoma is not visualized but extends inferiorly posterior to the trachea to at least the level of T7. There is partial collapse of the left lower lobe. Bilateral pleural effusions are present. There is no  pneumothorax. The visualized ribs are intact. Extensive paraspinal edema and hemorrhage in the soft tissues has increased significantly since the earlier study. IMPRESSION: 1. Progressive retropharyngeal and prevertebral hematoma extending from the C6 fracture inferiorly into the mediastinum posterior to the airway and esophagus to at least the level of T7. 2. The hematoma has progressed since the earlier CT scan. 3. Traumatic type A aortic dissection with abnormal flow into the innominate artery. 4. 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 There is no flow seen in the vertebral arteries or in the basilar artery. 5. Atherosclerotic disease within the carotid bifurcations bilaterally without significant stenosis. 6. Evidence for remote trauma in the high cervical internal carotid arteries bilaterally. 7. Diminished contrast within the subclavian arteries bilaterally, potentially related to decreased perfusion from the arch. 8. Focal narrowing of the proximal right common carotid artery likely due to extrinsic compression. These results were called by telephone at the time of interpretation on 01/01/2016 at 8:06pm to Dr. Wilson Singer, who verbally acknowledged these results. I also discussed the case with Dr. Nedra Hai at 8:06 p.m. and Dr. Mamie Nick at 8:45 p.m. Electronically Signed   By: San Morelle M.D.   On: 01/11/2016 20:53   Ct Cervical Spine Wo Contrast  01/13/2016  CLINICAL DATA:  Recent fall with right-sided head and neck pain, initial encounter EXAM: CT HEAD WITHOUT CONTRAST CT CERVICAL SPINE WITHOUT CONTRAST TECHNIQUE: Multidetector CT imaging of the head and cervical spine was performed following the standard protocol without intravenous contrast. Multiplanar CT image reconstructions of the cervical spine were also generated. COMPARISON:  12/28/2015, 12/07/2012 FINDINGS: CT HEAD FINDINGS Bony calvarium is intact. A right frontal and supraorbital scalp hematoma is noted.  No underlying fracture is seen. Mild atrophic changes are noted. Chronic white matter ischemic change is seen. No findings to suggest acute hemorrhage, acute infarction or space-occupying mass lesion is identified. CT CERVICAL SPINE FINDINGS Seven cervical segments are well visualized. Large anterior flowing osteophytes are again seen from C2 to C7. At the C6 level there is fracture through the anterior bridging osteophytes as well as the inferior aspect of the C6 vertebral body with evidence of increased lordosis at this level. Fractures through the C6 facets noted bilaterally. This also extends into the lamina at C6 bilaterally. Surrounding soft tissues show soft tissue density at the anterior aspect of C6 consistent with local soft tissue hemorrhage. Multilevel posterior osteophytes are noted particularly at C3-4  and C5-6. These contribute to central canal stenosis at these levels. IMPRESSION: CT of the head: No acute intracranial abnormality. Atrophy and chronic white matter ischemic changes are seen. Right frontal and supraorbital hematoma. CT of the cervical spine: Fracture through the inferior aspect of the C6 vertebral body and as well as large anterior bridging osteophytes anteriorly at that level. Fractures are also noted through the facets and the lamina of C6. Increased lordosis at this level is seen. No other fractures are noted. Critical Value/emergent results were called by telephone at the time of interpretation on 12/19/2015 at 4:23 pm to Dr. Wilson Singer, who verbally acknowledged these results. Electronically Signed   By: Inez Catalina M.D.   On: 12/31/2015 16:26   Dg Chest Port 1 View  12/24/2015  CLINICAL DATA:  Endotracheal tube placement, diabetes mellitus, hypertension, hyperlipidemia, history colon cancer EXAM: PORTABLE CHEST 1 VIEW COMPARISON:  Portable exam 1838 hours compared to 12/28/2015 FINDINGS: Tip of endotracheal tube projects approximately 8.1 cm above carina, at the level of clavicular  heads. Lordotic positioning. Stable heart size and pulmonary vascularity. Accentuation of superior mediastinum may be related to lordosis. Skin fold projects over LEFT chest. Lungs grossly clear without infiltrate, pleural effusion or pneumothorax. Osseous demineralization with BILATERAL glenohumeral degenerative changes. IMPRESSION: Tip of endotracheal tube projects at clavicular heads approximately 8.1 cm above carina. Clear lungs. Electronically Signed   By: Lavonia Dana M.D.   On: 01/14/2016 18:44     I have independently reviewed the above radiologic studies.  Recent Lab Findings: Lab Results  Component Value Date   WBC 4.7 01/13/2016   HGB 9.6* 01/02/2016   HCT 28.6* 01/16/2016   PLT 195 12/31/2015   GLUCOSE 140* 12/27/2015   CHOL 107 01/11/2015   TRIG 102.0 01/11/2015   HDL 37.70* 01/11/2015   LDLCALC 49 01/11/2015   ALT 12* 12/31/2015   AST 22 12/31/2015   NA 132* 12/31/2015   K 4.6 01/14/2016   CL 103 12/26/2015   CREATININE 1.12 01/11/2016   BUN 29* 01/06/2016   CO2 21* 01/06/2016   TSH 2.939 12/29/2015   INR 1.1 05/15/2007   HGBA1C 6.6* 12/29/2015      Assessment / Plan:    1/Elderly man with advanced degenerative changes in the cervical spine with 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    I  spent 40 minutes counseling the patient face to face and 50% or more the  time was spent in counseling and coordination  of care. The total time spent in the appointment was 60 minutes.    Grace Isaac MD      [address] [place] [address] Office [phone]   Beeper [phone]  01/13/2016 9:34 PM

## 2016-01-04 NOTE — Procedures (Signed)
Intubation Procedure Note Chase Billetlfred [name] [id number] 11/29/1924  Procedure: Intubation Indications: Airway protection and maintenance  Procedure Details Consent: Unable to obtain consent because of altered level of consciousness. Time Out: Verified patient identification, verified procedure, site/side was marked, verified correct patient position, special equipment/implants available, medications/allergies/relevent history reviewed, required imaging and test results available.  Performed  Maximum sterile technique was used including antiseptics, cap, gloves, gown, hand hygiene, mask and sheet.  MAC and 4    Evaluation Hemodynamic Status: BP stable throughout; O2 sats: stable throughout Patient's Current Condition: stable Complications: No apparent complications Patient did tolerate procedure well. Chest X-ray ordered to verify placement.  CXR: pending   Patient intubated by ED MD without complications.  Positive color change noted.  Condensation noted in tube.  Bilateral breath sounds heard.  Chest xray pending for tube placement.    [name], [name] N 01/16/2016

## 2016-01-04 NOTE — Consult Note (Signed)
Vascular Surgery Consultation  Reason for Consult: Multiple possible vascular injuries following a fall and C6 cervical spine injury-fracture  HPI: [name] is a 82 y.o. male who presents for evaluation of multiple vascular injuries. This patient apparently fell at his home while walking in the kitchen and apparently his 4 head fit on the Causing hyperextension and laceration area he was brought to the emergency department and evaluation determined that he did have a unstable cervical spine fracture at the C6 level. He was evaluated by Dr. Newell CoralNudelman and it was felt that at least initially nonoperative treatment would be the best plan. Apparently he was neurologically intact and then suddenly developed respiratory stridor and had endotracheal intubation performed. He has been sedated on the ventilator since that time and is. He was found to have multiple vascular injuries on CT angiogram that was subsequently performed. This included what appears to be a partial transection or laceration of the ascending aorta, bilateral vertebral artery disruptions at the level of the cervical spine fracture, extensive hematoma in the mediastinum retroesophageal which likely led to the intubation, possible compression of the right common carotid the level of the sternum.   Past Medical History  Diagnosis Date  . DIABETES MELLITUS, TYPE II 12/24/2007  . HYPERLIPIDEMIA 03/24/2007  . ANEMIA-NOS 06/02/2007  . HEARING LOSS, LEFT EAR 01/18/2010  . HYPERTENSION 03/24/2007  . PULMONARY NODULE 06/02/2007  . STRICTURE, ESOPHAGEAL 06/02/2007  . GERD 06/02/2007  . GASTROENTERITIS, ACUTE 05/30/2010  . CONSTIPATION 01/18/2010  . CHOLELITHIASIS 06/02/2007  . RENAL INSUFFICIENCY 06/02/2007  . OSTEOARTHRITIS, KNEES, BILATERAL 01/26/2008  . FEVER UNSPECIFIED 01/18/2010  . Abdominal pain, right upper quadrant 06/12/2007  . EPIGASTRIC TENDERNESS 01/18/2010  . COLON CANCER, HX OF 03/24/2007  . NEPHROLITHIASIS, HX OF 06/02/2007   Past  Surgical History  Procedure Laterality Date  . Small intestine surgery     Social History   Social History  . Marital Status: Married    Spouse Name: N/A  . Number of Children: 1  . Years of Education: N/A   Occupational History  . retired [employer]    Social History Main Topics  . Smoking status: Former Games developermoker  . Smokeless tobacco: None  . Alcohol Use: No  . Drug Use: No  . Sexual Activity: Not Asked   Other Topics Concern  . None   Social History Narrative   Family History  Problem Relation Age of Onset  . Hypertension Other   . Hypertension Father    No Known Allergies Prior to Admission medications   Medication Sig Start Date End Date Taking? Authorizing Provider  aspirin EC 81 MG EC tablet Take 1 tablet (81 mg total) by mouth daily. 12/31/15  Yes Barnetta ChapelSylvester I Ogbata, MD  carvedilol (COREG) 12.5 MG tablet Take 1 tablet (12.5 mg total) by mouth 2 (two) times daily with a meal. 12/31/15  Yes Barnetta ChapelSylvester I Ogbata, MD  cloNIDine (CATAPRES) 0.2 MG tablet Take 1 tablet (0.2 mg total) by mouth 2 (two) times daily. 12/13/15  Yes Corwin LevinsJames W John, MD  megestrol (MEGACE) 400 MG/10ML suspension Take 10 mLs (400 mg total) by mouth 2 (two) times daily. 12/31/15  Yes Barnetta ChapelSylvester I Ogbata, MD  Multiple Vitamin (MULTIVITAMIN WITH MINERALS) TABS tablet Take 1 tablet by mouth daily. 12/31/15  Yes Barnetta ChapelSylvester I Ogbata, MD  omeprazole (PRILOSEC) 20 MG capsule Take 1 capsule (20 mg total) by mouth daily. 12/13/15  Yes Corwin LevinsJames W John, MD  amLODipine (NORVASC) 10 MG tablet Take 1 tablet (10  mg total) by mouth daily. 12/13/15   Corwin Levins, MD  hydrochlorothiazide (HYDRODIURIL) 25 MG tablet Take 25 mg by mouth daily.    Historical Provider, MD  metoprolol (LOPRESSOR) 50 MG tablet Take 50 mg by mouth 2 (two) times daily.    Historical Provider, MD  traMADol (ULTRAM) 50 MG tablet Take 50 mg by mouth every 12 (twelve) hours as needed for moderate pain.    Historical Provider, MD     Positive ROS:   All  other systems have been reviewed and were otherwise negative with the exception of those mentioned in the HPI and as above.  Physical Exam: Filed Vitals:   12/24/2015 1940 12/21/2015 2000  BP: 135/84 199/125  Pulse:    Temp:    Resp: 14 14    General: Sedated on the ventilator HEENT: Normal for age-right frontal scalp laceration  Cardiovascular: Regular rate and rhythm. Carotid pulses 3+ on left 1+ on the right-good Doppler flow in bilateral carotid arteries Respiratory: Clear to auscultation. No cyanosis, no use of accessory musculature GI: No organomegaly, abdomen is soft and non-tender Skin: No lesions in the area of chief complaint Neurologic:-Unable to evaluate  Musculoskeletal: No obvious deformities Extremities: 2-3+ radial pulses palpable bilaterally with positive Doppler flow    Imaging reviewed: CT angiogram of the neck and chest was reviewed with Dr. Gennette Pac. Patient does appear to have transection type injury of the ascending aortic arch, no flow in bilateral vertebral arteries likely due to transection or disruption at the level of the C6 spine fracture, some extrinsic compression of right common carotid artery at the origin with flow distally, extensive mediastinal hematoma retroesophageal extending distally into the chest as far as the scan was performed, possible sluggish but intact flow to bilateral subclavian arteries.   Assessment/Plan:  #1 unstable cervical spine fracture #2 probable disruption bilateral vertebral arteries at the level of cervical spine fracture-no way to treat this injury regarding vertebral arteries #3 extrinsic compression right common carotid artery at the level of the sternum with good flow distally-does not need treatment #4 possible disruption ascending aorta-evaluated by Dr. Tyrone Sage  No treatment indicated or possible regarding peripheral vascular injuries Suspect best plan will be to observe patient on ventilator and watch for  recovery Neurosurgery service to follow regarding cervical spine fracture which is unstable    Josephina Gip, MD 01/11/2016 9:05 PM

## 2016-01-04 NOTE — Consult Note (Signed)
PULMONARY / CRITICAL CARE MEDICINE   Name: [name] MRN: [id number] DOB: Oct 01, 1924    ADMISSION DATE:  01/03/2016 CONSULTATION DATE:  01/02/2016  REFERRING MD:  CCS  CHIEF COMPLAINT:  Fall  HISTORY OF PRESENT ILLNESS:   82 year old male with past medical history as below, which includes diabetes, hypertension, esophageal stricture, renal insufficiency, and colon cancer. He was at home in his usual state of health 5/17 when he suffered a fall during which he struck the side of his head against the counter. He did not lose consciousness. Upon presentation to the emergency department is complaining of right-sided head pain where he had a laceration just above his right eye. Initially his mental status was intact until he developed respiratory stridor and shortness of breath requiring intubation. He was found to have suffered a C6 fracture which was considered nonoperative on neurosurgical evaluation. Due to the development of stridor CT angiogram of the neck and chest were ordered which demonstrated transection type injury of the ascending aortic arch and no flow in bilateral vertebral arteries likely due to transection or disruption at the level of C6 by fracture. Vascular surgery also consider him to be nonoperative candidate. He was evaluated by cardiovascular thoracic surgery as well for the aortic arch injury, however, in their perspective he would not be considered for surgical intervention. PCCM has been asked as to assist with ICU care.  PAST MEDICAL HISTORY :  He  has a past medical history of DIABETES MELLITUS, TYPE II (12/24/2007); HYPERLIPIDEMIA (03/24/2007); ANEMIA-NOS (06/02/2007); HEARING LOSS, LEFT EAR (01/18/2010); HYPERTENSION (03/24/2007); PULMONARY NODULE (06/02/2007); STRICTURE, ESOPHAGEAL (06/02/2007); GERD (06/02/2007); GASTROENTERITIS, ACUTE (05/30/2010); CONSTIPATION (01/18/2010); CHOLELITHIASIS (06/02/2007); RENAL INSUFFICIENCY (06/02/2007); OSTEOARTHRITIS, KNEES, BILATERAL (01/26/2008);  FEVER UNSPECIFIED (01/18/2010); Abdominal pain, right upper quadrant (06/12/2007); EPIGASTRIC TENDERNESS (01/18/2010); COLON CANCER, HX OF (03/24/2007); and NEPHROLITHIASIS, HX OF (06/02/2007).  PAST SURGICAL HISTORY: He  has past surgical history that includes Small intestine surgery.  No Known Allergies  No current facility-administered medications on file prior to encounter.   Current Outpatient Prescriptions on File Prior to Encounter  Medication Sig  . aspirin EC 81 MG EC tablet Take 1 tablet (81 mg total) by mouth daily.  . carvedilol (COREG) 12.5 MG tablet Take 1 tablet (12.5 mg total) by mouth 2 (two) times daily with a meal.  . cloNIDine (CATAPRES) 0.2 MG tablet Take 1 tablet (0.2 mg total) by mouth 2 (two) times daily.  . megestrol (MEGACE) 400 MG/10ML suspension Take 10 mLs (400 mg total) by mouth 2 (two) times daily.  . Multiple Vitamin (MULTIVITAMIN WITH MINERALS) TABS tablet Take 1 tablet by mouth daily.  Marland Kitchen omeprazole (PRILOSEC) 20 MG capsule Take 1 capsule (20 mg total) by mouth daily.  Marland Kitchen amLODipine (NORVASC) 10 MG tablet Take 1 tablet (10 mg total) by mouth daily.    FAMILY HISTORY:  His indicated that his mother is deceased. He indicated that his father is deceased.   SOCIAL HISTORY: He  reports that he has quit smoking. He does not have any smokeless tobacco history on file. He reports that he does not drink alcohol or use illicit drugs.  REVIEW OF SYSTEMS:  Unable, vented, sedated   SUBJECTIVE:  No pressors   VITAL SIGNS: BP 170/100 mmHg  Pulse 77  Temp(Src) 97.9 F (36.6 C) (Oral)  Resp 16  SpO2 100%  HEMODYNAMICS:    VENTILATOR SETTINGS: Vent Mode:  [-] PRVC FiO2 (%):  [40 %-100 %] 40 % Set Rate:  [14 bmp] 14 bmp Vt Set:  [  500 mL] 500 mL PEEP:  [5 cmH20] 5 cmH20 Plateau Pressure:  [14 cmH20-16 cmH20] 14 cmH20  INTAKE / OUTPUT:    PHYSICAL EXAMINATION: General:  Elderly male on vent Neuro:  Sedated but arouses spontaneously, follows  commands HEENT:  Close Lac above R eye, periorbital edema, PERRL. C-collar in place Cardiovascular:  RRR, no MRG. Muffled heart sounds Lungs:  Clear bilateral breath sounds Abdomen:  Soft, non-distended Musculoskeletal:  No acute deformity, +1 BLE edema Skin:  R eye lac  LABS:  BMET  Recent Labs Lab 12/30/15 1100 12/31/15 0603 12/20/2015 1523  NA 138 139 132*  K 4.1 4.4 4.6  CL 106 105 103  CO2 24 25 21*  BUN 20 21* 29*  CREATININE 1.53* 1.47* 1.12  GLUCOSE 113* 114* 140*    Electrolytes  Recent Labs Lab 12/29/15 0426  12/30/15 0145 12/30/15 1100 12/31/15 0603 01/07/2016 1523  CALCIUM 6.0*  < >  --  6.2* 7.1* 8.4*  MG 0.4*  --  1.4*  --  1.7  --   PHOS 3.2  --   --  2.9 2.6  --   < > = values in this interval not displayed.  CBC  Recent Labs Lab 12/29/15 0426 12/21/2015 1523  WBC 5.6 4.7  HGB 9.6* 9.6*  HCT 30.0* 28.6*  PLT 179 195    Coag's  Recent Labs Lab 12/30/2015 2038  APTT 31  INR 1.42    Sepsis Markers No results for input(s): LATICACIDVEN, PROCALCITON, O2SATVEN in the last 168 hours.  ABG  Recent Labs Lab 01/13/2016 2016  PHART 7.394  PCO2ART 36.0  PO2ART 402.0*    Liver Enzymes  Recent Labs Lab 12/29/15 0426 12/30/15 1100 12/31/15 0603  AST 27 23 22   ALT 11* 8* 12*  ALKPHOS 40 37* 40  BILITOT 0.9 1.0 0.6  ALBUMIN 2.9* 2.5* 2.4*    Cardiac Enzymes  Recent Labs Lab 12/29/15 0908 12/29/15 1444 12/29/15 2025  TROPONINI 0.04* 0.05* 0.03    Glucose No results for input(s): GLUCAP in the last 168 hours.  Imaging Dg Shoulder Right  12/20/2015  CLINICAL DATA:  Acute right shoulder pain after fall at home today. Initial encounter. EXAM: RIGHT SHOULDER - 2+ VIEW COMPARISON:  None. FINDINGS: There is no evidence of fracture or dislocation. There is no evidence of arthropathy or other focal bone abnormality. Soft tissues are unremarkable. IMPRESSION: No significant abnormality seen in the right shoulder. Electronically Signed    By: Lupita Raider, M.D.   On: 01/08/2016 16:02   Ct Head Wo Contrast  12/21/2015  CLINICAL DATA:  Recent fall with right-sided head and neck pain, initial encounter EXAM: CT HEAD WITHOUT CONTRAST CT CERVICAL SPINE WITHOUT CONTRAST TECHNIQUE: Multidetector CT imaging of the head and cervical spine was performed following the standard protocol without intravenous contrast. Multiplanar CT image reconstructions of the cervical spine were also generated. COMPARISON:  12/28/2015, 12/07/2012 FINDINGS: CT HEAD FINDINGS Bony calvarium is intact. A right frontal and supraorbital scalp hematoma is noted. No underlying fracture is seen. Mild atrophic changes are noted. Chronic white matter ischemic change is seen. No findings to suggest acute hemorrhage, acute infarction or space-occupying mass lesion is identified. CT CERVICAL SPINE FINDINGS Seven cervical segments are well visualized. Large anterior flowing osteophytes are again seen from C2 to C7. At the C6 level there is fracture through the anterior bridging osteophytes as well as the inferior aspect of the C6 vertebral body with evidence of increased lordosis at this level.  Fractures through the C6 facets noted bilaterally. This also extends into the lamina at C6 bilaterally. Surrounding soft tissues show soft tissue density at the anterior aspect of C6 consistent with local soft tissue hemorrhage. Multilevel posterior osteophytes are noted particularly at C3-4 and C5-6. These contribute to central canal stenosis at these levels. IMPRESSION: CT of the head: No acute intracranial abnormality. Atrophy and chronic white matter ischemic changes are seen. Right frontal and supraorbital hematoma. CT of the cervical spine: Fracture through the inferior aspect of the C6 vertebral body and as well as large anterior bridging osteophytes anteriorly at that level. Fractures are also noted through the facets and the lamina of C6. Increased lordosis at this level is seen. No other  fractures are noted. Critical Value/emergent results were called by telephone at the time of interpretation on 12/22/2015 at 4:23 pm to Dr. Juleen China, who verbally acknowledged these results. Electronically Signed   By: Alcide Clever M.D.   On: 12/26/2015 16:26   Ct Angio Neck W/cm &/or Wo/cm  01/08/2016  CLINICAL DATA:  Cervical spine fracture, initial encounter. Progressive neck swelling and intubation. EXAM: CT ANGIOGRAPHY NECK TECHNIQUE: Multidetector CT imaging of the neck was performed using the standard protocol during bolus administration of intravenous contrast. Multiplanar CT image reconstructions and MIPs were obtained to evaluate the vascular anatomy. Carotid stenosis measurements (when applicable) are obtained utilizing NASCET criteria, using the distal internal carotid diameter as the denominator. CONTRAST:  50 mL Isovue 370 COMPARISON:  CT of the head and cervical spine from the same day. FINDINGS: Aortic arch: There is diffuse regularity of contrast along the concave aspect of the aortic arch with abnormal attenuation extending into the innominate artery compatible with an acute aortic arch injury and traumatic dissection. The aortic dissection extends into the descending thoracic aorta. Right carotid system: The right common carotid artery is narrowed to 2.3 mm just beyond its origin compared with 5.9 mm more distally. This appears to be secondary to extrinsic compression. The more distal right common carotid artery is within normal limits. A posterior calcified plaque is present at the carotid bifurcation. The cervical ICA demonstrates additional distal posterior mural plaque, likely related to remote traumatic injury. No other focal stenosis is present. Left carotid system: The left common carotid artery is within normal limits. Noncalcified plaque is seen posteriorly at the bifurcation and proximal left ICA. The lumen and is narrowed to 2.5 mm. Additional calcified and noncalcified plaque is seen in  the distal left ICA, again suggesting remote trauma. Vertebral arteries:The vertebral arteries are not visualized. The fracture extends through the foramen transversarium likely reflecting dissection of the vertebral arteries bilaterally. Skeleton: The oblique fracture of the C6 vertebral body and bilateral foramen transversarium is again noted. The fracture and increased lordosis is stable. Diffuse ankylosis of the cervical spine is noted otherwise. Other neck: There is a progressive retropharyngeal and prevertebral hematoma which dissects into the superior mediastinum. The inferior extent of the hematoma is not visualized but extends inferiorly posterior to the trachea to at least the level of T7. There is partial collapse of the left lower lobe. Bilateral pleural effusions are present. There is no pneumothorax. The visualized ribs are intact. Extensive paraspinal edema and hemorrhage in the soft tissues has increased significantly since the earlier study. IMPRESSION: 1. Progressive retropharyngeal and prevertebral hematoma extending from the C6 fracture inferiorly into the mediastinum posterior to the airway and esophagus to at least the level of T7. 2. The hematoma has progressed since the  earlier CT scan. 3. Traumatic type A aortic dissection with abnormal flow into the innominate artery. 4. 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 There is no flow seen in the vertebral arteries or in the basilar artery. 5. Atherosclerotic disease within the carotid bifurcations bilaterally without significant stenosis. 6. Evidence for remote trauma in the high cervical internal carotid arteries bilaterally. 7. Diminished contrast within the subclavian arteries bilaterally, potentially related to decreased perfusion from the arch. 8. Focal narrowing of the proximal right common carotid artery likely due to extrinsic compression. These results were called by telephone at the time of interpretation on  11/29/2015 at 8:06pm to Dr. Juleen ChinaKohut, who verbally acknowledged these results. I also discussed the case with Dr. Carman Chingoug Blackman at 8:06 p.m. and Dr. Jerilee FieldJ.D. Lawson at 8:45 p.m. Electronically Signed   By: Marin Robertshristopher  Mattern M.D.   On: 004/06/2016 20:53   Ct Cervical Spine Wo Contrast  11/29/2015  CLINICAL DATA:  Recent fall with right-sided head and neck pain, initial encounter EXAM: CT HEAD WITHOUT CONTRAST CT CERVICAL SPINE WITHOUT CONTRAST TECHNIQUE: Multidetector CT imaging of the head and cervical spine was performed following the standard protocol without intravenous contrast. Multiplanar CT image reconstructions of the cervical spine were also generated. COMPARISON:  12/28/2015, 12/07/2012 FINDINGS: CT HEAD FINDINGS Bony calvarium is intact. A right frontal and supraorbital scalp hematoma is noted. No underlying fracture is seen. Mild atrophic changes are noted. Chronic white matter ischemic change is seen. No findings to suggest acute hemorrhage, acute infarction or space-occupying mass lesion is identified. CT CERVICAL SPINE FINDINGS Seven cervical segments are well visualized. Large anterior flowing osteophytes are again seen from C2 to C7. At the C6 level there is fracture through the anterior bridging osteophytes as well as the inferior aspect of the C6 vertebral body with evidence of increased lordosis at this level. Fractures through the C6 facets noted bilaterally. This also extends into the lamina at C6 bilaterally. Surrounding soft tissues show soft tissue density at the anterior aspect of C6 consistent with local soft tissue hemorrhage. Multilevel posterior osteophytes are noted particularly at C3-4 and C5-6. These contribute to central canal stenosis at these levels. IMPRESSION: CT of the head: No acute intracranial abnormality. Atrophy and chronic white matter ischemic changes are seen. Right frontal and supraorbital hematoma. CT of the cervical spine: Fracture through the inferior aspect of the C6  vertebral body and as well as large anterior bridging osteophytes anteriorly at that level. Fractures are also noted through the facets and the lamina of C6. Increased lordosis at this level is seen. No other fractures are noted. Critical Value/emergent results were called by telephone at the time of interpretation on 11/29/2015 at 4:23 pm to Dr. Juleen ChinaKohut, who verbally acknowledged these results. Electronically Signed   By: Alcide CleverMark  Lukens M.D.   On: 004/06/2016 16:26   Dg Chest Port 1 View  11/29/2015  CLINICAL DATA:  Endotracheal tube placement, diabetes mellitus, hypertension, hyperlipidemia, history colon cancer EXAM: PORTABLE CHEST 1 VIEW COMPARISON:  Portable exam 1838 hours compared to 12/28/2015 FINDINGS: Tip of endotracheal tube projects approximately 8.1 cm above carina, at the level of clavicular heads. Lordotic positioning. Stable heart size and pulmonary vascularity. Accentuation of superior mediastinum may be related to lordosis. Skin fold projects over LEFT chest. Lungs grossly clear without infiltrate, pleural effusion or pneumothorax. Osseous demineralization with BILATERAL glenohumeral degenerative changes. IMPRESSION: Tip of endotracheal tube projects at clavicular heads approximately 8.1 cm above carina. Clear lungs. Electronically Signed  By: Ulyses Southward M.D.   On: 01-21-2016 18:44     STUDIES:  CTA neck/chest 5/17 > Progressive retropharyngeal and prevertebral hematoma extending from the C6 fracture inferiorly into the mediastinum posterior to the airway and esophagus to at least the level of T7.  The hematoma has progressed since the earlier CT scan.  Traumatic type A aortic dissection with abnormal flow into the innominate artery. 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 There is no flow seen in the vertebral arteries or in the basilar artery. Atherosclerotic disease within the carotid bifurcations bilaterally without significant stenosis. Evidence for remote  trauma in the high cervical internal carotid arteries bilaterally. Diminished contrast within the subclavian arteries bilaterally, potentially related to decreased perfusion from the arch.  Focal narrowing of the proximal right common carotid artery likely due to extrinsic compression. CT head Cspine 5/17 > Fracture through the inferior aspect of the C6 vertebral body and as well as large anterior bridging osteophytes anteriorly at that level. Fractures are also noted through the facets and the lamina of C6.  CULTURES:   ANTIBIOTICS:   SIGNIFICANT EVENTS: 5/17 fall at home, intubated ICU  LINES/TUBES: ETT 5/17 >  DISCUSSION:   ASSESSMENT / PLAN:  PULMONARY A: Inability to protect airway due to retropharyngeal and prevertebral hematoma  P:   Full vent support ABG CXR Vent bundle  CARDIOVASCULAR A:  Hypotension > medication induced vs hemorrhage Transection of bilateral vertebral arteries Possible aortic dissection H/o HTN, HLD   P:  Telemetry monitoring SBP goal 100 - Esmolol gtt as needed for SBP goal Trend troponin Echo Not a surgical candidate per CVTS and vascular who are following If needed could use short course nipride  RENAL A:   No acute issues  P:   Repeat CMP IVF hydration  GASTROINTESTINAL A:   Hx esophageal strictures  P:   NPO tonight Protonix TF in am assuming no surgery  HEMATOLOGIC A:   At risk blood loss anemia  P:  CBC q 6 hours Transfuse for hemoglobin less than 7 SCDs  INFECTIOUS A:   No acute issues  P:   Follow WBC and fever curve  ENDOCRINE A:   DM  P:   CBG monitoring and SSI  NEUROLOGIC A:   C6 fracture No flow to vertebral arteries  P:   RASS goal:-2 Propofol infusion PRN fentanyl Neurosurgery following, not a candidate for surgery.   FAMILY  - Updates: No family present, but hey have been updated by other services. Understand poor prognosis. Wish full code. WIll need further GOC  discussion and potentially palliative involvement.  - Inter-disciplinary family meet or Palliative Care meeting due by:  5/23   Joneen Roach, AGACNP-BC [facility] Pager [phone] or [phone]  12/31/2015 12:05 AM    STAFF NOTE: Cindi Carbon, MD FACP have personally reviewed patient's available data, including medical history, events of note, physical examination and test results as part of my evaluation. I have discussed with resident/NP and other care providers such as pharmacist, RN and RRT. In addition, I personally evaluated patient and elicited key findings of: sedated, neck brace in place, lungs clear, all scans reviewed, fx cervical, Aortic and vertebral injury, no surgical intervention planned, prognosis is poor and we will need comfort care discussions, for now, esmolol for BP control for shear force reduction in setting possible dissection with sys goals immediate 100-110, if needed will add nipride for no more than 24 hrs with renal  fxn noted and following cyanide levels, ABg reviewed, keep same MV on vent , to goal 40% peep 5, limit benzo use with this age group, propofol tolerated thus far but if to remain on vent would switch off, aggressive care, HD, pressors, would be medicaly ineffective and futile, will need pccm to have comfort care talks today, following cbc  The patient is critically ill with multiple organ systems failure and requires high complexity decision making for assessment and support, frequent evaluation and titration of therapies, application of advanced monitoring technologies and extensive interpretation of multiple databases.   Critical Care Time devoted to patient care services described in this note is 35 Minutes. This time reflects time of care of this signee: Rory Percy, MD FACP. This critical care time does not reflect procedure time, or teaching time or supervisory time of PA/NP/Med student/Med Resident etc but  could involve care discussion time. Rest per NP/medical resident whose note is outlined above and that I agree with   Mcarthur Rossetti. Tyson Alias, MD, FACP Pgr: [phone] [facility] 12/31/2015 6:19 AM

## 2016-01-04 NOTE — ED Notes (Signed)
Pt presents via [facility] from home after a fall.  Pt was in the kitchen and fell to on the right side of his body, pt denies LOC although was incontinent during fall.  Pt c/o right shoulder pain and has laceration to right eye, not on blood thinners.  BP-202/111 (pt reports taking medications today.)  Pt arrives in C-collar.  EKG unremarkable.  HR-78 100% RA, CBG-163.  Family at bedside.

## 2016-01-04 NOTE — ED Provider Notes (Signed)
CSN: [id number]     Arrival date & time 01/11/2016  1439 History   First MD Initiated Contact with Patient 01/01/2016 1502     Chief Complaint  Patient presents with  . Fall     (Consider location/radiation/quality/duration/timing/severity/associated sxs/prior Treatment) HPI   82 year old male presenting after fall. He lost his balance in his kitchen at home. He was caring a plate in one hand and a cup in the other as he was making his way to the sink and fell. He struck the right side of his head against a kitchen counter. He did not lose consciousness. He is complaining of pain in the right side of his forehead. He has a laceration just above his right eye. He is also complaining of neck pain. He laid on the floor after the fall and did not try getting up.  Denies any acute numbness, tingling or loss of strength. He was incontinent of urine when he fell. No blood thinners. Unsure of his last tetanus. No acute visual complaints aside from inability to open his right eye. Denies any nausea or vomiting.    Past Medical History  Diagnosis Date  . DIABETES MELLITUS, TYPE II 12/24/2007  . HYPERLIPIDEMIA 03/24/2007  . ANEMIA-NOS 06/02/2007  . HEARING LOSS, LEFT EAR 01/18/2010  . HYPERTENSION 03/24/2007  . PULMONARY NODULE 06/02/2007  . STRICTURE, ESOPHAGEAL 06/02/2007  . GERD 06/02/2007  . GASTROENTERITIS, ACUTE 05/30/2010  . CONSTIPATION 01/18/2010  . CHOLELITHIASIS 06/02/2007  . RENAL INSUFFICIENCY 06/02/2007  . OSTEOARTHRITIS, KNEES, BILATERAL 01/26/2008  . FEVER UNSPECIFIED 01/18/2010  . Abdominal pain, right upper quadrant 06/12/2007  . EPIGASTRIC TENDERNESS 01/18/2010  . COLON CANCER, HX OF 03/24/2007  . NEPHROLITHIASIS, HX OF 06/02/2007   Past Surgical History  Procedure Laterality Date  . Small intestine surgery     Family History  Problem Relation Age of Onset  . Hypertension Other   . Hypertension Father    Social History  Substance Use Topics  . Smoking status: Former Games developer  .  Smokeless tobacco: None  . Alcohol Use: No    Review of Systems  All systems reviewed and negative, other than as noted in HPI.   Allergies  Review of patient's allergies indicates no known allergies.  Home Medications   Prior to Admission medications   Medication Sig Start Date End Date Taking? Authorizing Provider  aspirin EC 81 MG EC tablet Take 1 tablet (81 mg total) by mouth daily. 12/31/15  Yes Barnetta Chapel, MD  carvedilol (COREG) 12.5 MG tablet Take 1 tablet (12.5 mg total) by mouth 2 (two) times daily with a meal. 12/31/15  Yes Barnetta Chapel, MD  cloNIDine (CATAPRES) 0.2 MG tablet Take 1 tablet (0.2 mg total) by mouth 2 (two) times daily. 12/13/15  Yes Corwin Levins, MD  megestrol (MEGACE) 400 MG/10ML suspension Take 10 mLs (400 mg total) by mouth 2 (two) times daily. 12/31/15  Yes Barnetta Chapel, MD  Multiple Vitamin (MULTIVITAMIN WITH MINERALS) TABS tablet Take 1 tablet by mouth daily. 12/31/15  Yes Barnetta Chapel, MD  omeprazole (PRILOSEC) 20 MG capsule Take 1 capsule (20 mg total) by mouth daily. 12/13/15  Yes Corwin Levins, MD  amLODipine (NORVASC) 10 MG tablet Take 1 tablet (10 mg total) by mouth daily. 12/13/15   Corwin Levins, MD  hydrochlorothiazide (HYDRODIURIL) 25 MG tablet Take 25 mg by mouth daily.    Historical Provider, MD  metoprolol (LOPRESSOR) 50 MG tablet Take 50 mg by mouth 2 (  two) times daily.    Historical Provider, MD  traMADol (ULTRAM) 50 MG tablet Take 50 mg by mouth every 12 (twelve) hours as needed for moderate pain.    Historical Provider, MD   BP 181/100 mmHg  Pulse 63  Temp(Src) 97.9 F (36.6 C) (Oral)  Resp 23  SpO2 97% Physical Exam  Constitutional: He is oriented to person, place, and time. He appears well-developed and well-nourished. No distress.  HENT:  Head: Normocephalic and atraumatic.  Stellate laceration through R eyebrow. R lid swelling. Can manually retract lids. PERRL. No hyphema.   Eyes: Conjunctivae are normal.  Pupils are equal, round, and reactive to light. Right eye exhibits no discharge. Left eye exhibits no discharge.  Neck: Neck supple.  Cervical collar  Cardiovascular: Normal rate, regular rhythm and normal heart sounds.  Exam reveals no gallop and no friction rub.   No murmur heard. Pulmonary/Chest: Effort normal and breath sounds normal. No respiratory distress.  Abdominal: Soft. He exhibits no distension. There is no tenderness.  Musculoskeletal: He exhibits no edema or tenderness.  Pain with ROM of R shoulder. No point tenderness. No swelling or deformity. NVI distally.   Neurological: He is alert and oriented to person, place, and time. He exhibits normal muscle tone.  Somewhat hard of hearing, CN 2-12 otherwise intact. Strength 5/5 b/l u/l extremities. Sensation intact to light touch. Had difficulty with finger to nose b/l but suspect because he can only open one eye as opposed to true cerebellar dysfucntion. Did not ambulate.    Skin: Skin is warm and dry.  Psychiatric: He has a normal mood and affect. His behavior is normal. Thought content normal.  Nursing note and vitals reviewed.   ED Course  Procedures (including critical care time)  INTUBATION Performed by: Raeford Razor  Required items: required blood products, implants, devices, and special equipment available Patient identity confirmed: provided demographic data and hospital-assigned identification number Time out: Immediately prior to procedure a "time out" was called to verify the correct patient, procedure, equipment, support staff and site/side marked as required.  Indications: airway protection  Intubation method: Glidescope Laryngoscopy   Preoxygenation: BVM  Sedatives: Etomidate Paralytic: rocuronium  Tube Size: 7.5 cuffed  Post-procedure assessment: chest rise and ETCO2 monitor Breath sounds: equal and absent over the epigastrium Tube secured with: ETT holder Chest x-ray interpreted by radiologist and  me.  Chest x-ray findings: endotracheal tube in appropriate position  Patient tolerated the procedure well with no immediate complications.  CRITICAL CARE Performed by: Raeford Razor Total critical care time: 90 minutes Critical care time was exclusive of separately billable procedures and treating other patients. Critical care was necessary to treat or prevent imminent or life-threatening deterioration. Critical care was time spent personally by me on the following activities: development of treatment plan with patient and/or surrogate as well as nursing, discussions with consultants, evaluation of patient's response to treatment, examination of patient, obtaining history from patient or surrogate, ordering and performing treatments and interventions, ordering and review of laboratory studies, ordering and review of radiographic studies, pulse oximetry and re-evaluation of patient's condition.     Labs Review Labs Reviewed  CBC WITH DIFFERENTIAL/PLATELET - Abnormal; Notable for the following:    RBC 3.10 (*)    Hemoglobin 9.6 (*)    HCT 28.6 (*)    All other components within normal limits  BASIC METABOLIC PANEL - Abnormal; Notable for the following:    Sodium 132 (*)    CO2 21 (*)  Glucose, Bld 140 (*)    BUN 29 (*)    Calcium 8.4 (*)    GFR calc non Af Amer 57 (*)    All other components within normal limits    Imaging Review Dg Shoulder Right  01/18/2016  CLINICAL DATA:  Acute right shoulder pain after fall at home today. Initial encounter. EXAM: RIGHT SHOULDER - 2+ VIEW COMPARISON:  None. FINDINGS: There is no evidence of fracture or dislocation. There is no evidence of arthropathy or other focal bone abnormality. Soft tissues are unremarkable. IMPRESSION: No significant abnormality seen in the right shoulder. Electronically Signed   By: Lupita RaiderJames  Green Jr, M.D.   On: 12/25/2015 16:02   Ct Head Wo Contrast  01/01/2016  CLINICAL DATA:  Recent fall with right-sided head and  neck pain, initial encounter EXAM: CT HEAD WITHOUT CONTRAST CT CERVICAL SPINE WITHOUT CONTRAST TECHNIQUE: Multidetector CT imaging of the head and cervical spine was performed following the standard protocol without intravenous contrast. Multiplanar CT image reconstructions of the cervical spine were also generated. COMPARISON:  12/28/2015, 12/07/2012 FINDINGS: CT HEAD FINDINGS Bony calvarium is intact. A right frontal and supraorbital scalp hematoma is noted. No underlying fracture is seen. Mild atrophic changes are noted. Chronic white matter ischemic change is seen. No findings to suggest acute hemorrhage, acute infarction or space-occupying mass lesion is identified. CT CERVICAL SPINE FINDINGS Seven cervical segments are well visualized. Large anterior flowing osteophytes are again seen from C2 to C7. At the C6 level there is fracture through the anterior bridging osteophytes as well as the inferior aspect of the C6 vertebral body with evidence of increased lordosis at this level. Fractures through the C6 facets noted bilaterally. This also extends into the lamina at C6 bilaterally. Surrounding soft tissues show soft tissue density at the anterior aspect of C6 consistent with local soft tissue hemorrhage. Multilevel posterior osteophytes are noted particularly at C3-4 and C5-6. These contribute to central canal stenosis at these levels. IMPRESSION: CT of the head: No acute intracranial abnormality. Atrophy and chronic white matter ischemic changes are seen. Right frontal and supraorbital hematoma. CT of the cervical spine: Fracture through the inferior aspect of the C6 vertebral body and as well as large anterior bridging osteophytes anteriorly at that level. Fractures are also noted through the facets and the lamina of C6. Increased lordosis at this level is seen. No other fractures are noted. Critical Value/emergent results were called by telephone at the time of interpretation on 01/14/2016 at 4:23 pm to Dr.  Juleen ChinaKohut, who verbally acknowledged these results. Electronically Signed   By: Alcide CleverMark  Lukens M.D.   On: 12/30/2015 16:26   Ct Cervical Spine Wo Contrast  12/25/2015  CLINICAL DATA:  Recent fall with right-sided head and neck pain, initial encounter EXAM: CT HEAD WITHOUT CONTRAST CT CERVICAL SPINE WITHOUT CONTRAST TECHNIQUE: Multidetector CT imaging of the head and cervical spine was performed following the standard protocol without intravenous contrast. Multiplanar CT image reconstructions of the cervical spine were also generated. COMPARISON:  12/28/2015, 12/07/2012 FINDINGS: CT HEAD FINDINGS Bony calvarium is intact. A right frontal and supraorbital scalp hematoma is noted. No underlying fracture is seen. Mild atrophic changes are noted. Chronic white matter ischemic change is seen. No findings to suggest acute hemorrhage, acute infarction or space-occupying mass lesion is identified. CT CERVICAL SPINE FINDINGS Seven cervical segments are well visualized. Large anterior flowing osteophytes are again seen from C2 to C7. At the C6 level there is fracture through the anterior bridging osteophytes  as well as the inferior aspect of the C6 vertebral body with evidence of increased lordosis at this level. Fractures through the C6 facets noted bilaterally. This also extends into the lamina at C6 bilaterally. Surrounding soft tissues show soft tissue density at the anterior aspect of C6 consistent with local soft tissue hemorrhage. Multilevel posterior osteophytes are noted particularly at C3-4 and C5-6. These contribute to central canal stenosis at these levels. IMPRESSION: CT of the head: No acute intracranial abnormality. Atrophy and chronic white matter ischemic changes are seen. Right frontal and supraorbital hematoma. CT of the cervical spine: Fracture through the inferior aspect of the C6 vertebral body and as well as large anterior bridging osteophytes anteriorly at that level. Fractures are also noted through the  facets and the lamina of C6. Increased lordosis at this level is seen. No other fractures are noted. Critical Value/emergent results were called by telephone at the time of interpretation on 13-Jan-2016 at 4:23 pm to Dr. Juleen China, who verbally acknowledged these results. Electronically Signed   By: Alcide Clever M.D.   On: 01-13-2016 16:26   Dg Chest Port 1 View  Jan 13, 2016  CLINICAL DATA:  Endotracheal tube placement, diabetes mellitus, hypertension, hyperlipidemia, history colon cancer EXAM: PORTABLE CHEST 1 VIEW COMPARISON:  Portable exam 1838 hours compared to 12/28/2015 FINDINGS: Tip of endotracheal tube projects approximately 8.1 cm above carina, at the level of clavicular heads. Lordotic positioning. Stable heart size and pulmonary vascularity. Accentuation of superior mediastinum may be related to lordosis. Skin fold projects over LEFT chest. Lungs grossly clear without infiltrate, pleural effusion or pneumothorax. Osseous demineralization with BILATERAL glenohumeral degenerative changes. IMPRESSION: Tip of endotracheal tube projects at clavicular heads approximately 8.1 cm above carina. Clear lungs. Electronically Signed   By: Ulyses Southward M.D.   On: 2016-01-13 18:44   I have personally reviewed and evaluated these images and lab results as part of my medical decision-making.   EKG Interpretation   Date/Time:  Wednesday 01/13/16 14:43:50 EDT Ventricular Rate:  64 PR Interval:  174 QRS Duration: 78 QT Interval:  390 QTC Calculation: 402 R Axis:   5 Text Interpretation:  Sinus rhythm Anteroseptal infarct, age indeterminate  No significant change since last tracing Confirmed by FLOYD MD, DANIEL  [phone]) on Jan 13, 2016 2:54:27 PM      MDM   Final diagnoses:  Closed fracture of cervical vertebra, unspecified cervical vertebral level, initial encounter (HCC)  Compromised airway  Acute respiratory failure, unspecified whether with hypoxia or hypercapnia (HCC)    4:38 PM 89yM s/p fall.  Imaging as above. Nonfocal neuro exam. Remains in collar. Will discuss with neurosurgery. Lac needs repaired.   Discussed with Dr Newell Coral, neurosurgery. Very poor candidate for fixation for both technical and medical reasons. Requesting medical admission with advanced age and underlying medical problems.   6:38 PM Pt acutely decompensated. Complaining he couldn't breath. Noted to have new anterior neck swelling, R>L. Pooling of secretions. Stridor. Increasing difficulty speaking. Intubated for airway protection. Prevertebral swelling noted on CT of c-spine earlier. I suspect he is having extrinsic airway compression with how rapidly his symptoms progressed. Would explain pooling of secretions as well. Neurologic impairment affecting respiratory effort is a consideration but I feel less likely. Will obtain a CTa of neck.   8:08 PM Discussed CTa with radiology. Extensive vascular injury. CTS consulted. Vascular surgery consulted. Trauma admit.   Raeford Razor, MD 01/18/2016 [phone]

## 2016-01-04 NOTE — H&P (Signed)
History   [name] is an 82 y.o. male.   Chief Complaint:  Chief Complaint  Patient presents with  . Fall    Fall  This gentleman presented having initially following his kitchen. He developed a laceration to his for head. Initially, he walked into the result power. After a workup, he was found to have an unstable C6 cervical spine fracture which neurosurgery evaluated. During his time in the emergency department he developed stridor and difficulty breathing and required urgent intubation for airway protection. Secondary to this a CT scan of the head and neck was repeated to evaluate the thoracic and cervical vasculature. Because he required intubation, the trauma services been asked to admit the patient. Currently he is intubated and sedated.  Past Medical History  Diagnosis Date  . DIABETES MELLITUS, TYPE II 12/24/2007  . HYPERLIPIDEMIA 03/24/2007  . ANEMIA-NOS 06/02/2007  . HEARING LOSS, LEFT EAR 01/18/2010  . HYPERTENSION 03/24/2007  . PULMONARY NODULE 06/02/2007  . STRICTURE, ESOPHAGEAL 06/02/2007  . GERD 06/02/2007  . GASTROENTERITIS, ACUTE 05/30/2010  . CONSTIPATION 01/18/2010  . CHOLELITHIASIS 06/02/2007  . RENAL INSUFFICIENCY 06/02/2007  . OSTEOARTHRITIS, KNEES, BILATERAL 01/26/2008  . FEVER UNSPECIFIED 01/18/2010  . Abdominal pain, right upper quadrant 06/12/2007  . EPIGASTRIC TENDERNESS 01/18/2010  . COLON CANCER, HX OF 03/24/2007  . NEPHROLITHIASIS, HX OF 06/02/2007    Past Surgical History  Procedure Laterality Date  . Small intestine surgery      Family History  Problem Relation Age of Onset  . Hypertension Other   . Hypertension Father    Social History:  reports that he has quit smoking. He does not have any smokeless tobacco history on file. He reports that he does not drink alcohol or use illicit drugs.  Allergies  No Known Allergies  Home Medications   (Not in a hospital admission)  Trauma Course   Results for orders placed or performed during the hospital  encounter of 01/03/2016 (from the past 48 hour(s))  CBC with Differential     Status: Abnormal   Collection Time: 01/09/2016  3:23 PM  Result Value Ref Range   WBC 4.7 4.0 - 10.5 K/uL   RBC 3.10 (L) 4.22 - 5.81 MIL/uL   Hemoglobin 9.6 (L) 13.0 - 17.0 g/dL   HCT 28.6 (L) 39.0 - 52.0 %   MCV 92.3 78.0 - 100.0 fL   MCH 31.0 26.0 - 34.0 pg   MCHC 33.6 30.0 - 36.0 g/dL   RDW 12.8 11.5 - 15.5 %   Platelets 195 150 - 400 K/uL   Neutrophils Relative % 74 %   Neutro Abs 3.5 1.7 - 7.7 K/uL   Lymphocytes Relative 15 %   Lymphs Abs 0.7 0.7 - 4.0 K/uL   Monocytes Relative 11 %   Monocytes Absolute 0.5 0.1 - 1.0 K/uL   Eosinophils Relative 0 %   Eosinophils Absolute 0.0 0.0 - 0.7 K/uL   Basophils Relative 0 %   Basophils Absolute 0.0 0.0 - 0.1 K/uL  Basic metabolic panel     Status: Abnormal   Collection Time: 01/12/2016  3:23 PM  Result Value Ref Range   Sodium 132 (L) 135 - 145 mmol/L   Potassium 4.6 3.5 - 5.1 mmol/L   Chloride 103 101 - 111 mmol/L   CO2 21 (L) 22 - 32 mmol/L   Glucose, Bld 140 (H) 65 - 99 mg/dL   BUN 29 (H) 6 - 20 mg/dL   Creatinine, Ser 1.12 0.61 - 1.24 mg/dL  Calcium 8.4 (L) 8.9 - 10.3 mg/dL   GFR calc non Af Amer 57 (L) >60 mL/min   GFR calc Af Amer >60 >60 mL/min    Comment: (NOTE) The eGFR has been calculated using the CKD EPI equation. This calculation has not been validated in all clinical situations. eGFR's persistently <60 mL/min signify possible Chronic Kidney Disease.    Anion gap 8 5 - 15   Dg Shoulder Right  12/24/2015  CLINICAL DATA:  Acute right shoulder pain after fall at home today. Initial encounter. EXAM: RIGHT SHOULDER - 2+ VIEW COMPARISON:  None. FINDINGS: There is no evidence of fracture or dislocation. There is no evidence of arthropathy or other focal bone abnormality. Soft tissues are unremarkable. IMPRESSION: No significant abnormality seen in the right shoulder. Electronically Signed   By: Marijo Conception, M.D.   On: 12/19/2015 16:02   Ct  Head Wo Contrast  01/13/2016  CLINICAL DATA:  Recent fall with right-sided head and neck pain, initial encounter EXAM: CT HEAD WITHOUT CONTRAST CT CERVICAL SPINE WITHOUT CONTRAST TECHNIQUE: Multidetector CT imaging of the head and cervical spine was performed following the standard protocol without intravenous contrast. Multiplanar CT image reconstructions of the cervical spine were also generated. COMPARISON:  12/28/2015, 12/07/2012 FINDINGS: CT HEAD FINDINGS Bony calvarium is intact. A right frontal and supraorbital scalp hematoma is noted. No underlying fracture is seen. Mild atrophic changes are noted. Chronic white matter ischemic change is seen. No findings to suggest acute hemorrhage, acute infarction or space-occupying mass lesion is identified. CT CERVICAL SPINE FINDINGS Seven cervical segments are well visualized. Large anterior flowing osteophytes are again seen from C2 to C7. At the C6 level there is fracture through the anterior bridging osteophytes as well as the inferior aspect of the C6 vertebral body with evidence of increased lordosis at this level. Fractures through the C6 facets noted bilaterally. This also extends into the lamina at C6 bilaterally. Surrounding soft tissues show soft tissue density at the anterior aspect of C6 consistent with local soft tissue hemorrhage. Multilevel posterior osteophytes are noted particularly at C3-4 and C5-6. These contribute to central canal stenosis at these levels. IMPRESSION: CT of the head: No acute intracranial abnormality. Atrophy and chronic white matter ischemic changes are seen. Right frontal and supraorbital hematoma. CT of the cervical spine: Fracture through the inferior aspect of the C6 vertebral body and as well as large anterior bridging osteophytes anteriorly at that level. Fractures are also noted through the facets and the lamina of C6. Increased lordosis at this level is seen. No other fractures are noted. Critical Value/emergent results  were called by telephone at the time of interpretation on 01/04/2016 at 4:23 pm to Dr. Wilson Singer, who verbally acknowledged these results. Electronically Signed   By: Inez Catalina M.D.   On: 12/23/2015 16:26   Ct Cervical Spine Wo Contrast  12/29/2015  CLINICAL DATA:  Recent fall with right-sided head and neck pain, initial encounter EXAM: CT HEAD WITHOUT CONTRAST CT CERVICAL SPINE WITHOUT CONTRAST TECHNIQUE: Multidetector CT imaging of the head and cervical spine was performed following the standard protocol without intravenous contrast. Multiplanar CT image reconstructions of the cervical spine were also generated. COMPARISON:  12/28/2015, 12/07/2012 FINDINGS: CT HEAD FINDINGS Bony calvarium is intact. A right frontal and supraorbital scalp hematoma is noted. No underlying fracture is seen. Mild atrophic changes are noted. Chronic white matter ischemic change is seen. No findings to suggest acute hemorrhage, acute infarction or space-occupying mass lesion is identified. CT  CERVICAL SPINE FINDINGS Seven cervical segments are well visualized. Large anterior flowing osteophytes are again seen from C2 to C7. At the C6 level there is fracture through the anterior bridging osteophytes as well as the inferior aspect of the C6 vertebral body with evidence of increased lordosis at this level. Fractures through the C6 facets noted bilaterally. This also extends into the lamina at C6 bilaterally. Surrounding soft tissues show soft tissue density at the anterior aspect of C6 consistent with local soft tissue hemorrhage. Multilevel posterior osteophytes are noted particularly at C3-4 and C5-6. These contribute to central canal stenosis at these levels. IMPRESSION: CT of the head: No acute intracranial abnormality. Atrophy and chronic white matter ischemic changes are seen. Right frontal and supraorbital hematoma. CT of the cervical spine: Fracture through the inferior aspect of the C6 vertebral body and as well as large anterior  bridging osteophytes anteriorly at that level. Fractures are also noted through the facets and the lamina of C6. Increased lordosis at this level is seen. No other fractures are noted. Critical Value/emergent results were called by telephone at the time of interpretation on 01/16/2016 at 4:23 pm to Dr. Wilson Singer, who verbally acknowledged these results. Electronically Signed   By: Inez Catalina M.D.   On: 12/21/2015 16:26   Dg Chest Port 1 View  12/20/2015  CLINICAL DATA:  Endotracheal tube placement, diabetes mellitus, hypertension, hyperlipidemia, history colon cancer EXAM: PORTABLE CHEST 1 VIEW COMPARISON:  Portable exam 1838 hours compared to 12/28/2015 FINDINGS: Tip of endotracheal tube projects approximately 8.1 cm above carina, at the level of clavicular heads. Lordotic positioning. Stable heart size and pulmonary vascularity. Accentuation of superior mediastinum may be related to lordosis. Skin fold projects over LEFT chest. Lungs grossly clear without infiltrate, pleural effusion or pneumothorax. Osseous demineralization with BILATERAL glenohumeral degenerative changes. IMPRESSION: Tip of endotracheal tube projects at clavicular heads approximately 8.1 cm above carina. Clear lungs. Electronically Signed   By: Lavonia Dana M.D.   On: 01/08/2016 18:44    Review of Systems  Unable to perform ROS: intubated    Blood pressure 135/84, pulse 68, temperature 97.9 F (36.6 C), temperature source Oral, resp. rate 14, SpO2 55 %. Physical Exam  Constitutional: He appears well-developed and well-nourished.  Elderly, thin gentleman intubated and sedated with cervical collar in place  HENT:  Right Ear: External ear normal.  Left Ear: External ear normal.  Nose: Nose normal.  Mouth/Throat: No oropharyngeal exudate.  Laceration to right for head with ecchymosis around right eyelid  Eyes: Right eye exhibits no discharge. Left eye exhibits no discharge. No scleral icterus.  Pupils are pinpoint  Neck:   C-collar in place  Cardiovascular: Normal rate, regular rhythm and normal heart sounds.   No murmur heard. Respiratory: He has no wheezes. He has no rales.  Intubated  GI: Soft. He exhibits no distension.  Musculoskeletal:  No long bone abnormalities  Neurological:  Intubated and sedated  Skin: No erythema.     Assessment/Plan Gentleman status post fall with unstable C-spine fracture and perivertebral swelling that is created airway compromise requiring intubation.  He is being admitted to the trauma service. The follow-up CT of thoracic and cervical vasculature shows an aortic dissection and possible vertebral injuries. The emergency room physician is contacting [facility] for their opinion. Currently, there is no family present in the room. His prognosis is guarded.  Adisa Vigeant A 12/25/2015, 8:11 PM   Procedures

## 2016-01-04 NOTE — H&P (Addendum)
[facility] History and Physical  [name] VHQ:[id number] DOB: [birth year] DOA: 01/20/2016   PCP: Oliver Barre, MD  Specialists: None  Chief Complaint: Fall with pain in the neck  HPI: [name] is a 82 y.o. male with a past medical history of hypertension who was recently hospitalized a few days ago for accelerated hypertension, and chest pain. Patient had ran out of medications at home. Chest pain was thought to be due to elevated blood pressure. Patient also was hydrated. He was discharged on May 13. He was in his usual state of health till about this afternoon when he was in his kitchen. He was walking and then he lost balance and he fell towards his right side onto his right shoulder. He had a headache. He denies any passing out episodes. No nausea, vomiting, no fever, chills recently. No diarrhea recently. Currently, he has a pain in the neck area and also some in his shoulder. He denies any tingling or numbness in his fingers or toes. Able to move all his extremities.  In the emergency department he was found to have fracture of the C6 vertebral body. Neurosurgery was consulted and the patient was admitted for further management.  Home Medications: Prior to Admission medications   Medication Sig Start Date End Date Taking? Authorizing Provider  aspirin EC 81 MG EC tablet Take 1 tablet (81 mg total) by mouth daily. 12/31/15  Yes Barnetta Chapel, MD  carvedilol (COREG) 12.5 MG tablet Take 1 tablet (12.5 mg total) by mouth 2 (two) times daily with a meal. 12/31/15  Yes Barnetta Chapel, MD  cloNIDine (CATAPRES) 0.2 MG tablet Take 1 tablet (0.2 mg total) by mouth 2 (two) times daily. 12/13/15  Yes Corwin Levins, MD  megestrol (MEGACE) 400 MG/10ML suspension Take 10 mLs (400 mg total) by mouth 2 (two) times daily. 12/31/15  Yes Barnetta Chapel, MD  Multiple Vitamin (MULTIVITAMIN WITH MINERALS) TABS tablet Take 1 tablet by mouth daily. 12/31/15  Yes Barnetta Chapel, MD   omeprazole (PRILOSEC) 20 MG capsule Take 1 capsule (20 mg total) by mouth daily. 12/13/15  Yes Corwin Levins, MD  amLODipine (NORVASC) 10 MG tablet Take 1 tablet (10 mg total) by mouth daily. 12/13/15   Corwin Levins, MD  hydrochlorothiazide (HYDRODIURIL) 25 MG tablet Take 25 mg by mouth daily.    Historical Provider, MD  metoprolol (LOPRESSOR) 50 MG tablet Take 50 mg by mouth 2 (two) times daily.    Historical Provider, MD  traMADol (ULTRAM) 50 MG tablet Take 50 mg by mouth every 12 (twelve) hours as needed for moderate pain.    Historical Provider, MD    Allergies: No Known Allergies  Past Medical History: Past Medical History  Diagnosis Date  . DIABETES MELLITUS, TYPE II 12/24/2007  . HYPERLIPIDEMIA 03/24/2007  . ANEMIA-NOS 06/02/2007  . HEARING LOSS, LEFT EAR 01/18/2010  . HYPERTENSION 03/24/2007  . PULMONARY NODULE 06/02/2007  . STRICTURE, ESOPHAGEAL 06/02/2007  . GERD 06/02/2007  . GASTROENTERITIS, ACUTE 05/30/2010  . CONSTIPATION 01/18/2010  . CHOLELITHIASIS 06/02/2007  . RENAL INSUFFICIENCY 06/02/2007  . OSTEOARTHRITIS, KNEES, BILATERAL 01/26/2008  . FEVER UNSPECIFIED 01/18/2010  . Abdominal pain, right upper quadrant 06/12/2007  . EPIGASTRIC TENDERNESS 01/18/2010  . COLON CANCER, HX OF 03/24/2007  . NEPHROLITHIASIS, HX OF 06/02/2007    Past Surgical History  Procedure Laterality Date  . Small intestine surgery      Social History: Lives in [place] with his wife. Denies smoking, alcohol use  or illicit drug use. Uses a cane to ambulate.  Family History:  Family History  Problem Relation Age of Onset  . Hypertension Other   . Hypertension Father      Review of Systems - History obtained from the patient General ROS: negative Psychological ROS: negative Ophthalmic ROS: swollen right eye ENT ROS: negative Allergy and Immunology ROS: negative Hematological and Lymphatic ROS: negative Endocrine ROS: negative Respiratory ROS: no cough, shortness of breath, or  wheezing Cardiovascular ROS: no chest pain or dyspnea on exertion Gastrointestinal ROS: no abdominal pain, change in bowel habits, or black or bloody stools Genito-Urinary ROS: no dysuria, trouble voiding, or hematuria Musculoskeletal ROS: negative Neurological ROS: no TIA or stroke symptoms Dermatological ROS: negative  Physical Examination  Filed Vitals:   12/19/2015 1448 12/31/2015 1707  BP: 181/100 156/86  Pulse: 63 67  Temp: 97.9 F (36.6 C)   TempSrc: Oral   Resp: 23 16  SpO2: 97% 98%    BP 156/86 mmHg  Pulse 67  Temp(Src) 97.9 F (36.6 C) (Oral)  Resp 16  SpO2 98%  General appearance: alert, cooperative, appears stated age and no distress Head: swollen right eye which is completely shut. Laceration right fore head. Eyes: able to lift the right upper eyelid. Patient was able to see my hand.  Able to move his eye without any difficulty Throat: lips, mucosa, and tongue normal; teeth and gums normal Neck: neck was in a cervical collar, which was not removed Resp: clear to auscultation bilaterally Cardio: regular rate and rhythm, S1, S2 normal, no murmur, click, rub or gallop GI: soft, non-tender; bowel sounds normal; no masses,  no organomegaly Extremities: Larger left leg compared to the right. No erythema. No calf tenderness. Pulses: 2+ and symmetric Skin: Skin color, texture, turgor normal. No rashes or lesions Lymph nodes: Cervical, supraclavicular, and axillary nodes normal. Neurologic: Awake and alert. Oriented 3. No focal neurological deficits.   Labs on Admission: I have personally reviewed following labs and imaging studies  CBC:  Recent Labs Lab 12/28/15 2124 12/29/15 0426 12/30/2015 1523  WBC 7.1 5.6 4.7  NEUTROABS  --   --  3.5  HGB 11.6* 9.6* 9.6*  HCT 36.1* 30.0* 28.6*  MCV 97.0 94.6 92.3  PLT 257 179 195   Basic Metabolic Panel:  Recent Labs Lab 12/29/15 0426  12/29/15 2017 12/30/15 0145 12/30/15 1100 12/31/15 0603 01/12/2016 1523  NA  143  --  139  --  138 139 132*  K 2.7*  < > 4.0 4.0 4.1 4.4 4.6  CL 103  --  102  --  106 105 103  CO2 27  --  27  --  24 25 21*  GLUCOSE 112*  --  105*  --  113* 114* 140*  BUN 21*  --  20  --  20 21* 29*  CREATININE 1.64*  --  1.50*  --  1.53* 1.47* 1.12  CALCIUM 6.0*  --  6.6*  --  6.2* 7.1* 8.4*  MG 0.4*  --   --  1.4*  --  1.7  --   PHOS 3.2  --   --   --  2.9 2.6  --   < > = values in this interval not displayed.  GFR: Estimated Creatinine Clearance: 37.8 mL/min (by C-G formula based on Cr of 1.12).  Liver Function Tests:  Recent Labs Lab 12/29/15 0426 12/30/15 1100 12/31/15 0603  AST 27 23 22   ALT 11* 8* 12*  ALKPHOS 40  37* 40  BILITOT 0.9 1.0 0.6  PROT 6.0* 4.9* 5.4*  ALBUMIN 2.9* 2.5* 2.4*   Cardiac Enzymes:  Recent Labs Lab 12/29/15 0908 12/29/15 1444 12/29/15 2025  TROPONINI 0.04* 0.05* 0.03   Urine analysis:    Component Value Date/Time   COLORURINE YELLOW 12/29/2015 0717   APPEARANCEUR CLEAR 12/29/2015 0717   LABSPEC 1.013 12/29/2015 0717   PHURINE 6.5 12/29/2015 0717   GLUCOSEU 100* 12/29/2015 0717   GLUCOSEU 250* 09/30/2015 1711   HGBUR NEGATIVE 12/29/2015 0717   BILIRUBINUR NEGATIVE 12/29/2015 0717   KETONESUR NEGATIVE 12/29/2015 0717   PROTEINUR NEGATIVE 12/29/2015 0717   UROBILINOGEN 1.0 09/30/2015 1711   NITRITE NEGATIVE 12/29/2015 0717   LEUKOCYTESUR NEGATIVE 12/29/2015 0717     Radiological Exams on Admission: Dg Shoulder Right  2016/01/29  CLINICAL DATA:  Acute right shoulder pain after fall at home today. Initial encounter. EXAM: RIGHT SHOULDER - 2+ VIEW COMPARISON:  None. FINDINGS: There is no evidence of fracture or dislocation. There is no evidence of arthropathy or other focal bone abnormality. Soft tissues are unremarkable. IMPRESSION: No significant abnormality seen in the right shoulder. Electronically Signed   By: Lupita Raider, M.D.   On: 2016-01-29 16:02   Ct Head Wo Contrast  01/29/2016  CLINICAL DATA:  Recent fall with  right-sided head and neck pain, initial encounter EXAM: CT HEAD WITHOUT CONTRAST CT CERVICAL SPINE WITHOUT CONTRAST TECHNIQUE: Multidetector CT imaging of the head and cervical spine was performed following the standard protocol without intravenous contrast. Multiplanar CT image reconstructions of the cervical spine were also generated. COMPARISON:  12/28/2015, 12/07/2012 FINDINGS: CT HEAD FINDINGS Bony calvarium is intact. A right frontal and supraorbital scalp hematoma is noted. No underlying fracture is seen. Mild atrophic changes are noted. Chronic white matter ischemic change is seen. No findings to suggest acute hemorrhage, acute infarction or space-occupying mass lesion is identified. CT CERVICAL SPINE FINDINGS Seven cervical segments are well visualized. Large anterior flowing osteophytes are again seen from C2 to C7. At the C6 level there is fracture through the anterior bridging osteophytes as well as the inferior aspect of the C6 vertebral body with evidence of increased lordosis at this level. Fractures through the C6 facets noted bilaterally. This also extends into the lamina at C6 bilaterally. Surrounding soft tissues show soft tissue density at the anterior aspect of C6 consistent with local soft tissue hemorrhage. Multilevel posterior osteophytes are noted particularly at C3-4 and C5-6. These contribute to central canal stenosis at these levels. IMPRESSION: CT of the head: No acute intracranial abnormality. Atrophy and chronic white matter ischemic changes are seen. Right frontal and supraorbital hematoma. CT of the cervical spine: Fracture through the inferior aspect of the C6 vertebral body and as well as large anterior bridging osteophytes anteriorly at that level. Fractures are also noted through the facets and the lamina of C6. Increased lordosis at this level is seen. No other fractures are noted. Critical Value/emergent results were called by telephone at the time of interpretation on  01/29/2016 at 4:23 pm to Dr. Juleen China, who verbally acknowledged these results. Electronically Signed   By: Alcide Clever M.D.   On: Jan 29, 2016 16:26   Ct Cervical Spine Wo Contrast  29-Jan-2016  CLINICAL DATA:  Recent fall with right-sided head and neck pain, initial encounter EXAM: CT HEAD WITHOUT CONTRAST CT CERVICAL SPINE WITHOUT CONTRAST TECHNIQUE: Multidetector CT imaging of the head and cervical spine was performed following the standard protocol without intravenous contrast. Multiplanar CT image reconstructions  of the cervical spine were also generated. COMPARISON:  12/28/2015, 12/07/2012 FINDINGS: CT HEAD FINDINGS Bony calvarium is intact. A right frontal and supraorbital scalp hematoma is noted. No underlying fracture is seen. Mild atrophic changes are noted. Chronic white matter ischemic change is seen. No findings to suggest acute hemorrhage, acute infarction or space-occupying mass lesion is identified. CT CERVICAL SPINE FINDINGS Seven cervical segments are well visualized. Large anterior flowing osteophytes are again seen from C2 to C7. At the C6 level there is fracture through the anterior bridging osteophytes as well as the inferior aspect of the C6 vertebral body with evidence of increased lordosis at this level. Fractures through the C6 facets noted bilaterally. This also extends into the lamina at C6 bilaterally. Surrounding soft tissues show soft tissue density at the anterior aspect of C6 consistent with local soft tissue hemorrhage. Multilevel posterior osteophytes are noted particularly at C3-4 and C5-6. These contribute to central canal stenosis at these levels. IMPRESSION: CT of the head: No acute intracranial abnormality. Atrophy and chronic white matter ischemic changes are seen. Right frontal and supraorbital hematoma. CT of the cervical spine: Fracture through the inferior aspect of the C6 vertebral body and as well as large anterior bridging osteophytes anteriorly at that level. Fractures  are also noted through the facets and the lamina of C6. Increased lordosis at this level is seen. No other fractures are noted. Critical Value/emergent results were called by telephone at the time of interpretation on 01/14/2016 at 4:23 pm to Dr. Juleen China, who verbally acknowledged these results. Electronically Signed   By: Alcide Clever M.D.   On: 12/22/2015 16:26    My interpretation of Electrocardiogram: Sinus rhythm in the 60s. Normal axis. Intervals are normal. No concerning Q waves. Nonspecific T wave changes. No concerning ST segment changes.   Problem List  Principal Problem:   Fracture of cervical vertebra, C6 (HCC) Active Problems:   ANEMIA-NOS   Essential hypertension   CKD (chronic kidney disease)   Assessment: This is a 82 year old African-American male who presents after mechanical fall which resulted in laceration to his right forehead, swollen right eye, and a C6 vertebral body fracture.  Plan: #1 Fracture of the C6 vertebral body: Neurosurgery has been consulted. Discussed with Dr. Newell Coral. He will discuss further with the patient and the family. Due to patient's old age and other technical aspects of the fracture, surgery may be challenging. He would like to bring the patient into the hospital and repeat x-rays tomorrow morning. Further management decisions to be based on patient's clinical status. Patient appears to be neurologically intact at this time. Pain management. Further management per neurosurgery.  #2 history of essential hypertension with recent accelerated hypertension: Blood pressure was very high when he initially came in to the ED, most likely due to pain. Blood pressure has improved since. Continue with his home medications. During his recent hospitalization, his hydrochlorothiazide and metoprolol were discontinued. Continue with his other home medications including clonidine, amlodipine, and carvedilol. He did have an echocardiogram on May 11, which showed normal  systolic function without any wall motion abnormalities.  #3 Normocytic anemia: Hemoglobin is stable. No overt bleeding. Continue to monitor. Likely due to chronic disease.  #4 chronic kidney disease. The renal function appears to be close to baseline. Monitor urine output.  #5 Laceration right forehead with swollen right eye:  No orbital injury noted on CT scan. ED physician to suture the laceration. He has been given tetanus toxoid Vaccine in the ED.  Neurochecks.  #6 slightly swollen left leg: Patient's daughter was surprised by this finding. Patient tells me that he has had this left leg swelling on and off for many months. Since he was recently hospitalized, we will get a venous Doppler study.  ADDENDUM Called by Dr. Juleen ChinaKohut that shortly after I saw the patient, he started complaining of difficulty breathing. He rapidly decompensated and had to be intubated. It's possible that the hematoma might be pushing against the trachea. Dr. Juleen ChinaKohut will assume care now and call PCCM. Dr. Newell CoralNudelman is aware of this change in status.  DVT Prophylaxis: TED stockings for now till Venous Doppler study is completed. Use SCD's if no DVT. Per Neurosurgery: Please do not use any heparin products due to prevertebral hematoma noted on CT scan. Code Status: Full code Family Communication: Discussed with the patient and his daughter  Disposition Plan: Admit to MedSurg  Consults called: Neurosurgery  Admission status: Inpatient. Anticipate discharge in 2-3 days if no surgery.   Further management decisions will depend on results of further testing and patient's response to treatment.   [facility] LLCKRISHNAN,Haidan Nhan  [facility] Pager [phone]  If 7PM-7AM, please contact night-coverage [email or web address] Password [email or web address]  01/18/2016, 6:14 PM

## 2016-01-04 NOTE — ED Notes (Signed)
MD at bedside, patient in respiratory distress. O2 nonrebreather placed, no change. Dr. Juleen ChinaKohut intubated at beside. Respiratory was present. Cassie RN pushed meds. Hayley RN started 2nd IV.

## 2016-01-04 NOTE — ED Notes (Signed)
Switched Ccollar to Sara Leespen collar per MD orders. Cspine was stabilized during placement.

## 2016-01-04 NOTE — Progress Notes (Signed)
   February 27, 2016 2000  Vent Select  Invasive or Noninvasive Invasive  Adult Vent Y  Airway 7.5 mm  Placement Date/Time: February 27, 2016 1828   Placed By: ED Physician  Airway Device: Endotracheal Tube  Laryngoscope Blade: MAC;4  ETT Types: Oral  Size (mm): 7.5 mm  Cuffed: Cuffed  Insertion attempts: 1  Airway Equipment: Video Laryngoscope;Lighted Stylet  ...  Secured at (cm) 24 cm  Measured From Lips  Secured Location Right  Secured By Publishing rights managerCommercial Tube Holder  Site Condition Dry  Adult Ventilator Settings  Vent Type Servo i  Humidity HME  Vent Mode PRVC  Vt Set 500 mL  Set Rate 14 bmp  FiO2 (%) 40 % (Fio2 decreased post ABG results)  I Time 0.8 Sec(s)  PEEP 5 cmH20

## 2016-01-04 NOTE — Progress Notes (Signed)
eLink Physician-Brief Progress Note Patient Name: [name] DOB: 08/24/1924 MRN: [id number]   Date of Service  May 09, 2016  HPI/Events of Note  82 yo AAm s/p fall with extensive neck and chest trauma bilateral vertebral artery disruptions at the level of the cervical spine fracture, extensive hematoma in the mediastinum retroesophageal which likely led to the intubation, possible compression of the right common carotid the level of the sternum. What is less clear is the question of dissection of the arch aorta, may be streaming effect.   eICU Interventions  Vent management, pain/sedation orders Follow up labs in AM, follow up Trauma recs and BP recs from CT surgery     Intervention Category Major Interventions: Respiratory failure - evaluation and management  [name] May 09, 2016, 11:17 PM

## 2016-01-04 NOTE — ED Notes (Signed)
Transported patient to CT with respiratory. BP dropped during scan, decreased Propofol to 10 mcg.

## 2016-01-04 NOTE — ED Provider Notes (Signed)
..  Laceration Repair Date/Time: 12-07-2015 9:45 PM Performed by: Arthor CaptainHARRIS, Anallely Rosell Authorized by: Arthor CaptainHARRIS, Lakoda Mcanany Consent: Verbal consent obtained. Consent given by: spouse Patient identity confirmed: provided demographic data Body area: head/neck Location details: right eyelid Laceration length: 6 cm Foreign bodies: no foreign bodies Tendon involvement: none Nerve involvement: none Vascular damage: no Anesthesia: local infiltration Local anesthetic: lidocaine 2% with epinephrine Anesthetic total: 6 ml Patient sedated: yes (see intubation note) Irrigation solution: saline Amount of cleaning: standard Skin closure: 5-0 Prolene Number of sutures: 7 Technique: simple Approximation difficulty: complex Patient tolerance: Patient tolerated the procedure well with no immediate complications     Arthor Captainbigail Amal Saiki, PA-C 2016/01/28 2148  Raeford RazorStephen Kohut, MD 01/29/16 [phone]

## 2016-01-04 NOTE — ED Notes (Signed)
Patient called out with complaints of SOB.

## 2016-01-04 NOTE — Progress Notes (Signed)
Chaplain responded to page from Geophysical data processorN secretary to assist family in hallway outside patient room.  Daughter and son-in-law present...were in patient room when he stopped breathing. MD updated them on increased seriousness of patient prognosis.  Patient will be transferred to ICU when bed is available.  Chaplain escorted family to consultation room.  Will notify RN where family is.  Rev. Audie BoxJan Hill, chaplain [phone]

## 2016-01-05 ENCOUNTER — Inpatient Hospital Stay (HOSPITAL_COMMUNITY): Payer: Commercial Managed Care - HMO

## 2016-01-05 DIAGNOSIS — J9600 Acute respiratory failure, unspecified whether with hypoxia or hypercapnia: Secondary | ICD-10-CM

## 2016-01-05 DIAGNOSIS — I10 Essential (primary) hypertension: Secondary | ICD-10-CM

## 2016-01-05 DIAGNOSIS — N183 Chronic kidney disease, stage 3 (moderate): Secondary | ICD-10-CM

## 2016-01-05 DIAGNOSIS — S129XXA Fracture of neck, unspecified, initial encounter: Secondary | ICD-10-CM

## 2016-01-05 DIAGNOSIS — S12500A Unspecified displaced fracture of sixth cervical vertebra, initial encounter for closed fracture: Principal | ICD-10-CM

## 2016-01-05 DIAGNOSIS — J988 Other specified respiratory disorders: Secondary | ICD-10-CM

## 2016-01-05 LAB — COMPREHENSIVE METABOLIC PANEL
ALBUMIN: 2.4 g/dL — AB (ref 3.5–5.0)
ALBUMIN: 2.4 g/dL — AB (ref 3.5–5.0)
ALK PHOS: 36 U/L — AB (ref 38–126)
ALT: 12 U/L — AB (ref 17–63)
ALT: 13 U/L — AB (ref 17–63)
ANION GAP: 7 (ref 5–15)
AST: 24 U/L (ref 15–41)
AST: 26 U/L (ref 15–41)
Alkaline Phosphatase: 35 U/L — ABNORMAL LOW (ref 38–126)
Anion gap: 10 (ref 5–15)
BILIRUBIN TOTAL: 1.1 mg/dL (ref 0.3–1.2)
BILIRUBIN TOTAL: 1.2 mg/dL (ref 0.3–1.2)
BUN: 26 mg/dL — AB (ref 6–20)
BUN: 28 mg/dL — ABNORMAL HIGH (ref 6–20)
CALCIUM: 7.7 mg/dL — AB (ref 8.9–10.3)
CHLORIDE: 102 mmol/L (ref 101–111)
CO2: 21 mmol/L — ABNORMAL LOW (ref 22–32)
CO2: 22 mmol/L (ref 22–32)
CREATININE: 1.74 mg/dL — AB (ref 0.61–1.24)
CREATININE: 1.78 mg/dL — AB (ref 0.61–1.24)
Calcium: 7.7 mg/dL — ABNORMAL LOW (ref 8.9–10.3)
Chloride: 103 mmol/L (ref 101–111)
GFR calc Af Amer: 38 mL/min — ABNORMAL LOW (ref >60)
GFR calc non Af Amer: 33 mL/min — ABNORMAL LOW (ref >60)
GFR, EST AFRICAN AMERICAN: 39 mL/min — AB (ref >60)
GFR, EST NON AFRICAN AMERICAN: 32 mL/min — AB (ref >60)
GLUCOSE: 126 mg/dL — AB (ref 65–99)
GLUCOSE: 145 mg/dL — AB (ref 65–99)
Potassium: 4.2 mmol/L (ref 3.5–5.1)
Potassium: 4.3 mmol/L (ref 3.5–5.1)
Sodium: 132 mmol/L — ABNORMAL LOW (ref 135–145)
Sodium: 133 mmol/L — ABNORMAL LOW (ref 135–145)
TOTAL PROTEIN: 4.9 g/dL — AB (ref 6.5–8.1)
TOTAL PROTEIN: 5 g/dL — AB (ref 6.5–8.1)

## 2016-01-05 LAB — TRIGLYCERIDES: Triglycerides: 35 mg/dL (ref <150)

## 2016-01-05 LAB — CBC
HEMATOCRIT: 24.8 % — AB (ref 39.0–52.0)
HEMATOCRIT: 25.8 % — AB (ref 39.0–52.0)
HEMOGLOBIN: 8.6 g/dL — AB (ref 13.0–17.0)
Hemoglobin: 8.7 g/dL — ABNORMAL LOW (ref 13.0–17.0)
MCH: 31 pg (ref 26.0–34.0)
MCH: 32 pg (ref 26.0–34.0)
MCHC: 33.7 g/dL (ref 30.0–36.0)
MCHC: 34.7 g/dL (ref 30.0–36.0)
MCV: 91.8 fL (ref 78.0–100.0)
MCV: 92.2 fL (ref 78.0–100.0)
Platelets: 161 10*3/uL (ref 150–400)
Platelets: 169 10*3/uL (ref 150–400)
RBC: 2.69 MIL/uL — AB (ref 4.22–5.81)
RBC: 2.81 MIL/uL — AB (ref 4.22–5.81)
RDW: 12.7 % (ref 11.5–15.5)
RDW: 12.9 % (ref 11.5–15.5)
WBC: 8.2 10*3/uL (ref 4.0–10.5)
WBC: 9 10*3/uL (ref 4.0–10.5)

## 2016-01-05 LAB — POCT I-STAT 7, (LYTES, BLD GAS, ICA,H+H)
ACID-BASE DEFICIT: 6 mmol/L — AB (ref 0.0–2.0)
Bicarbonate: 18.8 meq/L — ABNORMAL LOW (ref 20.0–24.0)
CALCIUM ION: 1.1 mmol/L — AB (ref 1.13–1.30)
HCT: 26 % — ABNORMAL LOW (ref 39.0–52.0)
HEMOGLOBIN: 8.8 g/dL — AB (ref 13.0–17.0)
O2 SAT: 90 %
PCO2 ART: 31.7 mmHg — AB (ref 35.0–45.0)
PH ART: 7.38 (ref 7.350–7.450)
POTASSIUM: 4.3 mmol/L (ref 3.5–5.1)
SODIUM: 135 mmol/L (ref 135–145)
TCO2: 20 mmol/L (ref 0–100)
pO2, Arterial: 60 mmHg — ABNORMAL LOW (ref 80.0–100.0)

## 2016-01-05 LAB — MRSA PCR SCREENING: MRSA BY PCR: NEGATIVE

## 2016-01-05 LAB — TROPONIN I
Troponin I: <0.03 ng/mL (ref <0.031)
Troponin I: <0.03 ng/mL (ref <0.031)

## 2016-01-05 LAB — MAGNESIUM: MAGNESIUM: 1.1 mg/dL — AB (ref 1.7–2.4)

## 2016-01-05 LAB — PHOSPHORUS: Phosphorus: 4.3 mg/dL (ref 2.5–4.6)

## 2016-01-05 LAB — GLUCOSE, CAPILLARY: Glucose-Capillary: 131 mg/dL — ABNORMAL HIGH (ref 65–99)

## 2016-01-05 MED ORDER — SODIUM CHLORIDE 0.9 % IV BOLUS (SEPSIS): 1000.0000 mL | Freq: Once | INTRAVENOUS | Status: DC

## 2016-01-05 MED ORDER — ESMOLOL HCL-SODIUM CHLORIDE 2000 MG/100ML IV SOLN
25.0000 ug/kg/min | INTRAVENOUS | Status: DC
  Administered 2016-01-05: 25 ug/kg/min via INTRAVENOUS
  Filled 2016-01-05: qty 100

## 2016-01-08 NOTE — Progress Notes (Signed)
Chaplain escorted family to [facility] waiting area and notified staff in [place] of their presence.  Rev. RuthtonJan Hill, IowaChaplain [phone]

## 2016-01-19 NOTE — Progress Notes (Addendum)
PCCM Interval Progress Note  Chart reviewed.  Pt admitted overnight following traumatic fall where he struck the side of his head.  Sustained C6 fx which was considered non-operative per neurosurgery.  Also had transection type injury to ascending aortic arch along with no flow to bilateral verterbral arteries (likey due to disruption from C6 fx injury).  While in ED, developed stridor requiring intubation. Vascular surgery and CVTS are involved and consider his injuries non-operative at this point.  Pt has been intermittently awake and following commands this AM.  He has been on esmolol gtt overnight which has had to be stopped this AM due to hypotension (SBP in 60's).  He is currently receiving 1L bolus and SBP up to 80's.  Given the extent of his injuries which are non-operative, along with his age and co-morbidities, I had extensive discussions with pt's wife, son, and daughter this morning. We discussed Mr. Prats's current circumstances and injuries. We also discussed patient's prior wishes under circumstances such as this. The family has decided not to perform resuscitation if arrest were to occur, but to otherwise continue with current medical support / therapies.  All family questions answered.  Orders changed to full DNR but will continue to perform aggressive supportive care as able.   Rutherford Guysahul Desai, [place] - [facility] Pager: [phone]  or [phone] 01/04/2016, 11:20 AM   Addendum : discussions have been made with family. Pt now  DNR status. Poor prognosis. No surgery contemplated.   Pollie MeyerJ. Angelo A de Dios, MD 01/06/2016, 11:37 AM [facility] Pager [phone] After 3 pm or if no answer, call [phone]

## 2016-01-19 NOTE — Discharge Summary (Signed)
PULMONARY / CRITICAL CARE MEDICINE   Name: [name] MRN: [id number] DOB: [birth year]    ADMISSION DATE:  12/25/2015 CONSULTATION DATE:  01/06/2016  REFERRING MD:  CCS  CHIEF COMPLAINT:  Fall  HISTORY OF PRESENT ILLNESS:   82 year old male with past medical history as below, which includes diabetes, hypertension, esophageal stricture, renal insufficiency, and colon cancer. He was at home in his usual state of health 5/17 when he suffered a fall during which he struck the side of his head against the counter. He did not lose consciousness. Upon presentation to the emergency department is complaining of right-sided head pain where he had a laceration just above his right eye. Initially his mental status was intact until he developed respiratory stridor and shortness of breath requiring intubation. He was found to have suffered a C6 fracture which was considered nonoperative on neurosurgical evaluation. Due to the development of stridor CT angiogram of the neck and chest were ordered which demonstrated transection type injury of the ascending aortic arch and no flow in bilateral vertebral arteries likely due to transection or disruption at the level of C6 by fracture. Vascular surgery also consider him to be nonoperative candidate. He was evaluated by cardiovascular thoracic surgery as well for the aortic arch injury, however, in their perspective he would not be considered for surgical intervention. PCCM has been asked as to assist with ICU care.  PAST MEDICAL HISTORY :  He  has a past medical history of DIABETES MELLITUS, TYPE II (12/24/2007); HYPERLIPIDEMIA (03/24/2007); ANEMIA-NOS (06/02/2007); HEARING LOSS, LEFT EAR (01/18/2010); HYPERTENSION (03/24/2007); PULMONARY NODULE (06/02/2007); STRICTURE, ESOPHAGEAL (06/02/2007); GERD (06/02/2007); GASTROENTERITIS, ACUTE (05/30/2010); CONSTIPATION (01/18/2010); CHOLELITHIASIS (06/02/2007); RENAL INSUFFICIENCY (06/02/2007); OSTEOARTHRITIS, KNEES, BILATERAL (01/26/2008);  FEVER UNSPECIFIED (01/18/2010); Abdominal pain, right upper quadrant (06/12/2007); EPIGASTRIC TENDERNESS (01/18/2010); COLON CANCER, HX OF (03/24/2007); and NEPHROLITHIASIS, HX OF (06/02/2007).  PAST SURGICAL HISTORY: He  has past surgical history that includes Small intestine surgery.  No Known Allergies  No current facility-administered medications on file prior to encounter.   Current Outpatient Prescriptions on File Prior to Encounter  Medication Sig  . aspirin EC 81 MG EC tablet Take 1 tablet (81 mg total) by mouth daily.  . carvedilol (COREG) 12.5 MG tablet Take 1 tablet (12.5 mg total) by mouth 2 (two) times daily with a meal.  . cloNIDine (CATAPRES) 0.2 MG tablet Take 1 tablet (0.2 mg total) by mouth 2 (two) times daily.  . megestrol (MEGACE) 400 MG/10ML suspension Take 10 mLs (400 mg total) by mouth 2 (two) times daily.  . Multiple Vitamin (MULTIVITAMIN WITH MINERALS) TABS tablet Take 1 tablet by mouth daily.  Marland Kitchen omeprazole (PRILOSEC) 20 MG capsule Take 1 capsule (20 mg total) by mouth daily.  Marland Kitchen amLODipine (NORVASC) 10 MG tablet Take 1 tablet (10 mg total) by mouth daily.    FAMILY HISTORY:  His indicated that his mother is deceased. He indicated that his father is deceased.   SOCIAL HISTORY: He  reports that he has quit smoking. He does not have any smokeless tobacco history on file. He reports that he does not drink alcohol or use illicit drugs.  REVIEW OF SYSTEMS:  Unable, vented, sedated   SUBJECTIVE:  No pressors   VITAL SIGNS: BP 78/57 mmHg  Pulse 31  Temp(Src) 97.5 F (36.4 C) (Axillary)  Resp 15  SpO2 67%  HEMODYNAMICS:    VENTILATOR SETTINGS:    INTAKE / OUTPUT:    PHYSICAL EXAMINATION: General:  Elderly male on vent Neuro:  Sedated but arouses  spontaneously, follows commands HEENT:  Close Lac above R eye, periorbital edema, PERRL. C-collar in place Cardiovascular:  RRR, no MRG. Muffled heart sounds Lungs:  Clear bilateral breath sounds Abdomen:   Soft, non-distended Musculoskeletal:  No acute deformity, +1 BLE edema Skin:  R eye lac  LABS:  BMET  Recent Labs Lab Jul 18, 2016 1523 12/29/2015 0002 01/03/2016 0503 01/03/2016 0812  NA 132* 132* 133* 135  K 4.6 4.3 4.2 4.3  CL 103 103 102  --   CO2 21* 22 21*  --   BUN 29* 28* 26*  --   CREATININE 1.12 1.74* 1.78*  --   GLUCOSE 140* 145* 126*  --     Electrolytes  Recent Labs Lab Jul 18, 2016 1523 12/25/2015 0002 12/19/2015 0503  CALCIUM 8.4* 7.7* 7.7*  MG  --  1.1*  --   PHOS  --  4.3  --     CBC  Recent Labs Lab Jul 18, 2016 1523 01/14/2016 0002 01/03/2016 0503 12/30/2015 0812  WBC 4.7 9.0 8.2  --   HGB 9.6* 8.6* 8.7* 8.8*  HCT 28.6* 24.8* 25.8* 26.0*  PLT 195 161 169  --     Coag's  Recent Labs Lab Jul 18, 2016 2038  APTT 31  INR 1.42    Sepsis Markers No results for input(s): LATICACIDVEN, PROCALCITON, O2SATVEN in the last 168 hours.  ABG  Recent Labs Lab Jul 18, 2016 2016 01/12/2016 0812  PHART 7.394 7.380  PCO2ART 36.0 31.7*  PO2ART 402.0* 60.0*    Liver Enzymes  Recent Labs Lab 01/06/2016 0002 12/19/2015 0503  AST 26 24  ALT 12* 13*  ALKPHOS 36* 35*  BILITOT 1.1 1.2  ALBUMIN 2.4* 2.4*    Cardiac Enzymes  Recent Labs Lab 01/06/2016 0002 01/09/2016 0506  TROPONINI <0.03 <0.03    Glucose  Recent Labs Lab Jul 18, 2016 2339  GLUCAP 131*    Imaging No results found.   STUDIES:  CTA neck/chest 5/17 > Progressive retropharyngeal and prevertebral hematoma extending from the C6 fracture inferiorly into the mediastinum posterior to the airway and esophagus to at least the level of T7.  The hematoma has progressed since the earlier CT scan.  Traumatic type A aortic dissection with abnormal flow into the innominate artery. 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 There is no flow seen in the vertebral arteries or in the basilar artery. Atherosclerotic disease within the carotid bifurcations bilaterally without significant stenosis.  Evidence for remote trauma in the high cervical internal carotid arteries bilaterally. Diminished contrast within the subclavian arteries bilaterally, potentially related to decreased perfusion from the arch.  Focal narrowing of the proximal right common carotid artery likely due to extrinsic compression. CT head Cspine 5/17 > Fracture through the inferior aspect of the C6 vertebral body and as well as large anterior bridging osteophytes anteriorly at that level. Fractures are also noted through the facets and the lamina of C6.  CULTURES:   ANTIBIOTICS:   SIGNIFICANT EVENTS: 5/17 fall at home, intubated ICU  LINES/TUBES: ETT 5/17 >  DISCUSSION:   ASSESSMENT / PLAN:  PULMONARY A: Inability to protect airway due to retropharyngeal and prevertebral hematoma  P:   Full vent support ABG CXR Vent bundle  CARDIOVASCULAR A:  Hypotension > medication induced vs hemorrhage Transection of bilateral vertebral arteries Possible aortic dissection H/o HTN, HLD   P:  Telemetry monitoring SBP goal 100 - 110mmHg Esmolol gtt as needed for SBP goal Trend troponin Echo Not a surgical candidate per CVTS and vascular who are following If  needed could use short course nipride  RENAL A:   No acute issues  P:   Repeat CMP IVF hydration  GASTROINTESTINAL A:   Hx esophageal strictures  P:   NPO tonight Protonix TF in am assuming no surgery  HEMATOLOGIC A:   At risk blood loss anemia  P:  CBC q 6 hours Transfuse for hemoglobin less than 7 SCDs  INFECTIOUS A:   No acute issues  P:   Follow WBC and fever curve  ENDOCRINE A:   DM  P:   CBG monitoring and SSI  NEUROLOGIC A:   C6 fracture No flow to vertebral arteries  P:   RASS goal:-2 Propofol infusion PRN fentanyl Neurosurgery following, not a candidate for surgery.   FAMILY  - Updates: No family present, but hey have been updated by other services. Understand poor prognosis. Wish full code. WIll  need further GOC discussion and potentially palliative involvement.  - Inter-disciplinary family meet or Palliative Care meeting due by:  5/23   Joneen Roach, AGACNP-BC [facility] Pager [phone] or [phone]  01/09/2016 12:12 PM    STAFF NOTE: Cindi Carbon, MD FACP have personally reviewed patient's available data, including medical history, events of note, physical examination and test results as part of my evaluation. I have discussed with resident/NP and other care providers such as pharmacist, RN and RRT. In addition, I personally evaluated patient and elicited key findings of: sedated, neck brace in place, lungs clear, all scans reviewed, fx cervical, Aortic and vertebral injury, no surgical intervention planned, prognosis is poor and we will need comfort care discussions, for now, esmolol for BP control for shear force reduction in setting possible dissection with sys goals immediate 100-110, if needed will add nipride for no more than 24 hrs with renal fxn noted and following cyanide levels, ABg reviewed, keep same MV on vent , to goal 40% peep 5, limit benzo use with this age group, propofol tolerated thus far but if to remain on vent would switch off, aggressive care, HD, pressors, would be medicaly ineffective and futile, will need pccm to have comfort care talks today, following cbc  The patient is critically ill with multiple organ systems failure and requires high complexity decision making for assessment and support, frequent evaluation and titration of therapies, application of advanced monitoring technologies and extensive interpretation of multiple databases.   Critical Care Time devoted to patient care services described in this note is 35 Minutes. This time reflects time of care of this signee: Rory Percy, MD FACP. This critical care time does not reflect procedure time, or teaching time or supervisory time of PA/NP/Med student/Med  Resident etc but could involve care discussion time. Rest per NP/medical resident whose note is outlined above and that I agree with   Mcarthur Rossetti. Tyson Alias, MD, FACP Pgr: [phone] [facility] 01/09/2016 12:12 PM  01/31/16:   PCCM Interval Progress Note  Chart reviewed.  Pt admitted overnight following traumatic fall where he struck the side of his head.  Sustained C6 fx which was considered non-operative per neurosurgery.  Also had transection type injury to ascending aortic arch along with no flow to bilateral verterbral arteries (likey due to disruption from C6 fx injury).  While in ED, developed stridor requiring intubation. Vascular surgery and CVTS are involved and consider his injuries non-operative at this point.  Pt has been intermittently awake and following commands this AM.  He has been on esmolol gtt  overnight which has had to be stopped this AM due to hypotension (SBP in 60's).  He is currently receiving 1L bolus and SBP up to 80's.  Given the extent of his injuries which are non-operative, along with his age and co-morbidities, I had extensive discussions with pt's wife, son, and daughter this morning. We discussed Mr. Barb's current circumstances and injuries. We also discussed patient's prior wishes under circumstances such as this. The family has decided not to perform resuscitation if arrest were to occur, but to otherwise continue with current medical support / therapies.  All family questions answered.  Orders changed to full DNR but will continue to perform aggressive supportive care as able.   Rutherford Guys, [place] [facility] Pager: [phone]  or [phone] 01/09/2016, 12:13 PM   Pt eventually went asystole and no escalation of care was done as pt was DNR.  Pls see RN documentation below:   Charma Igo, Dr. Lindie Spruce, and Dr. Christene Slates notified of bradycardia and inability to palpate a pulse.  Michael and family at bedside. Family decided to not escalate care. Patient progressed to asystole with family at bedside. Londell Moh, RN and Thomes Cake, RN confirmed no heartbeat. Time of death 66. Support given to family. Chaplain called for additional support.   Addendum : discussions have been made with family. Pt now  DNR status. Poor prognosis. No surgery contemplated.    Pt was pronounced dead on 2016-01-12 at 1323. Family was at bedisde. Trauma service aware.  Pollie Meyer, MD 01/09/2016, 12:13 PM [facility] Pager [phone] After 3 pm or if no answer, call [phone]

## 2016-01-19 NOTE — Progress Notes (Signed)
CDS called. Spoke with [name]. Referral number [id number].

## 2016-01-19 NOTE — Progress Notes (Signed)
Patient arrived from ED to unit with BP 199/125. Called Trauma to clarify BP goals. Was told by Dr Magnus IvanBlackman to call CVTS for BP parameters. Called CVTS and was told that critical care needed to be involved and manage medical issues/HTN instead. Called Dr Magnus IvanBlackman back who was in the OR and busy at the time and was told to call critical care to get orders and he would call for Dr to Dr consult when he was finished.  [facility] Belia [facility]eman[facility]asa who gave orders. Esmolol gtt started for BP control. Will continue to monitor.

## 2016-01-19 NOTE — Progress Notes (Signed)
Dr. Lindie SpruceWyatt notified of decrease in blood pressure. Order for 1000cc bolus given. Order carried out. Will continue to monitor closely.

## 2016-01-19 NOTE — Progress Notes (Signed)
Patient ID: [name], male   DOB: 1924-11-30, 82 y.o.   MRN: [id number] TCTS DAILY ICU PROGRESS NOTE                   [address]            [place] [address]          [phone]        Total Length of Stay:  LOS: 1 day   Subjective: Remains on vent , was awake elierer and nurse reports following commands more all extremities   Objective: Vital signs in last 24 hours: Temp:  [97.4 F (36.3 C)-97.9 F (36.6 C)] 97.6 F (36.4 C) (05/18 0400) Pulse Rate:  [43-123] 90 (05/18 0749) Cardiac Rhythm:  [-] Normal sinus rhythm (05/18 0400) Resp:  [14-24] 22 (05/18 0749) BP: (75-204)/(47-171) 118/99 mmHg (05/18 0749) SpO2:  [55 %-100 %] 100 % (05/18 0749) FiO2 (%):  [40 %-100 %] 40 % (05/18 0749)  There were no vitals filed for this visit.  Weight change:    Hemodynamic parameters for last 24 hours:    Intake/Output from previous day: 05/17 0701 - 05/18 0700 In: 1537.6 [I.V.:1537.6] Out: 1310 [Urine:1310]  Intake/Output this shift:    Current Meds: Scheduled Meds: . antiseptic oral rinse  7 mL Mouth Rinse QID  . chlorhexidine gluconate (SAGE KIT)  15 mL Mouth Rinse BID  . pantoprazole  40 mg Oral Daily   Or  . pantoprazole (PROTONIX) IV  40 mg Intravenous Daily   Continuous Infusions: . 0.9 % NaCl with KCl 20 mEq / L 100 mL/hr at 03-Feb-2016 0700  . esmolol 80 mcg/kg/min (February 03, 2016 0700)  . propofol (DIPRIVAN) infusion 10 mcg/kg/min (02-03-16 0700)   PRN Meds:.fentaNYL (SUBLIMAZE) injection, fentaNYL (SUBLIMAZE) injection, fentaNYL (SUBLIMAZE) injection, midazolam, midazolam  General appearance: open eyes but not reliability following commands  Neurologic: as above Heart: regular rate and rhythm, S1, S2 normal, no murmur, click, rub or gallop Lungs: diminished breath sounds bibasilar Abdomen: soft, non-tender; bowel sounds normal; no masses,  no organomegaly Extremities: extremities normal, atraumatic, no cyanosis or edema Right neck less  swollen and tense   Lab Results: CBC: Recent Labs  2016-02-03 0002 February 03, 2016 0503 2016-02-03 0812  WBC 9.0 8.2  --   HGB 8.6* 8.7* 8.8*  HCT 24.8* 25.8* 26.0*  PLT 161 169  --    BMET:  Recent Labs  02-03-2016 0002 03-Feb-2016 0503 02/03/2016 0812  NA 132* 133* 135  K 4.3 4.2 4.3  CL 103 102  --   CO2 22 21*  --   GLUCOSE 145* 126*  --   BUN 28* 26*  --   CREATININE 1.74* 1.78*  --   CALCIUM 7.7* 7.7*  --     PT/INR:  Recent Labs  01/01/2016 2038  LABPROT 17.5*  INR 1.42   Radiology: Ct Head Wo Contrast  12/22/2015  CLINICAL DATA:  Recent fall with right-sided head and neck pain, initial encounter EXAM: CT HEAD WITHOUT CONTRAST CT CERVICAL SPINE WITHOUT CONTRAST TECHNIQUE: Multidetector CT imaging of the head and cervical spine was performed following the standard protocol without intravenous contrast. Multiplanar CT image reconstructions of the cervical spine were also generated. COMPARISON:  12/28/2015, 12/07/2012 FINDINGS: CT HEAD FINDINGS Bony calvarium is intact. A right frontal and supraorbital scalp hematoma is noted. No underlying fracture is seen. Mild atrophic changes are noted. Chronic white matter ischemic change is seen. No findings to suggest acute hemorrhage, acute infarction or  space-occupying mass lesion is identified. CT CERVICAL SPINE FINDINGS Seven cervical segments are well visualized. Large anterior flowing osteophytes are again seen from C2 to C7. At the C6 level there is fracture through the anterior bridging osteophytes as well as the inferior aspect of the C6 vertebral body with evidence of increased lordosis at this level. Fractures through the C6 facets noted bilaterally. This also extends into the lamina at C6 bilaterally. Surrounding soft tissues show soft tissue density at the anterior aspect of C6 consistent with local soft tissue hemorrhage. Multilevel posterior osteophytes are noted particularly at C3-4 and C5-6. These contribute to central canal stenosis at  these levels. IMPRESSION: CT of the head: No acute intracranial abnormality. Atrophy and chronic white matter ischemic changes are seen. Right frontal and supraorbital hematoma. CT of the cervical spine: Fracture through the inferior aspect of the C6 vertebral body and as well as large anterior bridging osteophytes anteriorly at that level. Fractures are also noted through the facets and the lamina of C6. Increased lordosis at this level is seen. No other fractures are noted. Critical Value/emergent results were called by telephone at the time of interpretation on 01/03/2016 at 4:23 pm to Dr. Wilson Singer, who verbally acknowledged these results. Electronically Signed   By: Inez Catalina M.D.   On: 12/31/2015 16:26   Ct Angio Neck W/cm &/or Wo/cm  12/21/2015  CLINICAL DATA:  Cervical spine fracture, initial encounter. Progressive neck swelling and intubation. EXAM: CT ANGIOGRAPHY NECK TECHNIQUE: Multidetector CT imaging of the neck was performed using the standard protocol during bolus administration of intravenous contrast. Multiplanar CT image reconstructions and MIPs were obtained to evaluate the vascular anatomy. Carotid stenosis measurements (when applicable) are obtained utilizing NASCET criteria, using the distal internal carotid diameter as the denominator. CONTRAST:  50 mL Isovue 370 COMPARISON:  CT of the head and cervical spine from the same day. FINDINGS: Aortic arch: There is diffuse regularity of contrast along the concave aspect of the aortic arch with abnormal attenuation extending into the innominate artery compatible with an acute aortic arch injury and traumatic dissection. The aortic dissection extends into the descending thoracic aorta. Right carotid system: The right common carotid artery is narrowed to 2.3 mm just beyond its origin compared with 5.9 mm more distally. This appears to be secondary to extrinsic compression. The more distal right common carotid artery is within normal limits. A  posterior calcified plaque is present at the carotid bifurcation. The cervical ICA demonstrates additional distal posterior mural plaque, likely related to remote traumatic injury. No other focal stenosis is present. Left carotid system: The left common carotid artery is within normal limits. Noncalcified plaque is seen posteriorly at the bifurcation and proximal left ICA. The lumen and is narrowed to 2.5 mm. Additional calcified and noncalcified plaque is seen in the distal left ICA, again suggesting remote trauma. Vertebral arteries:The vertebral arteries are not visualized. The fracture extends through the foramen transversarium likely reflecting dissection of the vertebral arteries bilaterally. Skeleton: The oblique fracture of the C6 vertebral body and bilateral foramen transversarium is again noted. The fracture and increased lordosis is stable. Diffuse ankylosis of the cervical spine is noted otherwise. Other neck: There is a progressive retropharyngeal and prevertebral hematoma which dissects into the superior mediastinum. The inferior extent of the hematoma is not visualized but extends inferiorly posterior to the trachea to at least the level of T7. There is partial collapse of the left lower lobe. Bilateral pleural effusions are present. There is no pneumothorax.  The visualized ribs are intact. Extensive paraspinal edema and hemorrhage in the soft tissues has increased significantly since the earlier study. IMPRESSION: 1. Progressive retropharyngeal and prevertebral hematoma extending from the C6 fracture inferiorly into the mediastinum posterior to the airway and esophagus to at least the level of T7. 2. The hematoma has progressed since the earlier CT scan. 3. Traumatic type A aortic dissection with abnormal flow into the innominate artery. 4. 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 There is no flow seen in the vertebral arteries or in the basilar artery. 5. Atherosclerotic  disease within the carotid bifurcations bilaterally without significant stenosis. 6. Evidence for remote trauma in the high cervical internal carotid arteries bilaterally. 7. Diminished contrast within the subclavian arteries bilaterally, potentially related to decreased perfusion from the arch. 8. Focal narrowing of the proximal right common carotid artery likely due to extrinsic compression. These results were called by telephone at the time of interpretation on 01/07/2016 at 8:06pm to Dr. Wilson Singer, who verbally acknowledged these results. I also discussed the case with Dr. Nedra Hai at 8:06 p.m. and Dr. Mamie Nick at 8:45 p.m. Electronically Signed   By: San Morelle M.D.   On: 01/18/2016 20:53   Ct Cervical Spine Wo Contrast  01/01/2016  CLINICAL DATA:  Recent fall with right-sided head and neck pain, initial encounter EXAM: CT HEAD WITHOUT CONTRAST CT CERVICAL SPINE WITHOUT CONTRAST TECHNIQUE: Multidetector CT imaging of the head and cervical spine was performed following the standard protocol without intravenous contrast. Multiplanar CT image reconstructions of the cervical spine were also generated. COMPARISON:  12/28/2015, 12/07/2012 FINDINGS: CT HEAD FINDINGS Bony calvarium is intact. A right frontal and supraorbital scalp hematoma is noted. No underlying fracture is seen. Mild atrophic changes are noted. Chronic white matter ischemic change is seen. No findings to suggest acute hemorrhage, acute infarction or space-occupying mass lesion is identified. CT CERVICAL SPINE FINDINGS Seven cervical segments are well visualized. Large anterior flowing osteophytes are again seen from C2 to C7. At the C6 level there is fracture through the anterior bridging osteophytes as well as the inferior aspect of the C6 vertebral body with evidence of increased lordosis at this level. Fractures through the C6 facets noted bilaterally. This also extends into the lamina at C6 bilaterally. Surrounding soft tissues  show soft tissue density at the anterior aspect of C6 consistent with local soft tissue hemorrhage. Multilevel posterior osteophytes are noted particularly at C3-4 and C5-6. These contribute to central canal stenosis at these levels. IMPRESSION: CT of the head: No acute intracranial abnormality. Atrophy and chronic white matter ischemic changes are seen. Right frontal and supraorbital hematoma. CT of the cervical spine: Fracture through the inferior aspect of the C6 vertebral body and as well as large anterior bridging osteophytes anteriorly at that level. Fractures are also noted through the facets and the lamina of C6. Increased lordosis at this level is seen. No other fractures are noted. Critical Value/emergent results were called by telephone at the time of interpretation on 12/23/2015 at 4:23 pm to Dr. Wilson Singer, who verbally acknowledged these results. Electronically Signed   By: Inez Catalina M.D.   On: 12/31/2015 16:26     Assessment/Plan: Transection of veterbral arteries bilaterial with associated c6 fracture Question of aortic dissection of arch vs artifact  - at this  point treatment is same - bp control-  Not candidate for arch replacement or complx stenting  so will note repeat cta  Discussed with trauma service  Grace Isaac January 22, 2016 8:41 AM

## 2016-01-19 NOTE — Progress Notes (Signed)
Follow up - Trauma and Critical Care  Patient Details:    [name] is an 82 y.o. male.  Lines/tubes : Airway 7.5 mm (Active)  Secured at (cm) 24 cm 01/10/2016  3:47 AM  Measured From Lips 12/27/2015  3:47 AM  Secured Location Center 12/24/2015  3:47 AM  Secured By [employer] 12/25/2015  3:47 AM  Tube Holder Repositioned Yes 12/29/2015  3:47 AM  Cuff Pressure (cm H2O) 25 cm H2O 12/22/2015  3:47 AM  Site Condition Dry 01/04/2016  3:47 AM     NG/OG Tube Orogastric 16 Fr. Left mouth (Active)  Placement Verification Auscultation Jan 11, 2016 10:15 PM  Site Assessment Clean;Dry 01-11-16 10:15 PM  Status Suction-low intermittent 01/11/16 10:15 PM  Amount of suction 115 mmHg 2016-01-11 10:15 PM  Drainage Appearance Bile 11-Jan-2016 10:15 PM     Urethral Catheter Kaitlyn Scotton Straight-tip 16 Fr. (Active)  Indication for Insertion or Continuance of Catheter Unstable critical patients (first 24-48 hours) 2016/01/11 10:15 PM  Site Assessment Clean;Intact 2016-01-11 10:15 PM  Catheter Maintenance Bag below level of bladder;Catheter secured;Drainage bag/tubing not touching floor;Insertion date on drainage bag;No dependent loops;Seal intact Jan 11, 2016 10:15 PM  Collection Container Standard drainage bag 01/11/2016 10:15 PM  Securement Method Securing device (Describe) 01/11/2016 10:15 PM  Urinary Catheter Interventions Unclamped Jan 11, 2016 10:15 PM  Output (mL) 60 mL 01/08/2016  7:00 AM    Microbiology/Sepsis markers: Results for orders placed or performed during the hospital encounter of Jan 11, 2016  MRSA PCR Screening     Status: None   Collection Time: Jan 11, 2016 11:12 PM  Result Value Ref Range Status   MRSA by PCR NEGATIVE NEGATIVE Final    Comment:        The GeneXpert MRSA Assay (FDA approved for NASAL specimens only), is one component of a comprehensive MRSA colonization surveillance program. It is not intended to diagnose MRSA infection nor to guide or monitor treatment  for MRSA infections.     Anti-infectives:  Anti-infectives    None      Best Practice/Protocols:  VTE Prophylaxis: Mechanical GI Prophylaxis: Proton Pump Inhibitor Continous Sedation Also on esmolol drip for hypertension and aortic dissection.    Consults: Treatment Team:  Shirlean Kelly, MD    Events:  Subjective:    Overnight Issues: Events of the evening noted.  This morning he is awake and alert and following commands normally.  Loss of airway in the ED make me releuctant to consider extubation today.  Objective:  Vital signs for last 24 hours: Temp:  [97.4 F (36.3 C)-97.9 F (36.6 C)] 97.6 F (36.4 C) (05/18 0400) Pulse Rate:  [43-123] 79 (05/18 0500) Resp:  [14-24] 19 (05/18 0700) BP: (75-204)/(47-171) 120/72 mmHg (05/18 0700) SpO2:  [55 %-100 %] 100 % (05/18 0700) FiO2 (%):  [40 %-100 %] 40 % (05/18 0700)  Hemodynamic parameters for last 24 hours:    Intake/Output from previous day: 05/17 0701 - 05/18 0700 In: 1537.6 [I.V.:1537.6] Out: 1310 [Urine:1310]  Intake/Output this shift:    Vent settings for last 24 hours: Vent Mode:  [-] PRVC FiO2 (%):  [40 %-100 %] 40 % Set Rate:  [14 bmp] 14 bmp Vt Set:  [500 mL] 500 mL PEEP:  [5 cmH20] 5 cmH20 Plateau Pressure:  [14 cmH20-18 cmH20] 18 cmH20  Physical Exam:  General: alert and no respiratory distress Neuro: alert, oriented and nonfocal exam Resp: clear to auscultation bilaterally CVS: regular rate and rhythm, S1, S2 normal, no murmur, click, rub or gallop and On  esmolol drip GI: soft, nontender, BS WNL, no r/g Extremities: unequal size and left ankle swelling which according to the patient is chronic.  Results for orders placed or performed during the hospital encounter of 03/08/2016 (from the past 24 hour(s))  CBC with Differential     Status: Abnormal   Collection Time: 03/08/2016  3:23 PM  Result Value Ref Range   WBC 4.7 4.0 - 10.5 K/uL   RBC 3.10 (L) 4.22 - 5.81 MIL/uL   Hemoglobin 9.6 (L)  13.0 - 17.0 g/dL   HCT 16.128.6 (L) 09.639.0 - 04.552.0 %   MCV 92.3 78.0 - 100.0 fL   MCH 31.0 26.0 - 34.0 pg   MCHC 33.6 30.0 - 36.0 g/dL   RDW 40.912.8 81.111.5 - 91.415.5 %   Platelets 195 150 - 400 K/uL   Neutrophils Relative % 74 %   Neutro Abs 3.5 1.7 - 7.7 K/uL   Lymphocytes Relative 15 %   Lymphs Abs 0.7 0.7 - 4.0 K/uL   Monocytes Relative 11 %   Monocytes Absolute 0.5 0.1 - 1.0 K/uL   Eosinophils Relative 0 %   Eosinophils Absolute 0.0 0.0 - 0.7 K/uL   Basophils Relative 0 %   Basophils Absolute 0.0 0.0 - 0.1 K/uL  Basic metabolic panel     Status: Abnormal   Collection Time: 03/08/2016  3:23 PM  Result Value Ref Range   Sodium 132 (L) 135 - 145 mmol/L   Potassium 4.6 3.5 - 5.1 mmol/L   Chloride 103 101 - 111 mmol/L   CO2 21 (L) 22 - 32 mmol/L   Glucose, Bld 140 (H) 65 - 99 mg/dL   BUN 29 (H) 6 - 20 mg/dL   Creatinine, Ser 7.821.12 0.61 - 1.24 mg/dL   Calcium 8.4 (L) 8.9 - 10.3 mg/dL   GFR calc non Af Amer 57 (L) >60 mL/min   GFR calc Af Amer >60 >60 mL/min   Anion gap 8 5 - 15  Type and screen [facility]     Status: None   Collection Time: 03/08/2016  8:03 PM  Result Value Ref Range   ABO/RH(D) A POS    Antibody Screen NEG    Sample Expiration 01/07/2016   I-Stat arterial blood gas, ED     Status: Abnormal   Collection Time: 03/08/2016  8:16 PM  Result Value Ref Range   pH, Arterial 7.394 7.350 - 7.450   pCO2 arterial 36.0 35.0 - 45.0 mmHg   pO2, Arterial 402.0 (H) 80.0 - 100.0 mmHg   Bicarbonate 21.9 20.0 - 24.0 mEq/L   TCO2 23 0 - 100 mmol/L   O2 Saturation 100.0 %   Acid-base deficit 3.0 (H) 0.0 - 2.0 mmol/L   Patient temperature 98.7 F    Collection site RADIAL, ALLEN'S TEST ACCEPTABLE    Drawn by RT    Sample type ARTERIAL   Protime-INR     Status: Abnormal   Collection Time: 03/08/2016  8:38 PM  Result Value Ref Range   Prothrombin Time 17.5 (H) 11.6 - 15.2 seconds   INR 1.42 0.00 - 1.49  APTT     Status: None   Collection Time: 03/08/2016  8:38 PM  Result Value  Ref Range   aPTT 31 24 - 37 seconds  ABO/Rh     Status: None   Collection Time: 03/08/2016  8:38 PM  Result Value Ref Range   ABO/RH(D) A POS   MRSA PCR Screening     Status: None  Collection Time: 01/02/2016 11:12 PM  Result Value Ref Range   MRSA by PCR NEGATIVE NEGATIVE  Glucose, capillary     Status: Abnormal   Collection Time: 12/19/2015 11:39 PM  Result Value Ref Range   Glucose-Capillary 131 (H) 65 - 99 mg/dL  Triglycerides     Status: None   Collection Time: 01/27/2016 12:02 AM  Result Value Ref Range   Triglycerides 35 <150 mg/dL  CBC     Status: Abnormal   Collection Time: Jan 27, 2016 12:02 AM  Result Value Ref Range   WBC 9.0 4.0 - 10.5 K/uL   RBC 2.69 (L) 4.22 - 5.81 MIL/uL   Hemoglobin 8.6 (L) 13.0 - 17.0 g/dL   HCT 16.1 (L) 09.6 - 04.5 %   MCV 92.2 78.0 - 100.0 fL   MCH 32.0 26.0 - 34.0 pg   MCHC 34.7 30.0 - 36.0 g/dL   RDW 40.9 81.1 - 91.4 %   Platelets 161 150 - 400 K/uL  Comprehensive metabolic panel     Status: Abnormal   Collection Time: 2016/01/27 12:02 AM  Result Value Ref Range   Sodium 132 (L) 135 - 145 mmol/L   Potassium 4.3 3.5 - 5.1 mmol/L   Chloride 103 101 - 111 mmol/L   CO2 22 22 - 32 mmol/L   Glucose, Bld 145 (H) 65 - 99 mg/dL   BUN 28 (H) 6 - 20 mg/dL   Creatinine, Ser 7.82 (H) 0.61 - 1.24 mg/dL   Calcium 7.7 (L) 8.9 - 10.3 mg/dL   Total Protein 4.9 (L) 6.5 - 8.1 g/dL   Albumin 2.4 (L) 3.5 - 5.0 g/dL   AST 26 15 - 41 U/L   ALT 12 (L) 17 - 63 U/L   Alkaline Phosphatase 36 (L) 38 - 126 U/L   Total Bilirubin 1.1 0.3 - 1.2 mg/dL   GFR calc non Af Amer 33 (L) >60 mL/min   GFR calc Af Amer 39 (L) >60 mL/min   Anion gap 7 5 - 15  Troponin I     Status: None   Collection Time: 01/27/16 12:02 AM  Result Value Ref Range   Troponin I <0.03 <0.031 ng/mL  Magnesium     Status: Abnormal   Collection Time: 27-Jan-2016 12:02 AM  Result Value Ref Range   Magnesium 1.1 (L) 1.7 - 2.4 mg/dL  Phosphorus     Status: None   Collection Time: 01-27-16 12:02 AM   Result Value Ref Range   Phosphorus 4.3 2.5 - 4.6 mg/dL  CBC     Status: Abnormal   Collection Time: 01/27/16  5:03 AM  Result Value Ref Range   WBC 8.2 4.0 - 10.5 K/uL   RBC 2.81 (L) 4.22 - 5.81 MIL/uL   Hemoglobin 8.7 (L) 13.0 - 17.0 g/dL   HCT 95.6 (L) 21.3 - 08.6 %   MCV 91.8 78.0 - 100.0 fL   MCH 31.0 26.0 - 34.0 pg   MCHC 33.7 30.0 - 36.0 g/dL   RDW 57.8 46.9 - 62.9 %   Platelets 169 150 - 400 K/uL  Comprehensive metabolic panel     Status: Abnormal   Collection Time: 27-Jan-2016  5:03 AM  Result Value Ref Range   Sodium 133 (L) 135 - 145 mmol/L   Potassium 4.2 3.5 - 5.1 mmol/L   Chloride 102 101 - 111 mmol/L   CO2 21 (L) 22 - 32 mmol/L   Glucose, Bld 126 (H) 65 - 99 mg/dL   BUN 26 (H) 6 -  20 mg/dL   Creatinine, Ser 1.61 (H) 0.61 - 1.24 mg/dL   Calcium 7.7 (L) 8.9 - 10.3 mg/dL   Total Protein 5.0 (L) 6.5 - 8.1 g/dL   Albumin 2.4 (L) 3.5 - 5.0 g/dL   AST 24 15 - 41 U/L   ALT 13 (L) 17 - 63 U/L   Alkaline Phosphatase 35 (L) 38 - 126 U/L   Total Bilirubin 1.2 0.3 - 1.2 mg/dL   GFR calc non Af Amer 32 (L) >60 mL/min   GFR calc Af Amer 38 (L) >60 mL/min   Anion gap 10 5 - 15  Troponin I     Status: None   Collection Time: 01/10/2016  5:06 AM  Result Value Ref Range   Troponin I <0.03 <0.031 ng/mL     Assessment/Plan:   NEURO  Altered Mental Status:  sedation and considering earlier reports the patient looks fantastic, following commands.   Plan: Wean sedation and allow weaning even if we do not extubate  PULM  Some left sided atelectasis which is minimal, but will check ABG for functional status.   Plan: ABG, wean,   CARDIO  No specific issues   Plan: CPM  RENAL  No specific issues  Has some chronic renal insufficiency likely   Plan: CPM  GI  No specific issues   Plan: Tube feedings in the near future  ID  No infectious sources noted   Plan: CPM  HEME  Anemia acute blood loss anemia and anemia of chronic disease)   Plan: Probably chronic anemia also  ENDO  No known issues   Plan: CPM  Global Issues  Initially admitted after a ground level fall with a C-spine injury without neurological deficits.  Lost his airway in the ED and had to be intubated.  Subsequently found to have an aortic dissection and bilateral vertebral artery transection leading to neck hematoma and loss of airway.  His mental status was questionable earlier, but currently the patient is completely awake and alert and following commands.  With the exception of airway protection, could probably be extubated.    LOS: 1 day   Additional comments:I reviewed the patient's new clinical lab test results. cbc/bmet and I reviewed the patients new imaging test results. cxr  Critical Care Total Time*: 30 Minutes  Zakiya Sporrer 01/07/2016  *Care during the described time interval was provided by me and/or other providers on the critical care team.  I have reviewed this patient's available data, including medical history, events of note, physical examination and test results as part of my evaluation.

## 2016-01-19 NOTE — Progress Notes (Signed)
Subjective: Patient continues on ventilator via ETT.  Chart reviewed, and CTA results noted. Patient immobilized in Aspen cervical collar.  Objective: Vital signs in last 24 hours: Filed Vitals:   01-07-2016 0645 01-07-16 0700 07-Jan-2016 0749 01/07/2016 0800  BP: 113/71 120/72 118/99 121/94  Pulse:   90   Temp:    98.5 F (36.9 C)  TempSrc:    Axillary  Resp: SpO2:  100% 100% 100%    Intake/Output from previous day: 05/17 0701 - 05/18 0700 In: 1537.6 [I.V.:1537.6] Out: 1310 [Urine:1310] Intake/Output this shift: Total I/O In: 171.1 [I.V.:171.1] Out: -   Physical Exam:  Opens eyes to voice. Left pupil 1.5 mm, right pupil 2 mm.  Moving all 4 extremities to command.  CBC  Recent Labs  Jan 07, 2016 0002 07-Jan-2016 0503 2016-01-07 0812  WBC 9.0 8.2  --   HGB 8.6* 8.7* 8.8*  HCT 24.8* 25.8* 26.0*  PLT 161 169  --    BMET  Recent Labs  2016-01-07 0002 Jan 07, 2016 0503 07-Jan-2016 0812  NA 132* 133* 135  K 4.3 4.2 4.3  CL 103 102  --   CO2 22 21*  --   GLUCOSE 145* 126*  --   BUN 28* 26*  --   CREATININE 1.74* 1.78*  --   CALCIUM 7.7* 7.7*  --    ABG    Component Value Date/Time   PHART 7.380 Jan 07, 2016 0812   PCO2ART 31.7* 01/07/2016 0812   PO2ART 60.0* Jan 07, 2016 0812   HCO3 18.8* 01-07-16 0812   TCO2 20 Jan 07, 2016 0812   ACIDBASEDEF 6.0* 2016/01/07 0812   O2SAT 90.0 Jan 07, 2016 0812    Studies/Results: Dg Shoulder Right  12/26/2015  CLINICAL DATA:  Acute right shoulder pain after fall at home today. Initial encounter. EXAM: RIGHT SHOULDER - 2+ VIEW COMPARISON:  None. FINDINGS: There is no evidence of fracture or dislocation. There is no evidence of arthropathy or other focal bone abnormality. Soft tissues are unremarkable. IMPRESSION: No significant abnormality seen in the right shoulder. Electronically Signed   By: Lupita Raider, M.D.   On: 12/28/2015 16:02   Ct Head Wo Contrast  01/08/2016  CLINICAL DATA:  Recent fall with right-sided head and neck pain,  initial encounter EXAM: CT HEAD WITHOUT CONTRAST CT CERVICAL SPINE WITHOUT CONTRAST TECHNIQUE: Multidetector CT imaging of the head and cervical spine was performed following the standard protocol without intravenous contrast. Multiplanar CT image reconstructions of the cervical spine were also generated. COMPARISON:  12/28/2015, 12/07/2012 FINDINGS: CT HEAD FINDINGS Bony calvarium is intact. A right frontal and supraorbital scalp hematoma is noted. No underlying fracture is seen. Mild atrophic changes are noted. Chronic white matter ischemic change is seen. No findings to suggest acute hemorrhage, acute infarction or space-occupying mass lesion is identified. CT CERVICAL SPINE FINDINGS Seven cervical segments are well visualized. Large anterior flowing osteophytes are again seen from C2 to C7. At the C6 level there is fracture through the anterior bridging osteophytes as well as the inferior aspect of the C6 vertebral body with evidence of increased lordosis at this level. Fractures through the C6 facets noted bilaterally. This also extends into the lamina at C6 bilaterally. Surrounding soft tissues show soft tissue density at the anterior aspect of C6 consistent with local soft tissue hemorrhage. Multilevel posterior osteophytes are noted particularly at C3-4 and C5-6. These contribute to central canal stenosis at these levels. IMPRESSION: CT of the head: No acute intracranial abnormality. Atrophy and chronic white matter ischemic  changes are seen. Right frontal and supraorbital hematoma. CT of the cervical spine: Fracture through the inferior aspect of the C6 vertebral body and as well as large anterior bridging osteophytes anteriorly at that level. Fractures are also noted through the facets and the lamina of C6. Increased lordosis at this level is seen. No other fractures are noted. Critical Value/emergent results were called by telephone at the time of interpretation on 01/12/2016 at 4:23 pm to Dr. Juleen China, who  verbally acknowledged these results. Electronically Signed   By: Alcide Clever M.D.   On: 01/03/2016 16:26   Ct Angio Neck W/cm &/or Wo/cm  12/20/2015  CLINICAL DATA:  Cervical spine fracture, initial encounter. Progressive neck swelling and intubation. EXAM: CT ANGIOGRAPHY NECK TECHNIQUE: Multidetector CT imaging of the neck was performed using the standard protocol during bolus administration of intravenous contrast. Multiplanar CT image reconstructions and MIPs were obtained to evaluate the vascular anatomy. Carotid stenosis measurements (when applicable) are obtained utilizing NASCET criteria, using the distal internal carotid diameter as the denominator. CONTRAST:  50 mL Isovue 370 COMPARISON:  CT of the head and cervical spine from the same day. FINDINGS: Aortic arch: There is diffuse regularity of contrast along the concave aspect of the aortic arch with abnormal attenuation extending into the innominate artery compatible with an acute aortic arch injury and traumatic dissection. The aortic dissection extends into the descending thoracic aorta. Right carotid system: The right common carotid artery is narrowed to 2.3 mm just beyond its origin compared with 5.9 mm more distally. This appears to be secondary to extrinsic compression. The more distal right common carotid artery is within normal limits. A posterior calcified plaque is present at the carotid bifurcation. The cervical ICA demonstrates additional distal posterior mural plaque, likely related to remote traumatic injury. No other focal stenosis is present. Left carotid system: The left common carotid artery is within normal limits. Noncalcified plaque is seen posteriorly at the bifurcation and proximal left ICA. The lumen and is narrowed to 2.5 mm. Additional calcified and noncalcified plaque is seen in the distal left ICA, again suggesting remote trauma. Vertebral arteries:The vertebral arteries are not visualized. The fracture extends through the  foramen transversarium likely reflecting dissection of the vertebral arteries bilaterally. Skeleton: The oblique fracture of the C6 vertebral body and bilateral foramen transversarium is again noted. The fracture and increased lordosis is stable. Diffuse ankylosis of the cervical spine is noted otherwise. Other neck: There is a progressive retropharyngeal and prevertebral hematoma which dissects into the superior mediastinum. The inferior extent of the hematoma is not visualized but extends inferiorly posterior to the trachea to at least the level of T7. There is partial collapse of the left lower lobe. Bilateral pleural effusions are present. There is no pneumothorax. The visualized ribs are intact. Extensive paraspinal edema and hemorrhage in the soft tissues has increased significantly since the earlier study. IMPRESSION: 1. Progressive retropharyngeal and prevertebral hematoma extending from the C6 fracture inferiorly into the mediastinum posterior to the airway and esophagus to at least the level of T7. 2. The hematoma has progressed since the earlier CT scan. 3. Traumatic type A aortic dissection with abnormal flow into the innominate artery. 4. 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 There is no flow seen in the vertebral arteries or in the basilar artery. 5. Atherosclerotic disease within the carotid bifurcations bilaterally without significant stenosis. 6. Evidence for remote trauma in the high cervical internal carotid arteries bilaterally. 7. Diminished contrast within the  subclavian arteries bilaterally, potentially related to decreased perfusion from the arch. 8. Focal narrowing of the proximal right common carotid artery likely due to extrinsic compression. These results were called by telephone at the time of interpretation on 12/28/2015 at 8:06pm to Dr. Juleen China, who verbally acknowledged these results. I also discussed the case with Dr. Carman Ching at 8:06 p.m. and Dr. Jerilee Field at 8:45 p.m. Electronically Signed   By: Marin Roberts M.D.   On: 12/25/2015 20:53   Ct Cervical Spine Wo Contrast  01/10/2016  CLINICAL DATA:  Recent fall with right-sided head and neck pain, initial encounter EXAM: CT HEAD WITHOUT CONTRAST CT CERVICAL SPINE WITHOUT CONTRAST TECHNIQUE: Multidetector CT imaging of the head and cervical spine was performed following the standard protocol without intravenous contrast. Multiplanar CT image reconstructions of the cervical spine were also generated. COMPARISON:  12/28/2015, 12/07/2012 FINDINGS: CT HEAD FINDINGS Bony calvarium is intact. A right frontal and supraorbital scalp hematoma is noted. No underlying fracture is seen. Mild atrophic changes are noted. Chronic white matter ischemic change is seen. No findings to suggest acute hemorrhage, acute infarction or space-occupying mass lesion is identified. CT CERVICAL SPINE FINDINGS Seven cervical segments are well visualized. Large anterior flowing osteophytes are again seen from C2 to C7. At the C6 level there is fracture through the anterior bridging osteophytes as well as the inferior aspect of the C6 vertebral body with evidence of increased lordosis at this level. Fractures through the C6 facets noted bilaterally. This also extends into the lamina at C6 bilaterally. Surrounding soft tissues show soft tissue density at the anterior aspect of C6 consistent with local soft tissue hemorrhage. Multilevel posterior osteophytes are noted particularly at C3-4 and C5-6. These contribute to central canal stenosis at these levels. IMPRESSION: CT of the head: No acute intracranial abnormality. Atrophy and chronic white matter ischemic changes are seen. Right frontal and supraorbital hematoma. CT of the cervical spine: Fracture through the inferior aspect of the C6 vertebral body and as well as large anterior bridging osteophytes anteriorly at that level. Fractures are also noted through the facets and the lamina  of C6. Increased lordosis at this level is seen. No other fractures are noted. Critical Value/emergent results were called by telephone at the time of interpretation on 01/03/2016 at 4:23 pm to Dr. Juleen China, who verbally acknowledged these results. Electronically Signed   By: Alcide Clever M.D.   On: 01/11/2016 16:26   Dg Chest Port 1 View  Jan 12, 2016  CLINICAL DATA:  Acute cervical spine fracture, acute respiratory failure with intubation EXAM: PORTABLE CHEST 1 VIEW COMPARISON:  Portable chest x-ray of Jan 04, 2016 FINDINGS: The lungs are adequately inflated. There is increased density in the retrocardiac region today. There is no pleural effusion. The heart is normal in size. The pulmonary vascularity is not engorged. The endotracheal tube tip lies approximately 5.7 cm above the carina. The esophagogastric tube tip projects below the inferior margin of the image. IMPRESSION: Developing left lower lobe subsegmental atelectasis in the retrocardiac region. Otherwise stable chest x-ray. The support tubes are in reasonable position. Electronically Signed   By: David  [place] M.D.   On: 01/12/2016 07:55   Dg Chest Port 1 View  01/14/2016  CLINICAL DATA:  Endotracheal tube placement, diabetes mellitus, hypertension, hyperlipidemia, history colon cancer EXAM: PORTABLE CHEST 1 VIEW COMPARISON:  Portable exam 1838 hours compared to 12/28/2015 FINDINGS: Tip of endotracheal tube projects approximately 8.1 cm above carina, at the level of clavicular heads. Lordotic  positioning. Stable heart size and pulmonary vascularity. Accentuation of superior mediastinum may be related to lordosis. Skin fold projects over LEFT chest. Lungs grossly clear without infiltrate, pleural effusion or pneumothorax. Osseous demineralization with BILATERAL glenohumeral degenerative changes. IMPRESSION: Tip of endotracheal tube projects at clavicular heads approximately 8.1 cm above carina. Clear lungs. Electronically Signed   By: Ulyses SouthwardMark  Boles M.D.    On: 06/29/2016 18:44   Dg Ankle Left Port  01/09/2016  CLINICAL DATA:  Left ankle swelling EXAM: PORTABLE LEFT ANKLE - 2 VIEW COMPARISON:  None. FINDINGS: Three views of the left ankle submitted. No acute fracture or subluxation. Diffuse soft tissue swelling is noted around the ankle. Posterior spurring of calcaneus. Mild degenerative changes tibiotalar joint. Small plantar spur of calcaneus. Ankle mortise is preserved. IMPRESSION: No acute fracture or subluxation. Soft tissue swelling around the ankle. Plantar and posterior spurring of calcaneus. Mild degenerative changes tibiotalar joint. Electronically Signed   By: Natasha MeadLiviu  Pop M.D.   On: 12/28/2015 08:01    Assessment/Plan: Continued immobilization in a Aspen cervical collar. Continued care per trauma surgical service, CCM service, CVTS service, etc.   Hewitt ShortsNUDELMAN,ROBERT W, MD 12/29/2015, 10:50 AM

## 2016-01-19 NOTE — Progress Notes (Signed)
   02-02-2016 1349  Clinical Encounter Type  Visited With Family;Health care provider  Visit Type Initial;Death;Spiritual support  Referral From Nurse  Spiritual Encounters  Spiritual Needs Prayer;Grief support;Emotional  Stress Factors  Family Stress Factors Loss   Chaplain responded to a death on 3MW. Chaplain met with patient's family and offered them support and prayer. Chaplain services available as needed.   Jeri Lager, Chaplain 02-02-2016 1:51 PM

## 2016-01-19 NOTE — Progress Notes (Signed)
Initial Nutrition Assessment  DOCUMENTATION CODES:   Severe malnutrition in context of chronic illness  INTERVENTION:  If nutrition support within GOC, recommend initiation of tube feeding via OGT with Vital AF 1.2 @ 25 ml/hr and increase by 10 ml every 12 hours to goal rate of 55 ml/hr (1320 ml/d) to provide 1584 kcals and 99 grams of protein (meets 100% needs).  NUTRITION DIAGNOSIS:   Inadequate oral intake related to inability to eat as evidenced by NPO status.  GOAL:   Patient will meet greater than or equal to 90% of their needs  MONITOR:   Vent status, Labs, Weight trends, Skin, I & O's  REASON FOR ASSESSMENT:   Ventilator    ASSESSMENT:   Pt with past medical history of diabetes, hypertension, esophageal stricture, renal insufficiency, and colon cancer. Suffered a fall at home, struck his head. He did not lose consciousness. Initially his mental status was intact until he developed respiratory stridor and shortness of breath requiring intubation. Fractured C6 that is non operative.    Weight loss of 8.5% in past year, this is not significant for time frame.   Pt remains intubated on vent.  Temp (24hrs), Avg:97.8 F (36.6 C), Min:97.4 F (36.3 C), Max:98.5 F (36.9 C) Ve: 12.8 ml  Per chart, now DNR, continuing current medical support/therapies. Tube feeding recommendations provided if nutrition support is within GOC.   NFPE: Severe fat depletion, severe muscle depletion, no edema.  Labs reviewed; Phos/K WNL, Low mag (1.1), Ca 1.1, CBGs 131, creat 7.7, GFR 32, BUN 26.  Meds reviewed; KCl  Diet Order:  Diet NPO time specified  Skin:  Reviewed, no issues  Last BM:  5/12  Height:   Ht Readings from Last 1 Encounters:  12/29/15 5\' 5"  (1.651 m)    Weight:   Wt Readings from Last 1 Encounters:  12/31/15 129 lb 3.2 oz (58.605 kg)    Ideal Body Weight:  61.8 kg  BMI:  There is no weight on file to calculate BMI.  Estimated Nutritional Needs:   Kcal:   1473  Protein:  93   Fluid:  Per MD  EDUCATION NEEDS:   No education needs identified at this time  Beryle QuantMeredith Kemaya Dorner, MS [facility] Dietetic Intern Pager [phone]

## 2016-01-19 NOTE — Progress Notes (Signed)
Dr. Lindie SpruceWyatt and CCM notified of continued hypotension despite 1000cc bolus. Another 1000cc bolus ordered. Order carried out. Will continue to monitor closely.

## 2016-01-19 NOTE — Progress Notes (Signed)
Charma IgoMichael Jeffery, Dr. Lindie SpruceWyatt, and Dr. Christene Slatese Dios notified of bradycardia and inability to palpate a pulse. Michael and family at bedside. Family decided to not escalate care. Patient progressed to asystole with family at bedside. Londell MohKaty Walton, RN and Thomes CakeMari Adler Chartrand, RN confirmed no heartbeat. Time of death [id number]. Support given to family. Chaplain called for additional support.

## 2016-01-19 DEATH — deceased
